# Patient Record
Sex: Female | Born: 1950
Health system: Southern US, Community
[De-identification: ages and names within clinical notes are randomized; demographics above are authoritative.]

## PROBLEM LIST (undated history)

## (undated) DIAGNOSIS — A86 Unspecified viral encephalitis: Secondary | ICD-10-CM

## (undated) DIAGNOSIS — R519 Headache, unspecified: Secondary | ICD-10-CM

## (undated) DIAGNOSIS — A879 Viral meningitis, unspecified: Secondary | ICD-10-CM

## (undated) DIAGNOSIS — R569 Unspecified convulsions: Secondary | ICD-10-CM

## (undated) DIAGNOSIS — I10 Essential (primary) hypertension: Secondary | ICD-10-CM

## (undated) DIAGNOSIS — R51 Headache: Secondary | ICD-10-CM

## (undated) HISTORY — PX: ABDOMINAL HYSTERECTOMY: SHX81

## (undated) HISTORY — DX: Viral meningitis, unspecified: A87.9

## (undated) HISTORY — DX: Unspecified viral encephalitis: A86

---

## 2013-01-23 ENCOUNTER — Encounter (HOSPITAL_BASED_OUTPATIENT_CLINIC_OR_DEPARTMENT_OTHER): Payer: Self-pay | Admitting: Emergency Medicine

## 2013-01-23 ENCOUNTER — Inpatient Hospital Stay (HOSPITAL_BASED_OUTPATIENT_CLINIC_OR_DEPARTMENT_OTHER)
Admission: EM | Admit: 2013-01-23 | Discharge: 2013-02-01 | DRG: 020 | Disposition: A | Discharge status: Home / Self Care | Payer: BC Managed Care – PPO | Attending: Internal Medicine | Admitting: Internal Medicine

## 2013-01-23 ENCOUNTER — Emergency Department (HOSPITAL_BASED_OUTPATIENT_CLINIC_OR_DEPARTMENT_OTHER): Payer: BC Managed Care – PPO

## 2013-01-23 DIAGNOSIS — R569 Unspecified convulsions: Secondary | ICD-10-CM | POA: Diagnosis present

## 2013-01-23 DIAGNOSIS — IMO0002 Reserved for concepts with insufficient information to code with codable children: Secondary | ICD-10-CM

## 2013-01-23 DIAGNOSIS — G030 Nonpyogenic meningitis: Secondary | ICD-10-CM

## 2013-01-23 DIAGNOSIS — K029 Dental caries, unspecified: Secondary | ICD-10-CM | POA: Diagnosis present

## 2013-01-23 DIAGNOSIS — G0490 Encephalitis and encephalomyelitis, unspecified: Principal | ICD-10-CM | POA: Diagnosis present

## 2013-01-23 DIAGNOSIS — A86 Unspecified viral encephalitis: Secondary | ICD-10-CM | POA: Diagnosis present

## 2013-01-23 DIAGNOSIS — E876 Hypokalemia: Secondary | ICD-10-CM | POA: Diagnosis present

## 2013-01-23 DIAGNOSIS — R509 Fever, unspecified: Secondary | ICD-10-CM | POA: Diagnosis present

## 2013-01-23 DIAGNOSIS — A879 Viral meningitis, unspecified: Secondary | ICD-10-CM

## 2013-01-23 DIAGNOSIS — R636 Underweight: Secondary | ICD-10-CM

## 2013-01-23 HISTORY — DX: Unspecified convulsions: R56.9

## 2013-01-23 HISTORY — DX: Headache: R51

## 2013-01-23 HISTORY — DX: Headache, unspecified: R51.9

## 2013-01-23 LAB — CBC WITH DIFFERENTIAL/PLATELET
Basophils Absolute: 0 10*3/uL (ref 0.0–0.1)
Eosinophils Absolute: 0 10*3/uL (ref 0.0–0.7)
Lymphs Abs: 1.5 10*3/uL (ref 0.7–4.0)
MCHC: 34.1 g/dL (ref 30.0–36.0)
MCV: 89.3 fL (ref 78.0–100.0)
Monocytes Relative: 10 % (ref 3–12)
Neutro Abs: 4.3 10*3/uL (ref 1.7–7.7)
Platelets: 288 10*3/uL (ref 150–400)
RDW: 13.5 % (ref 11.5–15.5)
WBC: 6.4 10*3/uL (ref 4.0–10.5)

## 2013-01-23 LAB — URINALYSIS, ROUTINE W REFLEX MICROSCOPIC
Bilirubin Urine: NEGATIVE
Specific Gravity, Urine: 1.043 — ABNORMAL HIGH (ref 1.005–1.030)
pH: 5.5 (ref 5.0–8.0)

## 2013-01-23 LAB — BASIC METABOLIC PANEL
BUN: 10 mg/dL (ref 6–23)
Calcium: 9.9 mg/dL (ref 8.4–10.5)
Creatinine, Ser: 0.5 mg/dL (ref 0.50–1.10)
GFR calc Af Amer: >90 mL/min (ref >90)
GFR calc non Af Amer: >90 mL/min (ref >90)
Glucose, Bld: 126 mg/dL — ABNORMAL HIGH (ref 70–99)
Potassium: 2.9 mEq/L — ABNORMAL LOW (ref 3.5–5.1)

## 2013-01-23 LAB — URINE MICROSCOPIC-ADD ON

## 2013-01-23 LAB — CG4 I-STAT (LACTIC ACID): Lactic Acid, Venous: 1.96 mmol/L (ref 0.5–2.2)

## 2013-01-23 MED ORDER — SODIUM CHLORIDE 0.9 % IV BOLUS (SEPSIS)
1000.0000 mL | Freq: Once | INTRAVENOUS | Status: AC
  Administered 2013-01-23: 1000 mL via INTRAVENOUS

## 2013-01-23 MED ORDER — LIDOCAINE-EPINEPHRINE 2 %-1:100000 IJ SOLN: 30.0000 mL | Freq: Once | INTRAMUSCULAR | Status: DC

## 2013-01-23 MED ORDER — ACETAMINOPHEN 160 MG/5ML PO SOLN
650.0000 mg | Freq: Once | ORAL | Status: AC
  Administered 2013-01-23: 650 mg via ORAL

## 2013-01-23 MED ORDER — ACETAMINOPHEN 325 MG PO TABS
650.0000 mg | ORAL_TABLET | Freq: Once | ORAL | Status: DC
  Filled 2013-01-23: qty 2

## 2013-01-23 MED ORDER — ACETAMINOPHEN 160 MG/5ML PO SOLN
ORAL | Status: AC
  Filled 2013-01-23: qty 20.3

## 2013-01-23 MED ORDER — DEXTROSE 5 % IV SOLN
2.0000 g | Freq: Once | INTRAVENOUS | Status: AC
  Administered 2013-01-23: 2 g via INTRAVENOUS
  Filled 2013-01-23 (×2): qty 2

## 2013-01-23 MED ORDER — LIDOCAINE-EPINEPHRINE 2 %-1:100000 IJ SOLN
INTRAMUSCULAR | Status: AC
  Filled 2013-01-23: qty 1

## 2013-01-23 NOTE — ED Provider Notes (Signed)
CSN: [id number]     Arrival date & time 01/23/13  2052 History  This chart was scribed for Hilario Quarry, MD by Valera Castle, ED scribe. This patient was seen in room MH08/MH08 and the patient's care was started at 10:15 PM.   Chief Complaint  Patient presents with  . Fever    Patient is a 62 y.o. female presenting with fever. The history is provided by the patient and a relative. No language interpreter was used.  Fever Max temp prior to arrival:  103 Onset quality:  Sudden Duration:  1 day Timing:  Constant Chronicity:  New Associated symptoms: diarrhea and nausea   Associated symptoms: no congestion, no cough, no ear pain, no sore throat and no vomiting    HPI Comments: Taylor Wood is a 62 y.o. female who presents to the Emergency Department complaining of sudden, moderate, constant fever, onset 1 day ago, with a max temperature of 103. She reports associated headache, nausea, and diarrhea. She has a h/o of headaches, but states that the pain has increased since the fever. States she has been eating and drinking. Her daughter reports that she had a new onset of  seizure last Friday, and was seen at [facility].  She was admitted to the hospital.  She did not have a fever at that time.   She states they performed a CT, MRI, and other neuro tests. She denies the pt having a catheter, or a lumbar puncture. She was started on keppra.  She denies being in contact with anyone who was sick. She denies cough, emesis, sinus congestion, sore throat, ear pain, and any other associated symptoms. Pt states she has not been having her periods. She denies diabetes, and any other medical history. She has no known allergies, and does not smoke, drink. She does not have a PCP.    History reviewed. No pertinent past medical history. History reviewed. No pertinent past surgical history. No family history on file. History  Substance Use Topics  . Smoking status: Never Smoker   . Smokeless tobacco: Not on file  .  Alcohol Use: No   OB History   Grav Para Term Preterm Abortions TAB SAB Ect Mult Living                 Review of Systems  Constitutional: Positive for fever.  HENT: Negative for ear pain, congestion and sore throat.   Respiratory: Negative for cough.   Gastrointestinal: Positive for nausea and diarrhea. Negative for vomiting.  All other systems reviewed and are negative.    Allergies  Review of patient's allergies indicates no known allergies.  Home Medications   Current Outpatient Rx  Name  Route  Sig  Dispense  Refill  . acetaminophen (TYLENOL) 325 MG tablet   Oral   Take 650 mg by mouth once.         . levETIRAcetam (KEPPRA) 750 MG tablet   Oral   Take 750 mg by mouth every 12 (twelve) hours.          Triage Vitals: BP 152/70  Pulse 103  Temp(Src) 103.1 F (39.5 C) (Oral)  Resp 18  Ht 5' (1.524 m)  Wt 99 lb 1.6 oz (44.951 kg)  BMI 19.35 kg/m2  SpO2 98%  Physical Exam  Nursing note and vitals reviewed. Constitutional: She is oriented to person, place, and time. She appears well-developed and well-nourished. No distress.  HENT:  Head: Normocephalic and atraumatic.  Right Ear: Tympanic membrane and external  ear normal.  Left Ear: Tympanic membrane and external ear normal.  Nose: Nose normal. Right sinus exhibits no maxillary sinus tenderness and no frontal sinus tenderness. Left sinus exhibits no maxillary sinus tenderness and no frontal sinus tenderness.  Eyes: Conjunctivae and EOM are normal. Pupils are equal, round, and reactive to light. Right eye exhibits no nystagmus. Left eye exhibits no nystagmus.  Neck: Normal range of motion. Neck supple. No tracheal deviation present. No thyromegaly present.  Cardiovascular: Normal heart sounds and intact distal pulses.  Tachycardia present.   Pulmonary/Chest: Effort normal and breath sounds normal. No respiratory distress. She exhibits no tenderness.  Abdominal: Soft. Bowel sounds are normal. She exhibits no  distension and no mass. There is no tenderness.  Musculoskeletal: Normal range of motion. She exhibits no edema and no tenderness.  Neurological: She is alert and oriented to person, place, and time. She has normal strength and normal reflexes. No sensory deficit. She displays a negative Romberg sign. GCS eye subscore is 4. GCS verbal subscore is 5. GCS motor subscore is 6.  Reflex Scores:      Tricep reflexes are 2+ on the right side and 2+ on the left side.      Bicep reflexes are 2+ on the right side and 2+ on the left side.      Brachioradialis reflexes are 2+ on the right side and 2+ on the left side.      Patellar reflexes are 2+ on the right side and 2+ on the left side.      Achilles reflexes are 2+ on the right side and 2+ on the left side. Patient with normal gait without ataxia, shuffling, spasm, or antalgia. Speech is normal without dysarthria, dysphasia, or aphasia. Muscle strength is 5/5 in bilateral shoulders, elbow flexor and extensors, wrist flexor and extensors, and intrinsic hand muscles. 5/5 bilateral lower extremity hip flexors, extensors, knee flexors and extensors, and ankle dorsi and plantar flexors.    Skin: Skin is warm and dry. No rash noted.  Psychiatric: She has a normal mood and affect. Her behavior is normal. Judgment and thought content normal.    ED Course  LUMBAR PUNCTURE Date/Time: 01/23/2013 11:44 PM Performed by: Hilario Quarry Authorized by: Hilario Quarry Consent: written consent obtained. Risks and benefits: risks, benefits and alternatives were discussed Consent given by: patient Patient understanding: patient states understanding of the procedure being performed Patient consent: the patient's understanding of the procedure matches consent given Patient identity confirmed: verbally with patient Time out: Immediately prior to procedure a "time out" was called to verify the correct patient, procedure, equipment, support staff and site/side marked as  required. Indications: evaluation for infection Anesthesia: local infiltration Local anesthetic: lidocaine 1% with epinephrine Patient sedated: no Preparation: Patient was prepped and draped in the usual sterile fashion. Lumbar space: L3-L4 interspace Patient's position: left lateral decubitus Needle gauge: 22 Needle type: spinal needle - Quincke tip Needle length: 3.5 in Number of attempts: 2 Fluid appearance: clear Tubes of fluid: 4 Total volume: 8 ml Post-procedure: site cleaned Patient tolerance: Patient tolerated the procedure well with no immediate complications.   (including critical care time)  DIAGNOSTIC STUDIES: Oxygen Saturation is 98% on room air, normal by my interpretation.    COORDINATION OF CARE: 10:25 PM-Discussed treatment plan which includes UA, and tylenol with pt at bedside and pt agreed to plan.      Labs Review Labs Reviewed  URINALYSIS, ROUTINE W REFLEX MICROSCOPIC - Abnormal; Notable for the following:  Specific Gravity, Urine 1.043 (*)    Hgb urine dipstick SMALL (*)    Protein, ur 30 (*)    Leukocytes, UA SMALL (*)    All other components within normal limits  URINE MICROSCOPIC-ADD ON   Imaging Review No results found.  MDM  No diagnosis found. Patient to have work up for fever including lp.  Patient treated with ns and rocephin 2 gram iv.  Patient with first lactic acid normal.  Discussed with Dr. Judd Lien and he will follow up labs and csf results.  Patient and family advised.     Hilario Quarry, MD 01/23/13 [phone]

## 2013-01-23 NOTE — ED Notes (Signed)
MD at bedside giving bedside report to oncoming MD.

## 2013-01-23 NOTE — ED Notes (Signed)
Family states pt with fever, nausea, and diarrhea today. Family states pt had a seizure last fri (NO hx of seizures) was seen at [facility] for that.

## 2013-01-24 ENCOUNTER — Encounter (HOSPITAL_COMMUNITY): Payer: Self-pay | Admitting: *Deleted

## 2013-01-24 DIAGNOSIS — A86 Unspecified viral encephalitis: Secondary | ICD-10-CM | POA: Diagnosis present

## 2013-01-24 DIAGNOSIS — E876 Hypokalemia: Secondary | ICD-10-CM | POA: Diagnosis present

## 2013-01-24 DIAGNOSIS — R569 Unspecified convulsions: Secondary | ICD-10-CM | POA: Diagnosis present

## 2013-01-24 DIAGNOSIS — A89 Unspecified viral infection of central nervous system: Secondary | ICD-10-CM

## 2013-01-24 LAB — CSF CELL COUNT WITH DIFFERENTIAL
Eosinophils, CSF: 0 % (ref 0–1)
Lymphs, CSF: 74 % (ref 40–80)
Lymphs, CSF: 78 % (ref 40–80)
Monocyte-Macrophage-Spinal Fluid: 20 % (ref 15–45)
RBC Count, CSF: 2 /mm3 — ABNORMAL HIGH
Segmented Neutrophils-CSF: 6 % (ref 0–6)
Tube #: 4
WBC, CSF: 163 /mm3 (ref 0–5)

## 2013-01-24 LAB — HERPES SIMPLEX VIRUS(HSV) DNA BY PCR
HSV 1 DNA: NOT DETECTED
HSV 2 DNA: NOT DETECTED

## 2013-01-24 LAB — PROTEIN AND GLUCOSE, CSF: Total  Protein, CSF: 78 mg/dL — ABNORMAL HIGH (ref 15–45)

## 2013-01-24 LAB — PATHOLOGIST SMEAR REVIEW

## 2013-01-24 MED ORDER — LEVETIRACETAM 100 MG/ML PO SOLN
750.0000 mg | Freq: Two times a day (BID) | ORAL | Status: DC
  Administered 2013-01-24 – 2013-02-01 (×17): 750 mg via ORAL
  Filled 2013-01-24 (×19): qty 7.5

## 2013-01-24 MED ORDER — LEVETIRACETAM 750 MG PO TABS
750.0000 mg | ORAL_TABLET | Freq: Two times a day (BID) | ORAL | Status: DC
  Filled 2013-01-24 (×3): qty 1

## 2013-01-24 MED ORDER — DEXTROSE 5 % IV SOLN
5.0000 mg/kg | Freq: Once | INTRAVENOUS | Status: AC
  Administered 2013-01-24: 225 mg via INTRAVENOUS
  Filled 2013-01-24: qty 10

## 2013-01-24 MED ORDER — SODIUM CHLORIDE 0.9 % IV SOLN: INTRAVENOUS | Status: AC

## 2013-01-24 MED ORDER — POTASSIUM CHLORIDE 20 MEQ/15ML (10%) PO LIQD
40.0000 meq | Freq: Once | ORAL | Status: AC
  Administered 2013-01-24: 16:00:00 40 meq via ORAL
  Filled 2013-01-24: qty 30

## 2013-01-24 MED ORDER — ACETAMINOPHEN 325 MG PO TABS
650.0000 mg | ORAL_TABLET | Freq: Four times a day (QID) | ORAL | Status: DC | PRN
  Administered 2013-01-24: 09:00:00 650 mg via ORAL
  Filled 2013-01-24: qty 2

## 2013-01-24 MED ORDER — IBUPROFEN 200 MG PO TABS
200.0000 mg | ORAL_TABLET | Freq: Four times a day (QID) | ORAL | Status: DC | PRN
  Administered 2013-01-24 – 2013-01-25 (×2): 200 mg via ORAL
  Filled 2013-01-24 (×4): qty 1

## 2013-01-24 MED ORDER — IBUPROFEN 100 MG/5ML PO SUSP
600.0000 mg | Freq: Once | ORAL | Status: AC
  Administered 2013-01-24: 600 mg via ORAL
  Filled 2013-01-24: qty 30

## 2013-01-24 MED ORDER — ACETAMINOPHEN 160 MG/5ML PO SOLN
650.0000 mg | Freq: Four times a day (QID) | ORAL | Status: DC | PRN
  Administered 2013-01-24 – 2013-02-01 (×16): 650 mg via ORAL
  Filled 2013-01-24 (×15): qty 20.3

## 2013-01-24 MED ORDER — POTASSIUM CHLORIDE 10 MEQ/100ML IV SOLN
10.0000 meq | Freq: Once | INTRAVENOUS | Status: DC
  Filled 2013-01-24: qty 100

## 2013-01-24 MED ORDER — POTASSIUM CHLORIDE CRYS ER 20 MEQ PO TBCR: 40.0000 meq | EXTENDED_RELEASE_TABLET | Freq: Once | ORAL | Status: DC

## 2013-01-24 MED ORDER — HEPARIN SODIUM (PORCINE) 5000 UNIT/ML IJ SOLN
5000.0000 [IU] | Freq: Three times a day (TID) | INTRAMUSCULAR | Status: DC
  Administered 2013-01-24 – 2013-02-01 (×24): 5000 [IU] via SUBCUTANEOUS
  Filled 2013-01-24 (×28): qty 1

## 2013-01-24 MED ORDER — DEXTROSE 5 % IV SOLN
10.0000 mg/kg | Freq: Three times a day (TID) | INTRAVENOUS | Status: DC
  Administered 2013-01-24 – 2013-01-25 (×4): 460 mg via INTRAVENOUS
  Filled 2013-01-24 (×5): qty 9.2

## 2013-01-24 NOTE — ED Provider Notes (Signed)
Care syndrome Dr. Rosalia Hammers at shift change. Patient initially presented here with headache and fever. Febrile workup was initiated including blood cultures, urine cultures, and chest x-ray. These tests thus far have been unremarkable. An LP was performed and the patient was signed out to me awaiting these results. Spinal fluid returned showing a white count of 163 suggestive of a Viral meningitis. There were no organisms on the Gram stain and differential did not suggest a bacterial etiology. I discussed the results of these tests with Dr. Amada Jupiter from neurology who is recommending admission for IV acyclovir and observation. I have discussed the case with Dr. Julian Reil from [facility] who agrees to admit. She will be transferred to come for inpatient care and further evaluation. I have updated the patient and family on the results of these tests in the disposition.  Geoffery Lyons, MD 01/24/13 [phone]

## 2013-01-24 NOTE — ED Notes (Signed)
MD at bedside. 

## 2013-01-24 NOTE — Progress Notes (Signed)
Patient seen earlier this am by my associate. Please refer to his HPI for details regarding Assessment and Plan.    Will reassess next am. Patient seen and had no new complaints.  Bernie Ransford, [employer]

## 2013-01-24 NOTE — ED Notes (Signed)
Dr. Judd Lien is aware of critical CSF results.

## 2013-01-24 NOTE — Consult Note (Signed)
NEURO HOSPITALIST CONSULT NOTE    Reason for Consult: Meningitis and eizure  HPI:                                                                                                                                          Taylor Wood is an 62 y.o. female who presented to [place] complaining of fever. The patient recently was admitted to [facility] after a new onset seizure last Friday, at that time she did have low grade fevers but no signs of meningitis or infection. She was started on keppra and reportedly had a negative EEG and  MRI. Patient was brought back to hospital after noting elevated temperature and HA. Temperature is reordered as high as 103.1. CSF findings in ED show glucose 68, protein 78, RBC 8,WBC (108,163), neutrophils (6,8), Lymphs (74,78), Monocytes (20,14), no eosinophils. Both CSF and blood cultures are pending. PAtient has been started on IV Acyclovir per pharmacy. She is is currently on Keppra 750 mg BID with no further seizures.   Patients daughter does have seizure disorder since 62 YO ( no history of trauma, normal vaginal birth and no febrile seizure). Patient herself has no family history of seizure, was normal vaginal birth and no history of febrile seizure.   Currently patient is Afebrile, awake, has no HA or meningismus.   Past Medical History  Diagnosis Date  . Seizure   . HA (headache)     History reviewed. No pertinent past surgical history.  Family History  Problem Relation Age of Onset  . Hypertension Mother   . Hypertension Father     Social History:  reports that she has never smoked. She does not have any smokeless tobacco history on file. She reports that she does not drink alcohol or use illicit drugs.  No Known Allergies  MEDICATIONS:                                                                                                                     Scheduled: . sodium chloride   Intravenous STAT  . acyclovir  10 mg/kg Intravenous  Q8H  . heparin  5,000 Units Subcutaneous Q8H  . levETIRAcetam  750 mg Oral BID  . lidocaine-EPINEPHrine  30 mL Other Once  . potassium chloride  10 mEq Intravenous Once     ROS:                                                                                                                                       History obtained from the patient  General ROS: negative for - chills, fatigue, fever, night sweats, weight gain or weight loss Psychological ROS: negative for - behavioral disorder, hallucinations, memory difficulties, mood swings or suicidal ideation Ophthalmic ROS: negative for - blurry vision, double vision, eye pain or loss of vision ENT ROS: negative for - epistaxis, nasal discharge, oral lesions, sore throat, tinnitus or vertigo Allergy and Immunology ROS: negative for - hives or itchy/watery eyes Hematological and Lymphatic ROS: negative for - bleeding problems, bruising or swollen lymph nodes Endocrine ROS: negative for - galactorrhea, hair pattern changes, polydipsia/polyuria or temperature intolerance Respiratory ROS: negative for - cough, hemoptysis, shortness of breath or wheezing Cardiovascular ROS: negative for - chest pain, dyspnea on exertion, edema or irregular heartbeat Gastrointestinal ROS: negative for - abdominal pain, diarrhea, hematemesis, nausea/vomiting or stool incontinence Genito-Urinary ROS: negative for - dysuria, hematuria, incontinence or urinary frequency/urgency Musculoskeletal ROS: negative for - joint swelling or muscular weakness Neurological ROS: as noted in HPI Dermatological ROS: negative for rash and skin lesion changes   Blood pressure 137/74, pulse 87, temperature 99.5 F (37.5 C), temperature source Oral, resp. rate 18, height 5' (1.524 m), weight 45.8 kg (100 lb 15.5 oz), SpO2 100.00%.   Neurologic Examination:                                                                                                      Mental Status: Alert,  oriented, thought content appropriate.  Speech fluent without evidence of aphasia.  Able to follow 3 step commands without difficulty. Cranial Nerves: II: Discs flat bilaterally; Visual fields grossly normal, pupils equal, round, reactive to light and accommodation III,IV, VI: ptosis not present, extra-ocular motions intact bilaterally V,VII: smile symmetric, facial light touch sensation normal bilaterally VIII: hearing normal bilaterally IX,X: gag reflex present XI: bilateral shoulder shrug XII: midline tongue extension Motor: Right : Upper extremity   5/5    Left:     Upper extremity   5/5  Lower extremity   5/5     Lower extremity   5/5 Tone and bulk:normal tone throughout; no atrophy noted Sensory: Pinprick and light touch intact throughout, bilaterally Deep Tendon Reflexes:  Right: Upper Extremity   Left: Upper extremity  biceps (C-5 to C-6) 2/4   biceps (C-5 to C-6) 2/4 tricep (C7) 2/4    triceps (C7) 2/4 Brachioradialis (C6) 2/4  Brachioradialis (C6) 2/4  Lower Extremity Lower Extremity  quadriceps (L-2 to L-4) 1/4   quadriceps (L-2 to L-4) 1/4 Achilles (S1) 1/4   Achilles (S1) 1/4  Plantars: Right: downgoing   Left: downgoing Cerebellar: normal finger-to-nose,  normal heel-to-shin test Gait: not assessed CV: pulses palpable throughout    No components found with this basename: cbc,  bmp,  coags,  chol,  tri,  ldl,  hga1c    Results for orders placed during the hospital encounter of 01/23/13 (from the past 48 hour(s))  URINALYSIS, ROUTINE W REFLEX MICROSCOPIC     Status: Abnormal   Collection Time    01/23/13  9:07 PM      Result Value Range   Color, Urine YELLOW  YELLOW   APPearance CLEAR  CLEAR   Specific Gravity, Urine 1.043 (*) 1.005 - 1.030   pH 5.5  5.0 - 8.0   Glucose, UA NEGATIVE  NEGATIVE mg/dL   Hgb urine dipstick SMALL (*) NEGATIVE   Bilirubin Urine NEGATIVE  NEGATIVE   Ketones, ur NEGATIVE  NEGATIVE mg/dL   Protein, ur 30 (*) NEGATIVE mg/dL    Urobilinogen, UA 0.2  0.0 - 1.0 mg/dL   Nitrite NEGATIVE  NEGATIVE   Leukocytes, UA SMALL (*) NEGATIVE  URINE MICROSCOPIC-ADD ON     Status: None   Collection Time    01/23/13  9:07 PM      Result Value Range   Squamous Epithelial / LPF RARE  RARE   WBC, UA 3-6  <3 WBC/hpf   RBC / HPF 3-6  <3 RBC/hpf   Bacteria, UA RARE  RARE   Urine-Other MUCOUS PRESENT    CBC WITH DIFFERENTIAL     Status: None   Collection Time    01/23/13 11:22 PM      Result Value Range   WBC 6.4  4.0 - 10.5 K/uL   Comment: WHITE COUNT CONFIRMED ON SMEAR   RBC 4.13  3.87 - 5.11 MIL/uL   Hemoglobin 12.6  12.0 - 15.0 g/dL   HCT 16.1  09.6 - 04.5 %   MCV 89.3  78.0 - 100.0 fL   MCH 30.5  26.0 - 34.0 pg   MCHC 34.1  30.0 - 36.0 g/dL   RDW 40.9  81.1 - 91.4 %   Platelets 288  150 - 400 K/uL   Comment: SPECIMEN CHECKED FOR CLOTS     PLATELET COUNT CONFIRMED BY SMEAR   Neutrophils Relative % 67  43 - 77 %   Lymphocytes Relative 23  12 - 46 %   Monocytes Relative 10  3 - 12 %   Eosinophils Relative 0  0 - 5 %   Basophils Relative 0  0 - 1 %   Neutro Abs 4.3  1.7 - 7.7 K/uL   Lymphs Abs 1.5  0.7 - 4.0 K/uL   Monocytes Absolute 0.6  0.1 - 1.0 K/uL   Eosinophils Absolute 0.0  0.0 - 0.7 K/uL   Basophils Absolute 0.0  0.0 - 0.1 K/uL   WBC Morphology WHITE COUNT CONFIRMED ON SMEAR     Smear Review LARGE PLATELETS PRESENT     Comment: PLATELET COUNT CONFIRMED BY SMEAR  BASIC METABOLIC PANEL     Status: Abnormal   Collection Time    01/23/13 11:22 PM      Result Value Range  Sodium 136  135 - 145 mEq/L   Potassium 2.9 (*) 3.5 - 5.1 mEq/L   Chloride 97  96 - 112 mEq/L   CO2 25  19 - 32 mEq/L   Glucose, Bld 126 (*) 70 - 99 mg/dL   BUN 10  6 - 23 mg/dL   Creatinine, Ser 1.61  0.50 - 1.10 mg/dL   Calcium 9.9  8.4 - 09.6 mg/dL   GFR calc non Af Amer >90  >90 mL/min   GFR calc Af Amer >90  >90 mL/min   Comment: (NOTE)     The eGFR has been calculated using the CKD EPI equation.     This calculation has not  been validated in all clinical situations.     eGFR's persistently <90 mL/min signify possible Chronic Kidney     Disease.  CSF CELL COUNT WITH DIFFERENTIAL     Status: Abnormal   Collection Time    01/23/13 11:30 PM      Result Value Range   Tube # 1     Color, CSF COLORLESS  COLORLESS   Appearance, CSF CLEAR  CLEAR   Supernatant NOT INDICATED     RBC Count, CSF 8 (*) 0 /cu mm   WBC, CSF 163 (*) 0 - 5 /cu mm   Comment: CRITICAL RESULT CALLED TO, READ BACK BY AND VERIFIED WITH:     Talmage Nap RN [phone] [address], S   Segmented Neutrophils-CSF 6  0 - 6 %   Lymphs, CSF 74  40 - 80 %   Monocyte-Macrophage-Spinal Fluid 20  15 - 45 %   Eosinophils, CSF 0  0 - 1 %   Comment: Performed at [facility]  CSF CULTURE     Status: None   Collection Time    01/23/13 11:30 PM      Result Value Range   Specimen Description CSF     Special Requests Normal     Gram Stain       Value: WBC PRESENT, PREDOMINANTLY MONONUCLEAR     NO ORGANISMS SEEN     CYTOSPIN     Performed at [facility]   Culture PENDING     Report Status PENDING    PROTEIN AND GLUCOSE, CSF     Status: Abnormal   Collection Time    01/23/13 11:30 PM      Result Value Range   Glucose, CSF 68  43 - 76 mg/dL   Total  Protein, CSF 78 (*) 15 - 45 mg/dL  CSF CELL COUNT WITH DIFFERENTIAL     Status: Abnormal   Collection Time    01/23/13 11:30 PM      Result Value Range   Tube # 4     Color, CSF COLORLESS  COLORLESS   Appearance, CSF CLEAR  CLEAR   Supernatant NOT INDICATED     RBC Count, CSF 2 (*) 0 /cu mm   WBC, CSF 108 (*) 0 - 5 /cu mm   Comment: CRITICAL RESULT CALLED TO, READ BACK BY AND VERIFIED WITH:     Talmage Nap RN [phone] [address], S   Segmented Neutrophils-CSF 8 (*) 0 - 6 %   Lymphs, CSF 78  40 - 80 %   Monocyte-Macrophage-Spinal Fluid 14 (*) 15 - 45 %   Eosinophils, CSF 0  0 - 1 %   Comment: Performed at [facility]  CG4 I-STAT (LACTIC ACID)     Status: None  Collection Time    01/23/13 11:35 PM      Result Value Range   Lactic Acid, Venous 1.96  0.5 - 2.2 mmol/L    Dg Chest 2 View  01/24/2013   CLINICAL DATA:  Headache.  EXAM: CHEST  2 VIEW  COMPARISON:  CHEST x-ray 01/19/2013.  FINDINGS: Lung volumes are normal. No consolidative airspace disease. No pleural effusions. No pneumothorax. No pulmonary nodule or mass noted. Pulmonary vasculature and the cardiomediastinal silhouette are within normal limits. Atherosclerosis in the thoracic aorta.  IMPRESSION: 1.  No radiographic evidence of acute cardiopulmonary disease. 2. Atherosclerosis.   Electronically Signed   By: Trudie Reed M.D.   On: 01/24/2013 01:03     Assessment/Plan: 62 YO female with single new onset seizure 6 days ago now presenting with likely viral meningitis by LP results.  Patient has had no further seizure while being on Keppra. She is currently  On Acyclovir IV per pharmacy and remains to have a LG fever. Exam is nonfocal and shows no signs of meningismus. Both blood and CSF cultures pending (preliminary shows no growth).   Recommend: 1) Continue Keppra 750 mg BID 2) Continue Acyclovir  Assessment and plan discussed with with attending physician and they are in agreement.    Felicie Morn PA-C [facility] [phone]  01/24/2013, 9:26 AM  I personally participate in this patient's evaluation and management including formulating the above clinical assessment and management recommendations.  Venetia Maxon M.D. [facility] [phone]

## 2013-01-24 NOTE — Plan of Care (Signed)
62 yo F with seizure on Friday, was at [place], work up including EEG and MRI was negative.  Spiked fever today, got LP in ED and appears to be a viral meningitis, Dr. Amada Jupiter recommended acyclovir and inpatient stay for a few days in case this was HSV.  Accepted to floor.

## 2013-01-24 NOTE — Progress Notes (Signed)
61yo female c/o HA and fever, LP performed, returned w/ WBC 163, concerning for viral meningitis, to begin IV acyclovir.  Rec'd acyclovir 5mg /kg in ED; will start acyclovir 10mg /kg Q8H for CrCl ~55 ml/min and monitor.  Vernard Gambles, PharmD, BCPS 01/24/2013 6:11 AM

## 2013-01-24 NOTE — H&P (Signed)
[facility] History and Physical  Taylor Wood JXB:[id number] DOB: 05/16/1950 DOA: 01/23/2013  Referring physician: ED PCP: No PCP Per Patient   Chief Complaint: Fever  HPI: Clovis Warwick is a 62 y.o. female who presents to the ED complaining of fever.  The patient was initially seen and admitted to [facility] after a seizure last Friday (no PMH of seizures before this), she did have low grade fevers during that hospital stay but nothing outstanding.  She was started on keppra during that hospital stay.  Work up during that hospital stay included a reportedly negative EEG and negative MRI.  Today she was noted to have a fever as high as 103.1.  There is associated headache, nausea, and some diarrhea.  Headache was worse with the fever (and resolved now that fever has resolved).  There is NO meningismus despite the CSF findings in the ED.  In the ED LP was c/w viral meningitis (despite no meningismus) as the likely source of her fever.  Patient was started on HSV for possible viral encephalitis and transferred to [facility].  Review of Systems: Denies tick bites, denies rash, 12 systems reviewed and otherwise negative.  History reviewed. No pertinent past medical history. History reviewed. No pertinent past surgical history. Social History:  reports that she has never smoked. She does not have any smokeless tobacco history on file. She reports that she does not drink alcohol or use illicit drugs.  No Known Allergies  History reviewed. No pertinent family history.  Prior to Admission medications   Medication Sig Start Date End Date Taking? Authorizing Provider  acetaminophen (TYLENOL) 325 MG tablet Take 650 mg by mouth once.   Yes Historical Provider, MD  levETIRAcetam (KEPPRA) 750 MG tablet Take 750 mg by mouth every 12 (twelve) hours.   Yes Historical Provider, MD   Physical Exam: Filed Vitals:   01/24/13 0540  BP: 137/74  Pulse: 87  Temp: 99.5 F (37.5 C)  Resp: 18    General:  NAD, resting  comfortably in bed Eyes: PEERLA EOMI ENT: mucous membranes moist Neck: supple w/o JVD, no meningismus Cardiovascular: RRR w/o MRG Respiratory: CTA B Abdomen: soft, nt, nd, bs+ Skin: no rash nor lesion Musculoskeletal: MAE, full ROM all 4 extremities Psychiatric: normal tone and affect Neurologic: AAOx3, grossly non-focal  Labs on Admission:  Basic Metabolic Panel:  Recent Labs Lab 01/23/13 2322  NA 136  K 2.9*  CL 97  CO2 25  GLUCOSE 126*  BUN 10  CREATININE 0.50  CALCIUM 9.9   Liver Function Tests: No results found for this basename: AST, ALT, ALKPHOS, BILITOT, PROT, ALBUMIN,  in the last 168 hours No results found for this basename: LIPASE, AMYLASE,  in the last 168 hours No results found for this basename: AMMONIA,  in the last 168 hours CBC:  Recent Labs Lab 01/23/13 2322  WBC 6.4  NEUTROABS 4.3  HGB 12.6  HCT 36.9  MCV 89.3  PLT 288   Cardiac Enzymes: No results found for this basename: CKTOTAL, CKMB, CKMBINDEX, TROPONINI,  in the last 168 hours  BNP (last 3 results) No results found for this basename: PROBNP,  in the last 8760 hours CBG: No results found for this basename: GLUCAP,  in the last 168 hours  Radiological Exams on Admission: Dg Chest 2 View  01/24/2013   CLINICAL DATA:  Headache.  EXAM: CHEST  2 VIEW  COMPARISON:  CHEST x-ray 01/19/2013.  FINDINGS: Lung volumes are normal. No consolidative airspace disease. No pleural effusions.  No pneumothorax. No pulmonary nodule or mass noted. Pulmonary vasculature and the cardiomediastinal silhouette are within normal limits. Atherosclerosis in the thoracic aorta.  IMPRESSION: 1.  No radiographic evidence of acute cardiopulmonary disease. 2. Atherosclerosis.   Electronically Signed   By: Trudie Reed M.D.   On: 01/24/2013 01:03    EKG: Independently reviewed.  Assessment/Plan Principal Problem:   Viral encephalitis Active Problems:   Seizures   1. Viral encephalitis - patient actually denies  headache and denies meningismus at this time, LP was most c/w viral pathology with only mildly elevated protein, and mildly elevated WBC with a clear lymphocyte predominance (only 8% segs).  Will continue Acyclovir pharm to dose in case this is HSV, have no evidence or suspicion at this point of bacterial meningitis (patient has no meningismus, there is a 6 day course of the illness and patient appears non-toxic at this time, the LP does not really support typical bacterial infectious pathology).  Could also be any number of other viruses other than HSV that is causing this (ie enterovirus, WNV or other arbovirus although NOT EEE as she is no where near sick enough).  Will continue to provide supportive care in addition to acyclovir and monitor patient. 2. Seizures - leaving patient on keppra during acute illness, unlikely to require this permanently as her MRI, EEG were both negative and only 1 seizure episode.    Code Status: 12 systems reviewed and otherwise negative (must indicate code status--if unknown or must be presumed, indicate so) Family Communication: Spoke with family at bedside (indicate person spoken with, if applicable, with phone number if by telephone) Disposition Plan: Admit to inpatient (indicate anticipated LOS)  Time spent: 70 min  GARDNER, JARED M. [facility] Pager [phone]  If 7PM-7AM, please contact night-coverage [email or web address] Password [facility] 01/24/2013, 6:43 AM

## 2013-01-24 NOTE — ED Notes (Addendum)
Pt lost consciousness while in radiology having cxr done.  Was caught by staff so she did not fall.  When pt placed back on the stretcher she woke up and at this time is back in the room and states she feels fine.  MD aware of LOC episode.

## 2013-01-25 DIAGNOSIS — E876 Hypokalemia: Secondary | ICD-10-CM

## 2013-01-25 DIAGNOSIS — A879 Viral meningitis, unspecified: Secondary | ICD-10-CM

## 2013-01-25 LAB — BASIC METABOLIC PANEL
BUN: 6 mg/dL (ref 6–23)
Chloride: 100 mEq/L (ref 96–112)
GFR calc Af Amer: >90 mL/min (ref >90)
Glucose, Bld: 113 mg/dL — ABNORMAL HIGH (ref 70–99)
Potassium: 3.2 mEq/L — ABNORMAL LOW (ref 3.5–5.1)

## 2013-01-25 LAB — CBC
HCT: 32 % — ABNORMAL LOW (ref 36.0–46.0)
Hemoglobin: 10.9 g/dL — ABNORMAL LOW (ref 12.0–15.0)
WBC: 4.8 10*3/uL (ref 4.0–10.5)

## 2013-01-25 LAB — MAGNESIUM: Magnesium: 2.1 mg/dL (ref 1.5–2.5)

## 2013-01-25 MED ORDER — IBUPROFEN 100 MG/5ML PO SUSP
200.0000 mg | Freq: Four times a day (QID) | ORAL | Status: DC | PRN
  Administered 2013-01-25 – 2013-01-26 (×4): 200 mg via ORAL
  Filled 2013-01-25 (×4): qty 10

## 2013-01-25 MED ORDER — PNEUMOCOCCAL VAC POLYVALENT 25 MCG/0.5ML IJ INJ: 0.5000 mL | INJECTION | INTRAMUSCULAR | Status: DC

## 2013-01-25 MED ORDER — PNEUMOCOCCAL VAC POLYVALENT 25 MCG/0.5ML IJ INJ
0.5000 mL | INJECTION | INTRAMUSCULAR | Status: DC
  Filled 2013-01-25: qty 0.5

## 2013-01-25 MED ORDER — POTASSIUM CHLORIDE CRYS ER 20 MEQ PO TBCR
40.0000 meq | EXTENDED_RELEASE_TABLET | Freq: Once | ORAL | Status: AC
  Administered 2013-01-25: 40 meq via ORAL
  Filled 2013-01-25: qty 2

## 2013-01-25 MED ORDER — INFLUENZA VAC SPLIT QUAD 0.5 ML IM SUSP: 0.5000 mL | INTRAMUSCULAR | Status: DC

## 2013-01-25 MED ORDER — INFLUENZA VAC SPLIT QUAD 0.5 ML IM SUSP
0.5000 mL | Freq: Once | INTRAMUSCULAR | Status: DC
  Filled 2013-01-25: qty 0.5

## 2013-01-25 NOTE — Progress Notes (Addendum)
NEURO HOSPITALIST PROGRESS NOTE   SUBJECTIVE:                                                                                                                        Patient has slight HA and is associated with elevated Temperature.  Otherwise no further issues and no further seizures.   OBJECTIVE:                                                                                                                           Vital signs in last 24 hours: Temp:  [99.5 F (37.5 C)-103.1 F (39.5 C)] 101.1 F (38.4 C) (09/19 0512) Pulse Rate:  [86-99] 86 (09/19 0512) Resp:  [16-18] 16 (09/19 0512) BP: (102-126)/(38-77) 116/42 mmHg (09/19 0512) SpO2:  [98 %-99 %] 99 % (09/19 0512) Weight:  [46.8 kg (103 lb 2.8 oz)] 46.8 kg (103 lb 2.8 oz) (09/18 2035)  Intake/Output from previous day: 09/18 0701 - 09/19 0700 In: 940 [P.O.:940] Out: -  Intake/Output this shift:   Nutritional status: General  Past Medical History  Diagnosis Date  . Seizure   . HA (headache)      Neurologic Exam:  Mental Status: Alert, oriented, thought content appropriate.  Speech fluent without evidence of aphasia.  Able to follow 3 step commands without difficulty. Cranial Nerves: II: Visual fields grossly normal, pupils equal, round, reactive to light and accommodation III,IV, VI: ptosis not present, extra-ocular motions intact bilaterally V,VII: smile symmetric, facial light touch sensation normal bilaterally VIII: hearing normal bilaterally IX,X: gag reflex present XI: bilateral shoulder shrug XII: midline tongue extension Motor: Right : Upper extremity   5/5    Left:     Upper extremity   5/5  Lower extremity   5/5     Lower extremity   5/5 Tone and bulk:normal tone throughout; no atrophy noted Sensory: Pinprick and light touch intact throughout, bilaterally Deep Tendon Reflexes:  Right: Upper Extremity   Left: Upper extremity   biceps (C-5 to C-6) 2/4   biceps (C-5  to C-6) 2/4 tricep (C7) 2/4    triceps (C7) 2/4 Brachioradialis (C6) 2/4  Brachioradialis (C6) 2/4  Lower Extremity Lower Extremity  quadriceps (L-2 to L-4) 2/4   quadriceps (  L-2 to L-4) 2/4 Achilles (S1) 2/4   Achilles (S1) 2/4  Plantars: Right: downgoing   Left: downgoing    Lab Results: No results found for this basename: cbc, bmp, coags, chol, tri, ldl, hga1c   Lipid Panel No results found for this basename: CHOL, TRIG, HDL, CHOLHDL, VLDL, LDLCALC,  in the last 72 hours  Studies/Results: Dg Chest 2 View  01/24/2013   CLINICAL DATA:  Headache.  EXAM: CHEST  2 VIEW  COMPARISON:  CHEST x-ray 01/19/2013.  FINDINGS: Lung volumes are normal. No consolidative airspace disease. No pleural effusions. No pneumothorax. No pulmonary nodule or mass noted. Pulmonary vasculature and the cardiomediastinal silhouette are within normal limits. Atherosclerosis in the thoracic aorta.  IMPRESSION: 1.  No radiographic evidence of acute cardiopulmonary disease. 2. Atherosclerosis.   Electronically Signed   By: Trudie Reed M.D.   On: 01/24/2013 01:03    MEDICATIONS                                                                                                                        Scheduled: . acyclovir  10 mg/kg Intravenous Q8H  . heparin  5,000 Units Subcutaneous Q8H  . levETIRAcetam  750 mg Oral BID  . lidocaine-EPINEPHrine  30 mL Other Once  . potassium chloride  10 mEq Intravenous Once    ASSESSMENT/PLAN:                                                                                                            62 YO female with single new onset seizure 6 days ago now presenting with likely viral meningitis by LP results.  Exam remains nonfocal and shows no signs of meningismus. Recent HSV PCR negative.  At this time:  Recommend:  1) Contue Keppra 750 mg BID while in hospital and at time of discharge 2) HSV PCR (-) and may D/C acyclovir, continue to treat HA and fever  symptomatically 3) Patient will need to follow up with out patient neurologist 4)No driving, operating heavy machinery, perform activities at heights, swimming or participation in water activities until release by outpatient physician.  This has been discussed with patient.    Assessment and plan discussed with with attending physician and they are in agreement.    Felicie Morn PA-C [facility] [phone]  01/25/2013, 9:24 AM

## 2013-01-25 NOTE — Progress Notes (Signed)
[facility] PROGRESS NOTE  Anamae Rochelle WJX:[id number] DOB: 12-03-1950 DOA: 01/23/2013 PCP: No PCP Per Patient  Assessment/Plan: 1. Viral encephalitis - HSV PCR negative as such acyclovir d/c'd - Plans are to treat symptomatically - Still spiking fevers of 103 today as such agree with monitoring until fevers subside or ameliorate.   2. Seizure - Per neurology: continuing kepra 750 mg po bid while in hospital  3. Hypokalemia - will check magnesium level - replaced yesterday  Code Status: full Family Communication: Discussed with patient and family member at bedside Disposition Plan: With cessation or amelioration of fevers   Consultants:  Neurology  Procedures:  LP on admission  Antibiotics:  none  HPI/Subjective: No new complaints, no acute issues reported overnight.   Objective: Filed Vitals:   01/25/13 1226  BP:   Pulse:   Temp: 101.9 F (38.8 C)  Resp:     Intake/Output Summary (Last 24 hours) at 01/25/13 1324 Last data filed at 01/25/13 0900  Gross per 24 hour  Intake    580 ml  Output      0 ml  Net    580 ml   Filed Weights   01/23/13 2057 01/24/13 0540 01/24/13 2035  Weight: 44.951 kg (99 lb 1.6 oz) 45.8 kg (100 lb 15.5 oz) 46.8 kg (103 lb 2.8 oz)    Exam:   General:  Pt in NAD, Alert and awake  Cardiovascular: RRR, no MRG  Respiratory: CTA BL, no wheezes  Abdomen: soft, NT, ND  Musculoskeletal: no cyanosis or clubbing  Data Reviewed: Basic Metabolic Panel:  Recent Labs Lab 01/23/13 2322 01/25/13 0624  NA 136 135  K 2.9* 3.2*  CL 97 100  CO2 25 24  GLUCOSE 126* 113*  BUN 10 6  CREATININE 0.50 0.55  CALCIUM 9.9 9.0   Liver Function Tests: No results found for this basename: AST, ALT, ALKPHOS, BILITOT, PROT, ALBUMIN,  in the last 168 hours No results found for this basename: LIPASE, AMYLASE,  in the last 168 hours No results found for this basename: AMMONIA,  in the last 168 hours CBC:  Recent Labs Lab  01/23/13 2322 01/25/13 0624  WBC 6.4 4.8  NEUTROABS 4.3  --   HGB 12.6 10.9*  HCT 36.9 32.0*  MCV 89.3 89.9  PLT 288 259   Cardiac Enzymes: No results found for this basename: CKTOTAL, CKMB, CKMBINDEX, TROPONINI,  in the last 168 hours BNP (last 3 results) No results found for this basename: PROBNP,  in the last 8760 hours CBG: No results found for this basename: GLUCAP,  in the last 168 hours  Recent Results (from the past 240 hour(s))  CULTURE, BLOOD (ROUTINE X 2)     Status: None   Collection Time    01/23/13 11:15 PM      Result Value Range Status   Specimen Description BLOOD LEFT ANTECUBITAL   Final   Special Requests     Final   Value: BOTTLES DRAWN AEROBIC AND ANAEROBIC AER 2.5cc ANA 2.5cc   Culture  Setup Time     Final   Value: 01/24/2013 02:12     Performed at [facility]   Culture     Final   Value:        BLOOD CULTURE RECEIVED NO GROWTH TO DATE CULTURE WILL BE HELD FOR 5 DAYS BEFORE ISSUING A FINAL NEGATIVE REPORT     Performed at [facility]   Report Status PENDING   Incomplete  CULTURE, BLOOD (  ROUTINE X 2)     Status: None   Collection Time    01/23/13 11:25 PM      Result Value Range Status   Specimen Description BLOOD LEFT WRIST   Final   Special Requests BOTTLES DRAWN AEROBIC ONLY AER 4cc   Final   Culture  Setup Time     Final   Value: 01/24/2013 02:11     Performed at [facility]   Culture     Final   Value:        BLOOD CULTURE RECEIVED NO GROWTH TO DATE CULTURE WILL BE HELD FOR 5 DAYS BEFORE ISSUING A FINAL NEGATIVE REPORT     Performed at [facility]   Report Status PENDING   Incomplete  CSF CULTURE     Status: None   Collection Time    01/23/13 11:30 PM      Result Value Range Status   Specimen Description CSF   Final   Special Requests Normal   Final   Gram Stain     Final   Value: WBC PRESENT, PREDOMINANTLY MONONUCLEAR     NO ORGANISMS SEEN     CYTOSPIN     Performed at [facility]   Culture      Final   Value: NO GROWTH     Performed at [facility]   Report Status PENDING   Incomplete     Studies: Dg Chest 2 View  01/24/2013   CLINICAL DATA:  Headache.  EXAM: CHEST  2 VIEW  COMPARISON:  CHEST x-ray 01/19/2013.  FINDINGS: Lung volumes are normal. No consolidative airspace disease. No pleural effusions. No pneumothorax. No pulmonary nodule or mass noted. Pulmonary vasculature and the cardiomediastinal silhouette are within normal limits. Atherosclerosis in the thoracic aorta.  IMPRESSION: 1.  No radiographic evidence of acute cardiopulmonary disease. 2. Atherosclerosis.   Electronically Signed   By: Trudie Reed M.D.   On: 01/24/2013 01:03    Scheduled Meds: . heparin  5,000 Units Subcutaneous Q8H  . influenza vac split quadrivalent PF  0.5 mL Intramuscular Once  . levETIRAcetam  750 mg Oral BID  . lidocaine-EPINEPHrine  30 mL Other Once  . pneumococcal 23 valent vaccine  0.5 mL Intramuscular Tomorrow-1000  . potassium chloride  10 mEq Intravenous Once   Continuous Infusions:   Principal Problem:   Viral encephalitis Active Problems:   Seizures   Hypokalemia    Time spent: > 35 minutes    [name]  [facility] Pager [phone]. If 7PM-7AM, please contact night-coverage at [email or web address], password [facility] 01/25/2013, 1:24 PM  LOS: 2 days

## 2013-01-26 LAB — BASIC METABOLIC PANEL
GFR calc non Af Amer: >90 mL/min (ref >90)
Glucose, Bld: 110 mg/dL — ABNORMAL HIGH (ref 70–99)
Potassium: 3.8 mEq/L (ref 3.5–5.1)
Sodium: 139 mEq/L (ref 135–145)

## 2013-01-26 NOTE — Progress Notes (Signed)
[facility] PROGRESS NOTE  Taylor Wood VHQ:[id number] DOB: 09/01/1950 DOA: 01/23/2013 PCP: No PCP Per Patient  Assessment/Plan: 1. Viral encephalitis - HSV PCR negative as such acyclovir d/c'd - Plans are to treat symptomatically - Still spiking high fevers today as such agree with monitoring until fevers subside or ameliorate.   2. Seizure - Per neurology: continuing kepra 750 mg po bid while in hospital  3. Hypokalemia - will check magnesium level - replaced yesterday  Code Status: full Family Communication: Discussed with patient and family member at bedside Disposition Plan: With cessation or amelioration of fevers   Consultants:  Neurology  Procedures:  LP on admission  Antibiotics:  none  HPI/Subjective: No new complaints, no acute issues reported overnight.   Objective: Filed Vitals:   01/26/13 1428  BP: 127/46  Pulse: 83  Temp: 102.1 F (38.9 C)  Resp: 20    Intake/Output Summary (Last 24 hours) at 01/26/13 1630 Last data filed at 01/26/13 1300  Gross per 24 hour  Intake    480 ml  Output      0 ml  Net    480 ml   Filed Weights   01/24/13 0540 01/24/13 2035 01/25/13 2023  Weight: 45.8 kg (100 lb 15.5 oz) 46.8 kg (103 lb 2.8 oz) 46.6 kg (102 lb 11.8 oz)    Exam:   General:  Pt in NAD, Alert and awake  Cardiovascular: RRR, no MRG  Respiratory: CTA BL, no wheezes  Abdomen: soft, NT, ND  Musculoskeletal: no cyanosis or clubbing  Data Reviewed: Basic Metabolic Panel:  Recent Labs Lab 01/23/13 2322 01/25/13 0624 01/25/13 1449 01/26/13 0400  NA 136 135  --  139  K 2.9* 3.2*  --  3.8  CL 97 100  --  104  CO2 25 24  --  27  GLUCOSE 126* 113*  --  110*  BUN 10 6  --  4*  CREATININE 0.50 0.55  --  0.46*  CALCIUM 9.9 9.0  --  8.7  MG  --   --  2.1  --    Liver Function Tests: No results found for this basename: AST, ALT, ALKPHOS, BILITOT, PROT, ALBUMIN,  in the last 168 hours No results found for this basename: LIPASE,  AMYLASE,  in the last 168 hours No results found for this basename: AMMONIA,  in the last 168 hours CBC:  Recent Labs Lab 01/23/13 2322 01/25/13 0624  WBC 6.4 4.8  NEUTROABS 4.3  --   HGB 12.6 10.9*  HCT 36.9 32.0*  MCV 89.3 89.9  PLT 288 259   Cardiac Enzymes: No results found for this basename: CKTOTAL, CKMB, CKMBINDEX, TROPONINI,  in the last 168 hours BNP (last 3 results) No results found for this basename: PROBNP,  in the last 8760 hours CBG: No results found for this basename: GLUCAP,  in the last 168 hours  Recent Results (from the past 240 hour(s))  CULTURE, BLOOD (ROUTINE X 2)     Status: None   Collection Time    01/23/13 11:15 PM      Result Value Range Status   Specimen Description BLOOD LEFT ANTECUBITAL   Final   Special Requests     Final   Value: BOTTLES DRAWN AEROBIC AND ANAEROBIC AER 2.5cc ANA 2.5cc   Culture  Setup Time     Final   Value: 01/24/2013 02:12     Performed at [facility]   Culture     Final   Value:  BLOOD CULTURE RECEIVED NO GROWTH TO DATE CULTURE WILL BE HELD FOR 5 DAYS BEFORE ISSUING A FINAL NEGATIVE REPORT     Performed at [facility]   Report Status PENDING   Incomplete  CULTURE, BLOOD (ROUTINE X 2)     Status: None   Collection Time    01/23/13 11:25 PM      Result Value Range Status   Specimen Description BLOOD LEFT WRIST   Final   Special Requests BOTTLES DRAWN AEROBIC ONLY AER 4cc   Final   Culture  Setup Time     Final   Value: 01/24/2013 02:11     Performed at [facility]   Culture     Final   Value:        BLOOD CULTURE RECEIVED NO GROWTH TO DATE CULTURE WILL BE HELD FOR 5 DAYS BEFORE ISSUING A FINAL NEGATIVE REPORT     Performed at [facility]   Report Status PENDING   Incomplete  CSF CULTURE     Status: None   Collection Time    01/23/13 11:30 PM      Result Value Range Status   Specimen Description CSF   Final   Special Requests Normal   Final   Gram Stain     Final    Value: WBC PRESENT, PREDOMINANTLY MONONUCLEAR     NO ORGANISMS SEEN     CYTOSPIN     Performed at [facility]   Culture     Final   Value: NO GROWTH 2 DAYS     Performed at [facility]   Report Status PENDING   Incomplete     Studies: No results found.  Scheduled Meds: . heparin  5,000 Units Subcutaneous Q8H  . levETIRAcetam  750 mg Oral BID  . lidocaine-EPINEPHrine  30 mL Other Once   Continuous Infusions:   Principal Problem:   Viral encephalitis Active Problems:   Seizures   Hypokalemia    Time spent: > 35 minutes    Taylor Wood  [facility] Pager [phone]. If 7PM-7AM, please contact night-coverage at [email or web address], password [facility] 01/26/2013, 4:30 PM  LOS: 3 days

## 2013-01-26 NOTE — Progress Notes (Signed)
Subjective: Patient continues to have intermittent bouts of increased temperature. Etiology is unclear. She has not had a recurrence of seizure activity. She describes the headache is very mild. She's having no discomfort in her neck with full range of motion.  Objective: Current vital signs: BP 128/47  Pulse 81  Temp(Src) 102.9 F (39.4 C) (Oral)  Resp 18  Ht 5' (1.524 m)  Wt 46.6 kg (102 lb 11.8 oz)  BMI 20.06 kg/m2  SpO2 100%  Neurologic Exam: Alert and in no acute distress. Mental status was normal. Speech was normal. No facial weakness noted. Range of motion of the neck was full including neck flexion, with no discomfort.  Medications: I have reviewed the patient's current medications.  Assessment/Plan: Aseptic meningoencephalitis, clinically resolving. Etiology for continued bouts of fever remains unclear. Patient has had no recurrence of seizure activity. At this point she has no clinical signs of active meningitis.  No further neurodiagnostic studies are indicated at this point. Recommend considering ID consult.  I will plan to see her in followup on a when prn basis, for now.  C.R. Roseanne Reno, MD [facility] [phone]  01/26/2013  10:12 AM

## 2013-01-27 DIAGNOSIS — R509 Fever, unspecified: Secondary | ICD-10-CM | POA: Diagnosis present

## 2013-01-27 LAB — CRYPTOCOCCAL ANTIGEN

## 2013-01-27 LAB — CSF CULTURE W GRAM STAIN: Special Requests: NORMAL

## 2013-01-27 LAB — HIV ANTIBODY (ROUTINE TESTING W REFLEX): HIV: NONREACTIVE

## 2013-01-27 LAB — RPR: RPR Ser Ql: NONREACTIVE

## 2013-01-27 NOTE — Consult Note (Signed)
[facility]    Date of Admission:  01/23/2013  Date of Consult:  01/27/2013  Reason for Consult: Aseptic meningitis Referring Physician: Dr. Cena Benton   HPI: Taylor Wood is an 62 y.o. female with PMH significant for chronic headaches that she treats with "Goodies".  The patient has had several family members who have been ill recently and she began to have "flu like sx" and was seeking out OTC meds. She apparently had a seizure at home and was brought to [facility] where she had a workup that reportedly included an MRI of the brain and EEG. She herself does not remember much of those events presumably due to post ictal state at that time.  She was dc  On keppra. She came to [facility] with fever to 103 and HA, Nausea and vomiting. She underwent LP that showed: 163 WBC, 74% Lymphs, glucose 68, protein 78  She has been rx with Acyclovir but HSV 1, 2 PCR negative.   WE were consulted for workup of her "aseptic meningitis"  Patients HIV status is NOT known. She is currently still febrile with Temp above 101. She denies fevers, chills, night sweats or weight loss though she is already underweight. She has no known mosquito bites, tick exposures. 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 She denies ANY recreational drug use and has never travelled outside of [place].      Past Medical History  Diagnosis Date  . Seizure   . HA (headache)     History reviewed. No pertinent past surgical history. NO SURGERIES  No Known Allergies   Medications: I have reviewed patients current medications as documented in Epic Anti-infectives   Start     Dose/Rate Route Frequency Ordered Stop   01/24/13 0800  acyclovir (ZOVIRAX) 460 mg in dextrose 5 % 100 mL IVPB  Status:  Discontinued     10 mg/kg  45.8 kg 109.2 mL/hr over 60 Minutes Intravenous Every 8 hours 01/24/13 0608 01/25/13 1018   01/24/13 0330  acyclovir (ZOVIRAX) 225 mg in dextrose 5 % 100 mL IVPB     5 mg/kg   45 kg 104.5 mL/hr over 60 Minutes Intravenous  Once 01/24/13 0327 01/24/13 0452   01/23/13 2315  cefTRIAXone (ROCEPHIN) 2 g in dextrose 5 % 50 mL IVPB     2 g 100 mL/hr over 30 Minutes Intravenous  Once 01/23/13 2303 01/24/13 0016      Social History:  reports that she has never smoked. She does not have any smokeless tobacco history on file. She reports that she does not drink alcohol or use illicit drugs.  Family History  Problem Relation Age of Onset  . Hypertension Mother   . Hypertension Father    Daughter with seizure d/o  As in HPI and primary teams notes otherwise 12 point review of systems is negative  Blood pressure 115/40, pulse 80, temperature 101.6 F (38.7 C), temperature source Oral, resp. rate 20, height 5' (1.524 m), weight 102 lb 11.8 oz (46.601 kg), SpO2 100.00%. General: Alert and awake, oriented x3, not in any acute distress.underweight HEENT: anicteric sclera, pupils reactive to light and accommodation, EOMI, oropharynx clear and without exudate CVS regular rate, normal r,  no murmur rubs or gallops Chest: clear to auscultation bilaterally, no wheezing, rales or rhonchi Abdomen: soft nontender, nondistended, normal bowel sounds, Extremities: no  clubbing or edema noted bilaterally Skin: no rashes Neuro: nonfocal, strength and sensation intact  Results for orders placed during the hospital encounter of 01/23/13 (from the past 48 hour(s))  BASIC METABOLIC PANEL     Status: Abnormal   Collection Time    01/26/13  4:00 AM      Result Value Range   Sodium 139  135 - 145 mEq/L   Potassium 3.8  3.5 - 5.1 mEq/L   Chloride 104  96 - 112 mEq/L   CO2 27  19 - 32 mEq/L   Glucose, Bld 110 (*) 70 - 99 mg/dL   BUN 4 (*) 6 - 23 mg/dL   Creatinine, Ser 1.61 (*) 0.50 - 1.10 mg/dL   Calcium 8.7  8.4 - 09.6 mg/dL   GFR calc non Af Amer >90  >90 mL/min   GFR calc Af Amer >90  >90 mL/min   Comment: (NOTE)     The eGFR has been calculated using the CKD EPI equation.      This calculation has not been validated in all clinical situations.     eGFR's persistently <90 mL/min signify possible Chronic Kidney     Disease.      Component Value Date/Time   SDES CSF 01/23/2013 2330   SPECREQUEST Normal 01/23/2013 2330   CULT  Value: NO GROWTH 3 DAYS Performed at [facility] 01/23/2013 2330   REPTSTATUS 01/27/2013 FINAL 01/23/2013 2330   No results found.   Recent Results (from the past 720 hour(s))  CULTURE, BLOOD (ROUTINE X 2)     Status: None   Collection Time    01/23/13 11:15 PM      Result Value Range Status   Specimen Description BLOOD LEFT ANTECUBITAL   Final   Special Requests     Final   Value: BOTTLES DRAWN AEROBIC AND ANAEROBIC AER 2.5cc ANA 2.5cc   Culture  Setup Time     Final   Value: 01/24/2013 02:12     Performed at [facility]   Culture     Final   Value:        BLOOD CULTURE RECEIVED NO GROWTH TO DATE CULTURE WILL BE HELD FOR 5 DAYS BEFORE ISSUING A FINAL NEGATIVE REPORT     Performed at [facility]   Report Status PENDING   Incomplete  CULTURE, BLOOD (ROUTINE X 2)     Status: None   Collection Time    01/23/13 11:25 PM      Result Value Range Status   Specimen Description BLOOD LEFT WRIST   Final   Special Requests BOTTLES DRAWN AEROBIC ONLY AER 4cc   Final   Culture  Setup Time     Final   Value: 01/24/2013 02:11     Performed at [facility]   Culture     Final   Value:        BLOOD CULTURE RECEIVED NO GROWTH TO DATE CULTURE WILL BE HELD FOR 5 DAYS BEFORE ISSUING A FINAL NEGATIVE REPORT     Performed at [facility]   Report Status PENDING   Incomplete  CSF CULTURE     Status: None   Collection Time    01/23/13 11:30 PM      Result Value Range Status   Specimen Description CSF   Final   Special Requests Normal   Final   Gram Stain     Final   Value: WBC PRESENT, PREDOMINANTLY MONONUCLEAR     NO ORGANISMS SEEN     CYTOSPIN     Performed at  [facility]     Final     Value: NO GROWTH 3 DAYS     Performed at [facility]   Report Status 01/27/2013 FINAL   Final     Impression/Recommendation  62 year old AA lady with new onset seizure, now fevers and aseptic meningitis  #1 Aseptic meningitis: We clearly need to know her HIV status as this would dramatically change the differential  --I have asked to add on a cryptococcal ag to CSF --HIV antibody is ordered as well as RPR and I have ordered HIV RNA --If she is HIV negative, RPR negative, Crypto ag one could consider Enterovirus PCR on CSF, WNV and Arbovirus panel --BUT these latter entities are NOT treatable with anti-virals   Dr. Ninetta Lights is back tomorrow.     Thank you so much for this interesting consult  [facility] [phone] (pager) [phone] (office) 01/27/2013, 7:50 PM  Taylor Wood 01/27/2013, 7:50 PM

## 2013-01-27 NOTE — Progress Notes (Signed)
[facility] PROGRESS NOTE  Taylor Wood WJX:[id number] DOB: [birth year] DOA: 01/23/2013 PCP: No PCP Per Patient  Assessment/Plan: 1. Viral encephalitis - HSV PCR negative as such acyclovir d/c'd - Patient continues to spike high fevers. She is asymptomatic otherwise and non toxic appearing. Agree with ID consult given persistent fevers - ID to evaluate later today.  Placed order for RPR, HIV ab, and called [facility] to add on a cryptococcal Ag to CSF obtained on the 01/23/13  2. Seizure - Per neurology: continuing kepra 750 mg po bid while in hospital - Neurology on board. No breakthrough seizures reported.  3. Hypokalemia - magnesium level WNL - resolved after replacement.  Code Status: full Family Communication: Discussed with patient and family member at bedside Disposition Plan: With cessation or amelioration of fevers   Consultants:  Neurology  Procedures:  LP on admission  Antibiotics:  none  HPI/Subjective: No new complaints, no acute issues reported overnight.   Objective: Filed Vitals:   01/27/13 0507  BP: 127/55  Pulse: 77  Temp: 100.2 F (37.9 C)  Resp: 20    Intake/Output Summary (Last 24 hours) at 01/27/13 1059 Last data filed at 01/27/13 0600  Gross per 24 hour  Intake    720 ml  Output      0 ml  Net    720 ml   Filed Weights   01/24/13 2035 01/25/13 2023 01/26/13 2029  Weight: 46.8 kg (103 lb 2.8 oz) 46.6 kg (102 lb 11.8 oz) 46.601 kg (102 lb 11.8 oz)    Exam:   General:  Pt in NAD, Alert and awake  Cardiovascular: RRR, no MRG  Respiratory: CTA BL, no wheezes  Abdomen: soft, NT, ND  Musculoskeletal: no cyanosis or clubbing  Data Reviewed: Basic Metabolic Panel:  Recent Labs Lab 01/23/13 2322 01/25/13 0624 01/25/13 1449 01/26/13 0400  NA 136 135  --  139  K 2.9* 3.2*  --  3.8  CL 97 100  --  104  CO2 25 24  --  27  GLUCOSE 126* 113*  --  110*  BUN 10 6  --  4*  CREATININE 0.50 0.55  --  0.46*  CALCIUM  9.9 9.0  --  8.7  MG  --   --  2.1  --    Liver Function Tests: No results found for this basename: AST, ALT, ALKPHOS, BILITOT, PROT, ALBUMIN,  in the last 168 hours No results found for this basename: LIPASE, AMYLASE,  in the last 168 hours No results found for this basename: AMMONIA,  in the last 168 hours CBC:  Recent Labs Lab 01/23/13 2322 01/25/13 0624  WBC 6.4 4.8  NEUTROABS 4.3  --   HGB 12.6 10.9*  HCT 36.9 32.0*  MCV 89.3 89.9  PLT 288 259   Cardiac Enzymes: No results found for this basename: CKTOTAL, CKMB, CKMBINDEX, TROPONINI,  in the last 168 hours BNP (last 3 results) No results found for this basename: PROBNP,  in the last 8760 hours CBG: No results found for this basename: GLUCAP,  in the last 168 hours  Recent Results (from the past 240 hour(s))  CULTURE, BLOOD (ROUTINE X 2)     Status: None   Collection Time    01/23/13 11:15 PM      Result Value Range Status   Specimen Description BLOOD LEFT ANTECUBITAL   Final   Special Requests     Final   Value: BOTTLES DRAWN AEROBIC AND ANAEROBIC AER 2.5cc  ANA 2.5cc   Culture  Setup Time     Final   Value: 01/24/2013 02:12     Performed at [facility]   Culture     Final   Value:        BLOOD CULTURE RECEIVED NO GROWTH TO DATE CULTURE WILL BE HELD FOR 5 DAYS BEFORE ISSUING A FINAL NEGATIVE REPORT     Performed at [facility]   Report Status PENDING   Incomplete  CULTURE, BLOOD (ROUTINE X 2)     Status: None   Collection Time    01/23/13 11:25 PM      Result Value Range Status   Specimen Description BLOOD LEFT WRIST   Final   Special Requests BOTTLES DRAWN AEROBIC ONLY AER 4cc   Final   Culture  Setup Time     Final   Value: 01/24/2013 02:11     Performed at [facility]   Culture     Final   Value:        BLOOD CULTURE RECEIVED NO GROWTH TO DATE CULTURE WILL BE HELD FOR 5 DAYS BEFORE ISSUING A FINAL NEGATIVE REPORT     Performed at [facility]   Report Status PENDING    Incomplete  CSF CULTURE     Status: None   Collection Time    01/23/13 11:30 PM      Result Value Range Status   Specimen Description CSF   Final   Special Requests Normal   Final   Gram Stain     Final   Value: WBC PRESENT, PREDOMINANTLY MONONUCLEAR     NO ORGANISMS SEEN     CYTOSPIN     Performed at [facility]   Culture     Final   Value: NO GROWTH 3 DAYS     Performed at [facility]   Report Status 01/27/2013 FINAL   Final     Studies: No results found.  Scheduled Meds: . heparin  5,000 Units Subcutaneous Q8H  . levETIRAcetam  750 mg Oral BID  . lidocaine-EPINEPHrine  30 mL Other Once   Continuous Infusions:   Principal Problem:   Viral encephalitis Active Problems:   Seizures   Hypokalemia    Time spent: > 35 minutes    [name]  [facility] Pager [phone]. If 7PM-7AM, please contact night-coverage at [email or web address], password [facility] 01/27/2013, 10:59 AM  LOS: 4 days

## 2013-01-28 LAB — HIV-1 RNA QUANT-NO REFLEX-BLD: HIV 1 RNA Quant: <20 copies/mL (ref <20)

## 2013-01-28 NOTE — Progress Notes (Signed)
[facility] PROGRESS NOTE  Taylor Wood ZOX:[id number] DOB: Mar 05, 1951 DOA: 01/23/2013 PCP: No PCP Per Patient  Assessment/Plan: 1. Viral encephalitis - HSV PCR negative as such acyclovir d/c'd - Patient continues to spike high fevers. She is asymptomatic otherwise and non toxic appearing. - ID has recommended the following: Send state arbovirus panel.  Send CSF for enterovirus PCR  2. Seizure - Per neurology: continuing kepra 750 mg po bid while in hospital - Neurology on board. No breakthrough seizures reported.  3. Hypokalemia - magnesium level WNL - resolved after replacement.  Code Status: full Family Communication: Discussed with patient and family member at bedside Disposition Plan: With cessation or amelioration of fevers   Consultants:  Neurology  ID  Procedures:  LP on admission  Antibiotics:  none  HPI/Subjective: No new complaints, no acute issues reported overnight.    Objective: Filed Vitals:   01/28/13 1746  BP:   Pulse:   Temp: 99.2 F (37.3 C)  Resp:     Intake/Output Summary (Last 24 hours) at 01/28/13 1749 Last data filed at 01/28/13 1600  Gross per 24 hour  Intake   1680 ml  Output      0 ml  Net   1680 ml   Filed Weights   01/24/13 2035 01/25/13 2023 01/26/13 2029  Weight: 46.8 kg (103 lb 2.8 oz) 46.6 kg (102 lb 11.8 oz) 46.601 kg (102 lb 11.8 oz)    Exam:   General:  Pt in NAD, Alert and awake  Cardiovascular: RRR, no MRG  Respiratory: CTA BL, no wheezes  Abdomen: soft, NT, ND  Musculoskeletal: no cyanosis or clubbing  Data Reviewed: Basic Metabolic Panel:  Recent Labs Lab 01/23/13 2322 01/25/13 0624 01/25/13 1449 01/26/13 0400  NA 136 135  --  139  K 2.9* 3.2*  --  3.8  CL 97 100  --  104  CO2 25 24  --  27  GLUCOSE 126* 113*  --  110*  BUN 10 6  --  4*  CREATININE 0.50 0.55  --  0.46*  CALCIUM 9.9 9.0  --  8.7  MG  --   --  2.1  --    Liver Function Tests: No results found for this basename:  AST, ALT, ALKPHOS, BILITOT, PROT, ALBUMIN,  in the last 168 hours No results found for this basename: LIPASE, AMYLASE,  in the last 168 hours No results found for this basename: AMMONIA,  in the last 168 hours CBC:  Recent Labs Lab 01/23/13 2322 01/25/13 0624  WBC 6.4 4.8  NEUTROABS 4.3  --   HGB 12.6 10.9*  HCT 36.9 32.0*  MCV 89.3 89.9  PLT 288 259   Cardiac Enzymes: No results found for this basename: CKTOTAL, CKMB, CKMBINDEX, TROPONINI,  in the last 168 hours BNP (last 3 results) No results found for this basename: PROBNP,  in the last 8760 hours CBG: No results found for this basename: GLUCAP,  in the last 168 hours  Recent Results (from the past 240 hour(s))  CULTURE, BLOOD (ROUTINE X 2)     Status: None   Collection Time    01/23/13 11:15 PM      Result Value Range Status   Specimen Description BLOOD LEFT ANTECUBITAL   Final   Special Requests     Final   Value: BOTTLES DRAWN AEROBIC AND ANAEROBIC AER 2.5cc ANA 2.5cc   Culture  Setup Time     Final   Value: 01/24/2013 02:12  Performed at [facility]     Final   Value:        BLOOD CULTURE RECEIVED NO GROWTH TO DATE CULTURE WILL BE HELD FOR 5 DAYS BEFORE ISSUING A FINAL NEGATIVE REPORT     Performed at [facility]   Report Status PENDING   Incomplete  CULTURE, BLOOD (ROUTINE X 2)     Status: None   Collection Time    01/23/13 11:25 PM      Result Value Range Status   Specimen Description BLOOD LEFT WRIST   Final   Special Requests BOTTLES DRAWN AEROBIC ONLY AER 4cc   Final   Culture  Setup Time     Final   Value: 01/24/2013 02:11     Performed at [facility]   Culture     Final   Value:        BLOOD CULTURE RECEIVED NO GROWTH TO DATE CULTURE WILL BE HELD FOR 5 DAYS BEFORE ISSUING A FINAL NEGATIVE REPORT     Performed at [facility]   Report Status PENDING   Incomplete  CSF CULTURE     Status: None   Collection Time    01/23/13 11:30 PM      Result Value Range  Status   Specimen Description CSF   Final   Special Requests Normal   Final   Gram Stain     Final   Value: WBC PRESENT, PREDOMINANTLY MONONUCLEAR     NO ORGANISMS SEEN     CYTOSPIN     Performed at [facility]   Culture     Final   Value: NO GROWTH 3 DAYS     Performed at [facility]   Report Status 01/27/2013 FINAL   Final     Studies: No results found.  Scheduled Meds: . heparin  5,000 Units Subcutaneous Q8H  . levETIRAcetam  750 mg Oral BID  . lidocaine-EPINEPHrine  30 mL Other Once   Continuous Infusions:   Principal Problem:   Viral encephalitis Active Problems:   Seizures   Hypokalemia   Fever, unspecified    Time spent: > 35 minutes    Taylor Wood  [facility] Pager [phone]. If 7PM-7AM, please contact night-coverage at [email or web address], password [facility] 01/28/2013, 5:49 PM  LOS: 5 days

## 2013-01-28 NOTE — Progress Notes (Signed)
INFECTIOUS DISEASE PROGRESS NOTE  ID: Taylor Wood is a 62 y.o. female with  Principal Problem:   Viral encephalitis Active Problems:   Seizures   Hypokalemia   Fever, unspecified  Subjective: Without complaints  Abtx:  Anti-infectives   Start     Dose/Rate Route Frequency Ordered Stop   01/24/13 0800  acyclovir (ZOVIRAX) 460 mg in dextrose 5 % 100 mL IVPB  Status:  Discontinued     10 mg/kg  45.8 kg 109.2 mL/hr over 60 Minutes Intravenous Every 8 hours 01/24/13 0608 01/25/13 1018   01/24/13 0330  acyclovir (ZOVIRAX) 225 mg in dextrose 5 % 100 mL IVPB     5 mg/kg  45 kg 104.5 mL/hr over 60 Minutes Intravenous  Once 01/24/13 0327 01/24/13 0452   01/23/13 2315  cefTRIAXone (ROCEPHIN) 2 g in dextrose 5 % 50 mL IVPB     2 g 100 mL/hr over 30 Minutes Intravenous  Once 01/23/13 2303 01/24/13 0016      Medications:  Scheduled: . heparin  5,000 Units Subcutaneous Q8H  . levETIRAcetam  750 mg Oral BID  . lidocaine-EPINEPHrine  30 mL Other Once    Objective: Vital signs in last 24 hours: Temp:  [98.7 F (37.1 C)-102.3 F (39.1 C)] 101.1 F (38.4 C) (09/22 1611) Pulse Rate:  [73-93] 93 (09/22 1611) Resp:  [16-20] 16 (09/22 1611) BP: (106-124)/(43-62) 124/57 mmHg (09/22 1611) SpO2:  [97 %-98 %] 98 % (09/22 1611)   General appearance: alert, cooperative and no distress Neck: FROM Resp: clear to auscultation bilaterally Cardio: regular rate and rhythm GI: normal findings: bowel sounds normal and soft, non-tender  Lab Results  Recent Labs  01/26/13 0400  NA 139  K 3.8  CL 104  CO2 27  BUN 4*  CREATININE 0.46*   Liver Panel No results found for this basename: PROT, ALBUMIN, AST, ALT, ALKPHOS, BILITOT, BILIDIR, IBILI,  in the last 72 hours Sedimentation Rate No results found for this basename: ESRSEDRATE,  in the last 72 hours C-Reactive Protein No results found for this basename: CRP,  in the last 72 hours  Microbiology: Recent Results (from the past 240  hour(s))  CULTURE, BLOOD (ROUTINE X 2)     Status: None   Collection Time    01/23/13 11:15 PM      Result Value Range Status   Specimen Description BLOOD LEFT ANTECUBITAL   Final   Special Requests     Final   Value: BOTTLES DRAWN AEROBIC AND ANAEROBIC AER 2.5cc ANA 2.5cc   Culture  Setup Time     Final   Value: 01/24/2013 02:12     Performed at [facility]   Culture     Final   Value:        BLOOD CULTURE RECEIVED NO GROWTH TO DATE CULTURE WILL BE HELD FOR 5 DAYS BEFORE ISSUING A FINAL NEGATIVE REPORT     Performed at [facility]   Report Status PENDING   Incomplete  CULTURE, BLOOD (ROUTINE X 2)     Status: None   Collection Time    01/23/13 11:25 PM      Result Value Range Status   Specimen Description BLOOD LEFT WRIST   Final   Special Requests BOTTLES DRAWN AEROBIC ONLY AER 4cc   Final   Culture  Setup Time     Final   Value: 01/24/2013 02:11     Performed at [facility]  Final   Value:        BLOOD CULTURE RECEIVED NO GROWTH TO DATE CULTURE WILL BE HELD FOR 5 DAYS BEFORE ISSUING A FINAL NEGATIVE REPORT     Performed at [facility]   Report Status PENDING   Incomplete  CSF CULTURE     Status: None   Collection Time    01/23/13 11:30 PM      Result Value Range Status   Specimen Description CSF   Final   Special Requests Normal   Final   Gram Stain     Final   Value: WBC PRESENT, PREDOMINANTLY MONONUCLEAR     NO ORGANISMS SEEN     CYTOSPIN     Performed at [facility]   Culture     Final   Value: NO GROWTH 3 DAYS     Performed at [facility]   Report Status 01/27/2013 FINAL   Final    Studies/Results: No results found.   Assessment/Plan: Aseptic Meningitis Seizure  CSF Crypto Ag (-), CSF HSV PCR (-),  HIV RNA (-)  Total days of antibiotics: off  Would: Send state arbovirus panel.  Send CSF for enterovirus PCR Watch her temp curve. She states she feels back to baseline and is ready to go  home. Unfortunately she has temp 101.1         Johny Sax [facility] (pager) [phone] [email or web address] 01/28/2013, 4:48 PM  LOS: 5 days

## 2013-01-29 DIAGNOSIS — R509 Fever, unspecified: Secondary | ICD-10-CM

## 2013-01-29 DIAGNOSIS — R569 Unspecified convulsions: Secondary | ICD-10-CM

## 2013-01-29 DIAGNOSIS — E876 Hypokalemia: Secondary | ICD-10-CM

## 2013-01-29 DIAGNOSIS — A89 Unspecified viral infection of central nervous system: Secondary | ICD-10-CM

## 2013-01-29 LAB — RHEUMATOID FACTOR: Rhuematoid fact SerPl-aCnc: <10 IU/mL (ref <=14)

## 2013-01-29 LAB — CRYPTOCOCCAL ANTIGEN, CSF: Crypto Ag: NEGATIVE

## 2013-01-29 LAB — TSH: TSH: 0.177 u[IU]/mL — ABNORMAL LOW (ref 0.350–4.500)

## 2013-01-29 NOTE — Progress Notes (Signed)
[facility] PROGRESS NOTE  Taylor Wood EAV:[id number] DOB: [birth year] DOA: 01/23/2013 PCP: No PCP Per Patient  Brief Narrative - Patient is a 62 y/o who presented with Viral encephalitis has had persistent   Assessment/Plan: 1. Viral encephalitis - HSV PCR negative as such acyclovir d/c'd - Patient continues to spike high fevers. She is asymptomatic otherwise and non toxic appearing. - ID on board and currently awaiting further recommendations.  - Will await ANA, TSH, and RF  2. Seizure - Per neurology: continuing kepra 750 mg po bid while in hospital - Neurology on board. No breakthrough seizures reported.  3. Hypokalemia - magnesium level WNL - resolved after replacement.  Code Status: full Family Communication: Discussed with patient and family member at bedside Disposition Plan: With cessation or amelioration of fevers   Consultants:  Neurology  ID  Procedures:  LP on admission  Antibiotics:  none  HPI/Subjective: No new complaints, no acute issues reported overnight.  Still spiking fevers.  Objective: Filed Vitals:   01/29/13 1848  BP:   Pulse:   Temp: 101.2 F (38.4 C)  Resp:    No intake or output data in the 24 hours ending 01/29/13 1850 Filed Weights   01/24/13 2035 01/25/13 2023 01/26/13 2029  Weight: 46.8 kg (103 lb 2.8 oz) 46.6 kg (102 lb 11.8 oz) 46.601 kg (102 lb 11.8 oz)    Exam:   General:  Pt in NAD, Alert and awake  Cardiovascular: RRR, no MRG  Respiratory: CTA BL, no wheezes  Abdomen: soft, NT, ND  Musculoskeletal: no cyanosis or clubbing  Data Reviewed: Basic Metabolic Panel:  Recent Labs Lab 01/23/13 2322 01/25/13 0624 01/25/13 1449 01/26/13 0400  NA 136 135  --  139  K 2.9* 3.2*  --  3.8  CL 97 100  --  104  CO2 25 24  --  27  GLUCOSE 126* 113*  --  110*  BUN 10 6  --  4*  CREATININE 0.50 0.55  --  0.46*  CALCIUM 9.9 9.0  --  8.7  MG  --   --  2.1  --    Liver Function Tests: No results found for  this basename: AST, ALT, ALKPHOS, BILITOT, PROT, ALBUMIN,  in the last 168 hours No results found for this basename: LIPASE, AMYLASE,  in the last 168 hours No results found for this basename: AMMONIA,  in the last 168 hours CBC:  Recent Labs Lab 01/23/13 2322 01/25/13 0624  WBC 6.4 4.8  NEUTROABS 4.3  --   HGB 12.6 10.9*  HCT 36.9 32.0*  MCV 89.3 89.9  PLT 288 259   Cardiac Enzymes: No results found for this basename: CKTOTAL, CKMB, CKMBINDEX, TROPONINI,  in the last 168 hours BNP (last 3 results) No results found for this basename: PROBNP,  in the last 8760 hours CBG: No results found for this basename: GLUCAP,  in the last 168 hours  Recent Results (from the past 240 hour(s))  CULTURE, BLOOD (ROUTINE X 2)     Status: None   Collection Time    01/23/13 11:15 PM      Result Value Range Status   Specimen Description BLOOD LEFT ANTECUBITAL   Final   Special Requests     Final   Value: BOTTLES DRAWN AEROBIC AND ANAEROBIC AER 2.5cc ANA 2.5cc   Culture  Setup Time     Final   Value: 01/24/2013 02:12     Performed at [facility]  Final   Value:        BLOOD CULTURE RECEIVED NO GROWTH TO DATE CULTURE WILL BE HELD FOR 5 DAYS BEFORE ISSUING A FINAL NEGATIVE REPORT     Performed at [facility]   Report Status PENDING   Incomplete  CULTURE, BLOOD (ROUTINE X 2)     Status: None   Collection Time    01/23/13 11:25 PM      Result Value Range Status   Specimen Description BLOOD LEFT WRIST   Final   Special Requests BOTTLES DRAWN AEROBIC ONLY AER 4cc   Final   Culture  Setup Time     Final   Value: 01/24/2013 02:11     Performed at [facility]   Culture     Final   Value:        BLOOD CULTURE RECEIVED NO GROWTH TO DATE CULTURE WILL BE HELD FOR 5 DAYS BEFORE ISSUING A FINAL NEGATIVE REPORT     Performed at [facility]   Report Status PENDING   Incomplete  CSF CULTURE     Status: None   Collection Time    01/23/13 11:30 PM       Result Value Range Status   Specimen Description CSF   Final   Special Requests Normal   Final   Gram Stain     Final   Value: WBC PRESENT, PREDOMINANTLY MONONUCLEAR     NO ORGANISMS SEEN     CYTOSPIN     Performed at [facility]   Culture     Final   Value: NO GROWTH 3 DAYS     Performed at [facility]   Report Status 01/27/2013 FINAL   Final     Studies: No results found.  Scheduled Meds: . heparin  5,000 Units Subcutaneous Q8H  . levETIRAcetam  750 mg Oral BID  . lidocaine-EPINEPHrine  30 mL Other Once   Continuous Infusions:   Principal Problem:   Viral encephalitis Active Problems:   Seizures   Hypokalemia   Fever, unspecified    Time spent: > 35 minutes    Taylor Wood  [facility] Pager [phone]. If 7PM-7AM, please contact night-coverage at [email or web address], password [email or web address] 01/29/2013, 6:50 PM  LOS: 6 days

## 2013-01-29 NOTE — Progress Notes (Signed)
INFECTIOUS DISEASE PROGRESS NOTE  ID: Taylor Wood is a 62 y.o. female with  Principal Problem:   Viral encephalitis Active Problems:   Seizures   Hypokalemia   Fever, unspecified  Subjective: Without complaints, no headaches. her wt has been steady.  Abtx:  Anti-infectives   Start     Dose/Rate Route Frequency Ordered Stop   01/24/13 0800  acyclovir (ZOVIRAX) 460 mg in dextrose 5 % 100 mL IVPB  Status:  Discontinued     10 mg/kg  45.8 kg 109.2 mL/hr over 60 Minutes Intravenous Every 8 hours 01/24/13 0608 01/25/13 1018   01/24/13 0330  acyclovir (ZOVIRAX) 225 mg in dextrose 5 % 100 mL IVPB     5 mg/kg  45 kg 104.5 mL/hr over 60 Minutes Intravenous  Once 01/24/13 0327 01/24/13 0452   01/23/13 2315  cefTRIAXone (ROCEPHIN) 2 g in dextrose 5 % 50 mL IVPB     2 g 100 mL/hr over 30 Minutes Intravenous  Once 01/23/13 2303 01/24/13 0016      Medications:  Scheduled: . heparin  5,000 Units Subcutaneous Q8H  . levETIRAcetam  750 mg Oral BID  . lidocaine-EPINEPHrine  30 mL Other Once    Objective: Vital signs in last 24 hours: Temp:  [99.2 F (37.3 C)-102.5 F (39.2 C)] 100.5 F (38.1 C) (09/23 1309) Pulse Rate:  [77-93] 77 (09/23 1309) Resp:  [16] 16 (09/23 1309) BP: (92-124)/(39-57) 107/41 mmHg (09/23 1309) SpO2:  [98 %-99 %] 98 % (09/23 1309)   General appearance: alert, cooperative and no distress Resp: clear to auscultation bilaterally Cardio: regular rate and rhythm GI: normal findings: bowel sounds normal and soft, non-tender  Lab Results No results found for this basename: WBC, HGB, HCT, PLATELETS, NA, K, CL, CO2, BUN, CREATININE, GLU,  in the last 72 hours Liver Panel No results found for this basename: PROT, ALBUMIN, AST, ALT, ALKPHOS, BILITOT, BILIDIR, IBILI,  in the last 72 hours Sedimentation Rate No results found for this basename: ESRSEDRATE,  in the last 72 hours C-Reactive Protein No results found for this basename: CRP,  in the last 72  hours  Microbiology: Recent Results (from the past 240 hour(s))  CULTURE, BLOOD (ROUTINE X 2)     Status: None   Collection Time    01/23/13 11:15 PM      Result Value Range Status   Specimen Description BLOOD LEFT ANTECUBITAL   Final   Special Requests     Final   Value: BOTTLES DRAWN AEROBIC AND ANAEROBIC AER 2.5cc ANA 2.5cc   Culture  Setup Time     Final   Value: 01/24/2013 02:12     Performed at [facility]   Culture     Final   Value:        BLOOD CULTURE RECEIVED NO GROWTH TO DATE CULTURE WILL BE HELD FOR 5 DAYS BEFORE ISSUING A FINAL NEGATIVE REPORT     Performed at [facility]   Report Status PENDING   Incomplete  CULTURE, BLOOD (ROUTINE X 2)     Status: None   Collection Time    01/23/13 11:25 PM      Result Value Range Status   Specimen Description BLOOD LEFT WRIST   Final   Special Requests BOTTLES DRAWN AEROBIC ONLY AER 4cc   Final   Culture  Setup Time     Final   Value: 01/24/2013 02:11     Performed at [facility]  Final   Value:        BLOOD CULTURE RECEIVED NO GROWTH TO DATE CULTURE WILL BE HELD FOR 5 DAYS BEFORE ISSUING A FINAL NEGATIVE REPORT     Performed at [facility]   Report Status PENDING   Incomplete  CSF CULTURE     Status: None   Collection Time    01/23/13 11:30 PM      Result Value Range Status   Specimen Description CSF   Final   Special Requests Normal   Final   Gram Stain     Final   Value: WBC PRESENT, PREDOMINANTLY MONONUCLEAR     NO ORGANISMS SEEN     CYTOSPIN     Performed at [facility]   Culture     Final   Value: NO GROWTH 3 DAYS     Performed at [facility]   Report Status 01/27/2013 FINAL   Final    Studies/Results: No results found.   Assessment/Plan: Aseptic Meningitis Fever  Previous (-) MRI of head at [facility] (by report) Would check ANA, TSH, RF.  Would consider CT chest/abd/pelvis as part of her fever w/u.   Total days of antibiotics:  0         Johny Sax Infectious Diseases (pager) [phone] [email or web address] 01/29/2013, 1:36 PM  LOS: 6 days

## 2013-01-29 NOTE — Care Management Note (Signed)
   CARE MANAGEMENT NOTE 01/29/2013  Patient:  Taylor Wood,Taylor Wood   Account Number:  [id number]  Date Initiated:  01/29/2013  Documentation initiated by:  Elizabth Palka  Subjective/Objective Assessment:   Following for progression and d/c needs.     Action/Plan:   Anticipated DC Date:  02/02/2013   Anticipated DC Plan:  HOME W HOME HEALTH SERVICES         Choice offered to / List presented to:             Status of service:  In process, will continue to follow Medicare Important Message given?   (If response is "NO", the following Medicare IM given date fields will be blank) Date Medicare IM given:   Date Additional Medicare IM given:    Discharge Disposition:    Per UR Regulation:    If discussed at Long Length of Stay Meetings, dates discussed:    Comments:

## 2013-01-30 ENCOUNTER — Encounter (HOSPITAL_COMMUNITY): Payer: Self-pay | Admitting: Radiology

## 2013-01-30 ENCOUNTER — Inpatient Hospital Stay (HOSPITAL_COMMUNITY): Payer: BC Managed Care – PPO

## 2013-01-30 LAB — ANTI-NUCLEAR AB-TITER (ANA TITER): ANA Titer 1: 1:80 {titer} — ABNORMAL HIGH

## 2013-01-30 LAB — CULTURE, BLOOD (ROUTINE X 2): Culture: NO GROWTH

## 2013-01-30 LAB — T3, FREE: T3, Free: 3.4 pg/mL (ref 2.3–4.2)

## 2013-01-30 LAB — ANA: Anti Nuclear Antibody(ANA): POSITIVE — AB

## 2013-01-30 MED ORDER — IOHEXOL 300 MG/ML  SOLN
80.0000 mL | Freq: Once | INTRAMUSCULAR | Status: AC | PRN
  Administered 2013-01-30: 80 mL via INTRAVENOUS

## 2013-01-30 MED ORDER — IOHEXOL 300 MG/ML  SOLN
25.0000 mL | INTRAMUSCULAR | Status: AC
  Administered 2013-01-30 (×2): 25 mL via ORAL

## 2013-01-30 NOTE — Progress Notes (Signed)
INFECTIOUS DISEASE PROGRESS NOTE  ID: Taylor Wood is a 62 y.o. female with  Principal Problem:   Viral encephalitis Active Problems:   Seizures   Hypokalemia   Fever, unspecified  Subjective: without complaints, no pain. continued fever. No joint aches.   Abtx:  Anti-infectives   Start     Dose/Rate Route Frequency Ordered Stop   01/24/13 0800  acyclovir (ZOVIRAX) 460 mg in dextrose 5 % 100 mL IVPB  Status:  Discontinued     10 mg/kg  45.8 kg 109.2 mL/hr over 60 Minutes Intravenous Every 8 hours 01/24/13 0608 01/25/13 1018   01/24/13 0330  acyclovir (ZOVIRAX) 225 mg in dextrose 5 % 100 mL IVPB     5 mg/kg  45 kg 104.5 mL/hr over 60 Minutes Intravenous  Once 01/24/13 0327 01/24/13 0452   01/23/13 2315  cefTRIAXone (ROCEPHIN) 2 g in dextrose 5 % 50 mL IVPB     2 g 100 mL/hr over 30 Minutes Intravenous  Once 01/23/13 2303 01/24/13 0016      Medications:  Scheduled: . heparin  5,000 Units Subcutaneous Q8H  . levETIRAcetam  750 mg Oral BID  . lidocaine-EPINEPHrine  30 mL Other Once    Objective: Vital signs in last 24 hours: Temp:  [99.3 F (37.4 C)-102.2 F (39 C)] 99.3 F (37.4 C) (09/24 1429) Pulse Rate:  [79-87] 87 (09/24 1429) Resp:  [16-20] 20 (09/24 1429) BP: (87-112)/(29-41) 99/29 mmHg (09/24 1429) SpO2:  [99 %-100 %] 99 % (09/24 1429)   General appearance: alert, cooperative and no distress Throat: abnormal findings: dentition: mandible, maxilla non-tender to palplation.  Resp: clear to auscultation bilaterally Cardio: regular rate and rhythm GI: normal findings: bowel sounds normal and soft, non-tender  Lab Results No results found for this basename: WBC, HGB, HCT, PLATELETS, NA, K, CL, CO2, BUN, CREATININE, GLU,  in the last 72 hours Liver Panel No results found for this basename: PROT, ALBUMIN, AST, ALT, ALKPHOS, BILITOT, BILIDIR, IBILI,  in the last 72 hours Sedimentation Rate No results found for this basename: ESRSEDRATE,  in the last 72  hours C-Reactive Protein No results found for this basename: CRP,  in the last 72 hours  Microbiology: Recent Results (from the past 240 hour(s))  CULTURE, BLOOD (ROUTINE X 2)     Status: None   Collection Time    01/23/13 11:15 PM      Result Value Range Status   Specimen Description BLOOD LEFT ANTECUBITAL   Final   Special Requests     Final   Value: BOTTLES DRAWN AEROBIC AND ANAEROBIC AER 2.5cc ANA 2.5cc   Culture  Setup Time     Final   Value: 01/24/2013 02:12     Performed at [facility]   Culture     Final   Value: NO GROWTH 5 DAYS     Performed at [facility]   Report Status 01/30/2013 FINAL   Final  CULTURE, BLOOD (ROUTINE X 2)     Status: None   Collection Time    01/23/13 11:25 PM      Result Value Range Status   Specimen Description BLOOD LEFT WRIST   Final   Special Requests BOTTLES DRAWN AEROBIC ONLY AER 4cc   Final   Culture  Setup Time     Final   Value: 01/24/2013 02:11     Performed at [facility]   Culture     Final   Value: NO GROWTH 5 DAYS  Performed at [facility]   Report Status 01/30/2013 FINAL   Final  CSF CULTURE     Status: None   Collection Time    01/23/13 11:30 PM      Result Value Range Status   Specimen Description CSF   Final   Special Requests Normal   Final   Gram Stain     Final   Value: WBC PRESENT, PREDOMINANTLY MONONUCLEAR     NO ORGANISMS SEEN     CYTOSPIN     Performed at [facility]   Culture     Final   Value: NO GROWTH 3 DAYS     Performed at [facility]   Report Status 01/27/2013 FINAL   Final    Studies/Results: No results found.   Assessment/Plan: Aseptic Meningitis Fever ANA 1:80  Will check CT chest/abd/pelvis Will check [facility] Could consider rheum eval. With her ANA+   Total days of antibiotics: 0         Johny Sax Infectious Diseases (pager) [phone] [email or web address] 01/30/2013, 4:30 PM  LOS: 7 days

## 2013-01-30 NOTE — Progress Notes (Addendum)
[facility] PROGRESS NOTE  Taylor Wood UJW:[id number] DOB: 1950/09/19 DOA: 01/23/2013 PCP: No PCP Per Patient  Brief Narrative - Patient is a 62 y/o who presented with Viral encephalitis has had persistent fever   Assessment/Plan:  1. Viral encephalitis - HSV PCR negative as such acyclovir d/c'd - Patient continues to spike high fevers 9/24. She is asymptomatic otherwise and non toxic appearing. Blood c/s neg;  - ID on board and currently awaiting further recommendations.  - + ANA, low TSH ? contributing for fever; will obtain autoimmune panel test, and free T4 ;  2. Seizure - Per neurology: continuing kepra 750 mg po bid while in hospital - Neurology on board. No breakthrough seizures reported.  3. Hypokalemia - magnesium level and WNL - resolved after replacement.   No driving, operating heavy machinery, perform activities at heights, swimming or participation in water activities until release by outpatient physician. This has been discussed with patient.  Code Status: full Family Communication: Discussed with patient and family member at bedside Disposition Plan: With cessation or amelioration of fevers   Consultants:  Neurology  ID  Procedures:  LP on admission  Antibiotics:  none  HPI/Subjective: No new complaints, no acute issues reported overnight.  Still spiking fevers.  Objective: Filed Vitals:   01/30/13 1136  BP:   Pulse:   Temp: 99.4 F (37.4 C)  Resp:    No intake or output data in the 24 hours ending 01/30/13 1342 Filed Weights   01/24/13 2035 01/25/13 2023 01/26/13 2029  Weight: 46.8 kg (103 lb 2.8 oz) 46.6 kg (102 lb 11.8 oz) 46.601 kg (102 lb 11.8 oz)    Exam:   General:  Pt in NAD, Alert and awake  Cardiovascular: RRR, no MRG  Respiratory: CTA BL, no wheezes  Abdomen: soft, NT, ND  Musculoskeletal: no cyanosis or clubbing  Data Reviewed: Basic Metabolic Panel:  Recent Labs Lab 01/23/13 2322 01/25/13 0624  01/25/13 1449 01/26/13 0400  NA 136 135  --  139  K 2.9* 3.2*  --  3.8  CL 97 100  --  104  CO2 25 24  --  27  GLUCOSE 126* 113*  --  110*  BUN 10 6  --  4*  CREATININE 0.50 0.55  --  0.46*  CALCIUM 9.9 9.0  --  8.7  MG  --   --  2.1  --    Liver Function Tests: No results found for this basename: AST, ALT, ALKPHOS, BILITOT, PROT, ALBUMIN,  in the last 168 hours No results found for this basename: LIPASE, AMYLASE,  in the last 168 hours No results found for this basename: AMMONIA,  in the last 168 hours CBC:  Recent Labs Lab 01/23/13 2322 01/25/13 0624  WBC 6.4 4.8  NEUTROABS 4.3  --   HGB 12.6 10.9*  HCT 36.9 32.0*  MCV 89.3 89.9  PLT 288 259   Cardiac Enzymes: No results found for this basename: CKTOTAL, CKMB, CKMBINDEX, TROPONINI,  in the last 168 hours BNP (last 3 results) No results found for this basename: PROBNP,  in the last 8760 hours CBG: No results found for this basename: GLUCAP,  in the last 168 hours  Recent Results (from the past 240 hour(s))  CULTURE, BLOOD (ROUTINE X 2)     Status: None   Collection Time    01/23/13 11:15 PM      Result Value Range Status   Specimen Description BLOOD LEFT ANTECUBITAL   Final   Special  Requests     Final   Value: BOTTLES DRAWN AEROBIC AND ANAEROBIC AER 2.5cc ANA 2.5cc   Culture  Setup Time     Final   Value: 01/24/2013 02:12     Performed at [facility]   Culture     Final   Value: NO GROWTH 5 DAYS     Performed at [facility]   Report Status 01/30/2013 FINAL   Final  CULTURE, BLOOD (ROUTINE X 2)     Status: None   Collection Time    01/23/13 11:25 PM      Result Value Range Status   Specimen Description BLOOD LEFT WRIST   Final   Special Requests BOTTLES DRAWN AEROBIC ONLY AER 4cc   Final   Culture  Setup Time     Final   Value: 01/24/2013 02:11     Performed at [facility]   Culture     Final   Value: NO GROWTH 5 DAYS     Performed at [facility]   Report Status  01/30/2013 FINAL   Final  CSF CULTURE     Status: None   Collection Time    01/23/13 11:30 PM      Result Value Range Status   Specimen Description CSF   Final   Special Requests Normal   Final   Gram Stain     Final   Value: WBC PRESENT, PREDOMINANTLY MONONUCLEAR     NO ORGANISMS SEEN     CYTOSPIN     Performed at [facility]   Culture     Final   Value: NO GROWTH 3 DAYS     Performed at [facility]   Report Status 01/27/2013 FINAL   Final     Studies: No results found.  Scheduled Meds: . heparin  5,000 Units Subcutaneous Q8H  . levETIRAcetam  750 mg Oral BID  . lidocaine-EPINEPHrine  30 mL Other Once   Continuous Infusions:   Principal Problem:   Viral encephalitis Active Problems:   Seizures   Hypokalemia   Fever, unspecified    Time spent: > 35 minutes    Taylor Wood  [facility] Pager [phone] If 7PM-7AM, please contact night-coverage at [email or web address], password [facility] 01/30/2013, 1:42 PM  LOS: 7 days

## 2013-01-31 MED ORDER — AMOXICILLIN-POT CLAVULANATE 875-125 MG PO TABS
1.0000 | ORAL_TABLET | Freq: Two times a day (BID) | ORAL | Status: DC
  Administered 2013-01-31 – 2013-02-01 (×3): 1 via ORAL
  Filled 2013-01-31 (×4): qty 1

## 2013-01-31 NOTE — Progress Notes (Signed)
NEURO HOSPITALIST PROGRESS NOTE   SUBJECTIVE:                                                                                                                        Patient has no complaints, no further seizure activity noted.  She denies HA, neck stiffness or nuchal discomfort.   OBJECTIVE:                                                                                                                           Vital signs in last 24 hours: Temp:  [98.8 F (37.1 C)-100.8 F (38.2 C)] 98.8 F (37.1 C) (09/25 1003) Pulse Rate:  [85-100] 85 (09/25 1003) Resp:  [20] 20 (09/25 1003) BP: (87-112)/(29-72) 102/50 mmHg (09/25 1003) SpO2:  [98 %-100 %] 98 % (09/25 1003)  Intake/Output from previous day: 09/24 0701 - 09/25 0700 In: 240 [P.O.:240] Out: -  Intake/Output this shift: Total I/O In: 240 [P.O.:240] Out: -  Nutritional status: General  Past Medical History  Diagnosis Date  . Seizure   . HA (headache)      Neurologic Exam:  Mental Status: Alert, oriented, thought content appropriate.  Speech fluent without evidence of aphasia.  Able to follow 3 step commands without difficulty. Cranial Nerves: II: Discs flat bilaterally; Visual fields grossly normal, pupils equal, round, reactive to light and accommodation III,IV, VI: ptosis not present, extra-ocular motions intact bilaterally V,VII: smile symmetric, facial light touch sensation normal bilaterally VIII: hearing normal bilaterally IX,X: gag reflex present XI: bilateral shoulder shrug XII: midline tongue extension Motor: Right : Upper extremity   5/5    Left:     Upper extremity   5/5  Lower extremity   5/5     Lower extremity   5/5 Tone and bulk:normal tone throughout; no atrophy noted Sensory: Pinprick and light touch intact throughout, bilaterally Deep Tendon Reflexes:  Right: Upper Extremity   Left: Upper extremity   biceps (C-5 to C-6) 2/4   biceps (C-5 to C-6) 2/4 tricep  (C7) 2/4    triceps (C7) 2/4 Brachioradialis (C6) 2/4  Brachioradialis (C6) 2/4  Lower Extremity Lower Extremity  quadriceps (L-2 to L-4) 2/4   quadriceps (L-2 to L-4) 2/4 Achilles (S1) 2/4  Achilles (S1) 2/4  Plantars: Right: downgoing   Left: downgoing Cerebellar: normal finger-to-nose,  normal heel-to-shin test CV: pulses palpable throughout    Lab Results: No results found for this basename: cbc, bmp, coags, chol, tri, ldl, hga1c   Lipid Panel No results found for this basename: CHOL, TRIG, HDL, CHOLHDL, VLDL, LDLCALC,  in the last 72 hours  Studies/Results: Dg Orthopantogram  01/30/2013   CLINICAL DATA:  Fever.  EXAM: ORTHOPANTOGRAM/PANORAMIC  COMPARISON:  None.  FINDINGS: There is poor dentition with multiple dental caries. Several teeth are fragmented. There is periodontal lucency, especially along the roots of the central mandibular incisors. The left mandibular wisdom tooth is impacted. There is no evidence of acute fracture or bone destruction.  IMPRESSION: Dental caries with tooth fragmentation and periodontal disease as described.   Electronically Signed   By: Roxy Horseman   On: 01/30/2013 23:58   Ct Chest W Contrast  01/31/2013   CLINICAL DATA:  Fever. Viral encephalitis.  EXAM: CT CHEST, ABDOMEN, AND PELVIS WITH CONTRAST  TECHNIQUE: Multidetector CT imaging of the chest, abdomen and pelvis was performed following the standard protocol during bolus administration of intravenous contrast.  CONTRAST:  80mL OMNIPAQUE IOHEXOL 300 MG/ML  SOLN  COMPARISON:  None.  FINDINGS: CT CHEST FINDINGS  There is no axillary, hilar or mediastinal lymphadenopathy. Heart size is normal. No pleural or pericardial effusion. The lungs are clear. No bony abnormality is identified.  CT ABDOMEN AND PELVIS FINDINGS  The liver, gallbladder, adrenal glands, spleen, pancreas and kidneys all appear normal. The patient is status post hysterectomy. The stomach, small and large bowel and appendix appear normal.  Small left pelvic sidewall node measures 0.8 cm short axis dimension. No pathologic lymphadenopathy by CT size criteria is identified. No focal bony abnormality is seen.  IMPRESSION: CT CHEST IMPRESSION  Negative chest CT.  CT ABDOMEN AND PELVIS IMPRESSION  No acute finding in the abdomen or pelvis. No source for fever is identified.  Status post hysterectomy.   Electronically Signed   By: Drusilla Kanner M.D.   On: 01/31/2013 00:03   Ct Abdomen Pelvis W Contrast  01/31/2013   CLINICAL DATA:  Fever. Viral encephalitis.  EXAM: CT CHEST, ABDOMEN, AND PELVIS WITH CONTRAST  TECHNIQUE: Multidetector CT imaging of the chest, abdomen and pelvis was performed following the standard protocol during bolus administration of intravenous contrast.  CONTRAST:  80mL OMNIPAQUE IOHEXOL 300 MG/ML  SOLN  COMPARISON:  None.  FINDINGS: CT CHEST FINDINGS  There is no axillary, hilar or mediastinal lymphadenopathy. Heart size is normal. No pleural or pericardial effusion. The lungs are clear. No bony abnormality is identified.  CT ABDOMEN AND PELVIS FINDINGS  The liver, gallbladder, adrenal glands, spleen, pancreas and kidneys all appear normal. The patient is status post hysterectomy. The stomach, small and large bowel and appendix appear normal. Small left pelvic sidewall node measures 0.8 cm short axis dimension. No pathologic lymphadenopathy by CT size criteria is identified. No focal bony abnormality is seen.  IMPRESSION: CT CHEST IMPRESSION  Negative chest CT.  CT ABDOMEN AND PELVIS IMPRESSION  No acute finding in the abdomen or pelvis. No source for fever is identified.  Status post hysterectomy.   Electronically Signed   By: Drusilla Kanner M.D.   On: 01/31/2013 00:03    MEDICATIONS  Scheduled: . heparin  5,000 Units Subcutaneous Q8H  . levETIRAcetam  750 mg Oral BID  . lidocaine-EPINEPHrine  30 mL  Other Once    ASSESSMENT/PLAN:                                                                                                            Aseptic meningitis, clinically resolving, however continues to have fever remains unclear. Patient has had no recurrence of seizure activity and continues to have no clinical signs of active meningitis. ID following looking for possible source.  No further neurodiagnostic studies are indicated at this point. I will plan to see her in followup on a when prn basis, for now.  Assessment and plan discussed with with attending physician and they are in agreement.    Felicie Morn PA-C [facility] [phone]  01/31/2013, 12:38 PM

## 2013-01-31 NOTE — Progress Notes (Signed)
INFECTIOUS DISEASE PROGRESS NOTE  ID: Taylor Wood is a 62 y.o. female with  Principal Problem:   Viral encephalitis Active Problems:   Seizures   Hypokalemia   Fever, unspecified  Subjective: Without complaints  Abtx:  Anti-infectives   Start     Dose/Rate Route Frequency Ordered Stop   01/24/13 0800  acyclovir (ZOVIRAX) 460 mg in dextrose 5 % 100 mL IVPB  Status:  Discontinued     10 mg/kg  45.8 kg 109.2 mL/hr over 60 Minutes Intravenous Every 8 hours 01/24/13 0608 01/25/13 1018   01/24/13 0330  acyclovir (ZOVIRAX) 225 mg in dextrose 5 % 100 mL IVPB     5 mg/kg  45 kg 104.5 mL/hr over 60 Minutes Intravenous  Once 01/24/13 0327 01/24/13 0452   01/23/13 2315  cefTRIAXone (ROCEPHIN) 2 g in dextrose 5 % 50 mL IVPB     2 g 100 mL/hr over 30 Minutes Intravenous  Once 01/23/13 2303 01/24/13 0016      Medications:  Scheduled: . heparin  5,000 Units Subcutaneous Q8H  . levETIRAcetam  750 mg Oral BID  . lidocaine-EPINEPHrine  30 mL Other Once    Objective: Vital signs in last 24 hours: Temp:  [98.8 F (37.1 C)-100.8 F (38.2 C)] 100.8 F (38.2 C) (09/25 1348) Pulse Rate:  [80-100] 80 (09/25 1348) Resp:  [18-20] 18 (09/25 1348) BP: (87-112)/(47-72) 98/58 mmHg (09/25 1348) SpO2:  [96 %-100 %] 96 % (09/25 1348)   General appearance: alert, cooperative and no distress Resp: clear to auscultation bilaterally Cardio: regular rate and rhythm GI: normal findings: bowel sounds normal and soft, non-tender  Lab Results No results found for this basename: WBC, HGB, HCT, PLATELETS, NA, K, CL, CO2, BUN, CREATININE, GLU,  in the last 72 hours Liver Panel No results found for this basename: PROT, ALBUMIN, AST, ALT, ALKPHOS, BILITOT, BILIDIR, IBILI,  in the last 72 hours Sedimentation Rate  Recent Labs  01/30/13 1506  ESRSEDRATE 47*   C-Reactive Protein  Recent Labs  01/30/13 1506  CRP <0.5*    Microbiology: Recent Results (from the past 240 hour(s))  CULTURE, BLOOD  (ROUTINE X 2)     Status: None   Collection Time    01/23/13 11:15 PM      Result Value Range Status   Specimen Description BLOOD LEFT ANTECUBITAL   Final   Special Requests     Final   Value: BOTTLES DRAWN AEROBIC AND ANAEROBIC AER 2.5cc ANA 2.5cc   Culture  Setup Time     Final   Value: 01/24/2013 02:12     Performed at [facility]   Culture     Final   Value: NO GROWTH 5 DAYS     Performed at [facility]   Report Status 01/30/2013 FINAL   Final  CULTURE, BLOOD (ROUTINE X 2)     Status: None   Collection Time    01/23/13 11:25 PM      Result Value Range Status   Specimen Description BLOOD LEFT WRIST   Final   Special Requests BOTTLES DRAWN AEROBIC ONLY AER 4cc   Final   Culture  Setup Time     Final   Value: 01/24/2013 02:11     Performed at [facility]   Culture     Final   Value: NO GROWTH 5 DAYS     Performed at [facility]   Report Status 01/30/2013 FINAL   Final  CSF CULTURE  Status: None   Collection Time    01/23/13 11:30 PM      Result Value Range Status   Specimen Description CSF   Final   Special Requests Normal   Final   Gram Stain     Final   Value: WBC PRESENT, PREDOMINANTLY MONONUCLEAR     NO ORGANISMS SEEN     CYTOSPIN     Performed at [facility]   Culture     Final   Value: NO GROWTH 3 DAYS     Performed at [facility]   Report Status 01/27/2013 FINAL   Final    Studies/Results: Dg Orthopantogram  01/30/2013   CLINICAL DATA:  Fever.  EXAM: ORTHOPANTOGRAM/PANORAMIC  COMPARISON:  None.  FINDINGS: There is poor dentition with multiple dental caries. Several teeth are fragmented. There is periodontal lucency, especially along the roots of the central mandibular incisors. The left mandibular wisdom tooth is impacted. There is no evidence of acute fracture or bone destruction.  IMPRESSION: Dental caries with tooth fragmentation and periodontal disease as described.   Electronically Signed   By: Roxy Horseman   On: 01/30/2013 23:58   Ct Chest W Contrast  01/31/2013   CLINICAL DATA:  Fever. Viral encephalitis.  EXAM: CT CHEST, ABDOMEN, AND PELVIS WITH CONTRAST  TECHNIQUE: Multidetector CT imaging of the chest, abdomen and pelvis was performed following the standard protocol during bolus administration of intravenous contrast.  CONTRAST:  80mL OMNIPAQUE IOHEXOL 300 MG/ML  SOLN  COMPARISON:  None.  FINDINGS: CT CHEST FINDINGS  There is no axillary, hilar or mediastinal lymphadenopathy. Heart size is normal. No pleural or pericardial effusion. The lungs are clear. No bony abnormality is identified.  CT ABDOMEN AND PELVIS FINDINGS  The liver, gallbladder, adrenal glands, spleen, pancreas and kidneys all appear normal. The patient is status post hysterectomy. The stomach, small and large bowel and appendix appear normal. Small left pelvic sidewall node measures 0.8 cm short axis dimension. No pathologic lymphadenopathy by CT size criteria is identified. No focal bony abnormality is seen.  IMPRESSION: CT CHEST IMPRESSION  Negative chest CT.  CT ABDOMEN AND PELVIS IMPRESSION  No acute finding in the abdomen or pelvis. No source for fever is identified.  Status post hysterectomy.   Electronically Signed   By: Drusilla Kanner M.D.   On: 01/31/2013 00:03   Ct Abdomen Pelvis W Contrast  01/31/2013   CLINICAL DATA:  Fever. Viral encephalitis.  EXAM: CT CHEST, ABDOMEN, AND PELVIS WITH CONTRAST  TECHNIQUE: Multidetector CT imaging of the chest, abdomen and pelvis was performed following the standard protocol during bolus administration of intravenous contrast.  CONTRAST:  80mL OMNIPAQUE IOHEXOL 300 MG/ML  SOLN  COMPARISON:  None.  FINDINGS: CT CHEST FINDINGS  There is no axillary, hilar or mediastinal lymphadenopathy. Heart size is normal. No pleural or pericardial effusion. The lungs are clear. No bony abnormality is identified.  CT ABDOMEN AND PELVIS FINDINGS  The liver, gallbladder, adrenal glands, spleen, pancreas and  kidneys all appear normal. The patient is status post hysterectomy. The stomach, small and large bowel and appendix appear normal. Small left pelvic sidewall node measures 0.8 cm short axis dimension. No pathologic lymphadenopathy by CT size criteria is identified. No focal bony abnormality is seen.  IMPRESSION: CT CHEST IMPRESSION  Negative chest CT.  CT ABDOMEN AND PELVIS IMPRESSION  No acute finding in the abdomen or pelvis. No source for fever is identified.  Status post hysterectomy.   Electronically Signed  By: Drusilla Kanner M.D.   On: 01/31/2013 00:03     Assessment/Plan: Aseptic Meningitis Fever ANA 1:80 (DS DNA -)  Total days of antibiotics: 0  CT chest/abd/pelvis (-) panorex does not describe abscess  Will give her augmentin for her dental disease would consider d/c home if she continues to look well clinically and she can f/u in ID clinic in 2 weeks.          Johny Sax Infectious Diseases (pager) [phone] [email or web address] 01/31/2013, 2:52 PM  LOS: 8 days

## 2013-01-31 NOTE — Progress Notes (Signed)
[facility] PROGRESS NOTE  Koriana Stepien ZOX:[id number] DOB: 04-08-1951 DOA: 01/23/2013 PCP: No PCP Per Patient  Brief Narrative - Patient is a 62 y/o who presented with Viral encephalitis has had persistent fever   Assessment/Plan:  1. Viral encephalitis; fever  - HSV PCR negative as such acyclovir d/c'd - Patient continues to spike high fevers 9/24-9/25. She is asymptomatic otherwise and non toxic appearing. Blood c/s neg;  - ID on board and currently awaiting further recommendations.  -CT abd/pelvis/cehst neg -? Dental source +dental caries on CT; ? atx try course of Augmentin  - + ANA, low TSH , but normal free T4, T3; pend ds dna   2. Seizure - Per neurology: continuing kepra 750 mg po bid while in hospital - Neurology on board. No breakthrough seizures reported.  3. Hypokalemia - magnesium level and WNL - resolved after replacement.   No driving, operating heavy machinery, perform activities at heights, swimming or participation in water activities until release by outpatient physician. This has been discussed with patient.  Code Status: full Family Communication: Discussed with patient and family member at bedside Disposition Plan: With cessation or amelioration of fevers   Consultants:  Neurology  ID  Procedures:  LP on admission  Antibiotics:  none  HPI/Subjective: No new complaints, no acute issues reported overnight.  Still spiking fevers.  Objective: Filed Vitals:   01/31/13 0602  BP: 87/72  Pulse: 98  Temp: 100.8 F (38.2 C)  Resp: 20    Intake/Output Summary (Last 24 hours) at 01/31/13 0836 Last data filed at 01/31/13 0603  Gross per 24 hour  Intake    240 ml  Output      0 ml  Net    240 ml   Filed Weights   01/24/13 2035 01/25/13 2023 01/26/13 2029  Weight: 46.8 kg (103 lb 2.8 oz) 46.6 kg (102 lb 11.8 oz) 46.601 kg (102 lb 11.8 oz)    Exam:   General:  Pt in NAD, Alert and awake  Cardiovascular: RRR, no MRG  Respiratory:  CTA BL, no wheezes  Abdomen: soft, NT, ND  Musculoskeletal: no cyanosis or clubbing  Data Reviewed: Basic Metabolic Panel:  Recent Labs Lab 01/25/13 0624 01/25/13 1449 01/26/13 0400  NA 135  --  139  K 3.2*  --  3.8  CL 100  --  104  CO2 24  --  27  GLUCOSE 113*  --  110*  BUN 6  --  4*  CREATININE 0.55  --  0.46*  CALCIUM 9.0  --  8.7  MG  --  2.1  --    Liver Function Tests: No results found for this basename: AST, ALT, ALKPHOS, BILITOT, PROT, ALBUMIN,  in the last 168 hours No results found for this basename: LIPASE, AMYLASE,  in the last 168 hours No results found for this basename: AMMONIA,  in the last 168 hours CBC:  Recent Labs Lab 01/25/13 0624  WBC 4.8  HGB 10.9*  HCT 32.0*  MCV 89.9  PLT 259   Cardiac Enzymes: No results found for this basename: CKTOTAL, CKMB, CKMBINDEX, TROPONINI,  in the last 168 hours BNP (last 3 results) No results found for this basename: PROBNP,  in the last 8760 hours CBG: No results found for this basename: GLUCAP,  in the last 168 hours  Recent Results (from the past 240 hour(s))  CULTURE, BLOOD (ROUTINE X 2)     Status: None   Collection Time    01/23/13 11:15  PM      Result Value Range Status   Specimen Description BLOOD LEFT ANTECUBITAL   Final   Special Requests     Final   Value: BOTTLES DRAWN AEROBIC AND ANAEROBIC AER 2.5cc ANA 2.5cc   Culture  Setup Time     Final   Value: 01/24/2013 02:12     Performed at [facility]   Culture     Final   Value: NO GROWTH 5 DAYS     Performed at [facility]   Report Status 01/30/2013 FINAL   Final  CULTURE, BLOOD (ROUTINE X 2)     Status: None   Collection Time    01/23/13 11:25 PM      Result Value Range Status   Specimen Description BLOOD LEFT WRIST   Final   Special Requests BOTTLES DRAWN AEROBIC ONLY AER 4cc   Final   Culture  Setup Time     Final   Value: 01/24/2013 02:11     Performed at [facility]   Culture     Final   Value: NO  GROWTH 5 DAYS     Performed at [facility]   Report Status 01/30/2013 FINAL   Final  CSF CULTURE     Status: None   Collection Time    01/23/13 11:30 PM      Result Value Range Status   Specimen Description CSF   Final   Special Requests Normal   Final   Gram Stain     Final   Value: WBC PRESENT, PREDOMINANTLY MONONUCLEAR     NO ORGANISMS SEEN     CYTOSPIN     Performed at [facility]   Culture     Final   Value: NO GROWTH 3 DAYS     Performed at [facility]   Report Status 01/27/2013 FINAL   Final     Studies: Dg Orthopantogram  01/30/2013   CLINICAL DATA:  Fever.  EXAM: ORTHOPANTOGRAM/PANORAMIC  COMPARISON:  None.  FINDINGS: There is poor dentition with multiple dental caries. Several teeth are fragmented. There is periodontal lucency, especially along the roots of the central mandibular incisors. The left mandibular wisdom tooth is impacted. There is no evidence of acute fracture or bone destruction.  IMPRESSION: Dental caries with tooth fragmentation and periodontal disease as described.   Electronically Signed   By: Roxy Horseman   On: 01/30/2013 23:58   Ct Chest W Contrast  01/31/2013   CLINICAL DATA:  Fever. Viral encephalitis.  EXAM: CT CHEST, ABDOMEN, AND PELVIS WITH CONTRAST  TECHNIQUE: Multidetector CT imaging of the chest, abdomen and pelvis was performed following the standard protocol during bolus administration of intravenous contrast.  CONTRAST:  80mL OMNIPAQUE IOHEXOL 300 MG/ML  SOLN  COMPARISON:  None.  FINDINGS: CT CHEST FINDINGS  There is no axillary, hilar or mediastinal lymphadenopathy. Heart size is normal. No pleural or pericardial effusion. The lungs are clear. No bony abnormality is identified.  CT ABDOMEN AND PELVIS FINDINGS  The liver, gallbladder, adrenal glands, spleen, pancreas and kidneys all appear normal. The patient is status post hysterectomy. The stomach, small and large bowel and appendix appear normal. Small left pelvic  sidewall node measures 0.8 cm short axis dimension. No pathologic lymphadenopathy by CT size criteria is identified. No focal bony abnormality is seen.  IMPRESSION: CT CHEST IMPRESSION  Negative chest CT.  CT ABDOMEN AND PELVIS IMPRESSION  No acute finding in the abdomen or pelvis. No  source for fever is identified.  Status post hysterectomy.   Electronically Signed   By: Drusilla Kanner M.D.   On: 01/31/2013 00:03   Ct Abdomen Pelvis W Contrast  01/31/2013   CLINICAL DATA:  Fever. Viral encephalitis.  EXAM: CT CHEST, ABDOMEN, AND PELVIS WITH CONTRAST  TECHNIQUE: Multidetector CT imaging of the chest, abdomen and pelvis was performed following the standard protocol during bolus administration of intravenous contrast.  CONTRAST:  80mL OMNIPAQUE IOHEXOL 300 MG/ML  SOLN  COMPARISON:  None.  FINDINGS: CT CHEST FINDINGS  There is no axillary, hilar or mediastinal lymphadenopathy. Heart size is normal. No pleural or pericardial effusion. The lungs are clear. No bony abnormality is identified.  CT ABDOMEN AND PELVIS FINDINGS  The liver, gallbladder, adrenal glands, spleen, pancreas and kidneys all appear normal. The patient is status post hysterectomy. The stomach, small and large bowel and appendix appear normal. Small left pelvic sidewall node measures 0.8 cm short axis dimension. No pathologic lymphadenopathy by CT size criteria is identified. No focal bony abnormality is seen.  IMPRESSION: CT CHEST IMPRESSION  Negative chest CT.  CT ABDOMEN AND PELVIS IMPRESSION  No acute finding in the abdomen or pelvis. No source for fever is identified.  Status post hysterectomy.   Electronically Signed   By: Drusilla Kanner M.D.   On: 01/31/2013 00:03    Scheduled Meds: . heparin  5,000 Units Subcutaneous Q8H  . levETIRAcetam  750 mg Oral BID  . lidocaine-EPINEPHrine  30 mL Other Once   Continuous Infusions:   Principal Problem:   Viral encephalitis Active Problems:   Seizures   Hypokalemia   Fever,  unspecified    Time spent: > 35 minutes    [name]  [facility] Pager [phone] If 7PM-7AM, please contact night-coverage at [email or web address], password [facility] 01/31/2013, 8:36 AM  LOS: 8 days

## 2013-02-01 MED ORDER — ACETAMINOPHEN 325 MG PO TABS: 650.0000 mg | ORAL_TABLET | Freq: Four times a day (QID) | ORAL | Status: DC | PRN

## 2013-02-01 MED ORDER — AMOXICILLIN-POT CLAVULANATE 875-125 MG PO TABS: 1.0000 | ORAL_TABLET | Freq: Two times a day (BID) | ORAL | Status: DC

## 2013-02-01 NOTE — Discharge Summary (Signed)
Physician Discharge Summary  Taylor Wood ZOX:[id number] DOB: [birth year] DOA: 01/23/2013  PCP: No PCP Per Patient  Admit date: 01/23/2013 Discharge date: 02/01/2013  Time spent: >35 minutes  minutes  Recommendations for Outpatient Follow-up:   F/u with Dr. Ninetta Lights in 1-2 weeks F/u with PCP in 1 week to schedule appointment with rheumatologist   Discharge Diagnoses:  Principal Problem:   Viral encephalitis Active Problems:   Seizures   Hypokalemia   Fever, unspecified   Discharge Condition: stable   Diet recommendation: regular   Filed Weights   01/24/13 2035 01/25/13 2023 01/26/13 2029  Weight: 46.8 kg (103 lb 2.8 oz) 46.6 kg (102 lb 11.8 oz) 46.601 kg (102 lb 11.8 oz)    History of present illness:  Patient is a 62 y/o female who presented with Viral encephalitis has had persistent fever    Hospital Course:   1. Viral aseptic meningitis  -HSV PCR negative as such acyclovir d/c'd  - Patient continues to spike high fevers 9/24-9/25. She is asymptomatic otherwise and non toxic appearing. Blood c/s neg;  -CT abd/pelvis/chest neg  -? Dental source +dental caries on CT; started on course of Augmentin  - + ANA, low TSH , but normal free T4, T3; neg ds dna; need outpatient rheumatology follow up  -cleared for d/c by ID, recommended to f/u in two weeks   2. Seizure  - Per neurology: continuing kepra 750 mg po bid while in hospital  - Neurology on board. No breakthrough seizures reported.   3. Hypokalemia  - magnesium level and WNL  - resolved after replacement.   No driving, operating heavy machinery, perform activities at heights, swimming or participation in water activities until release by outpatient physician. This has been discussed with patient.    Procedures:  CT abd/pel as above  (i.e. Studies not automatically included, echos, thoracentesis, etc; not x-rays)  Consultations:  Neurology, infectious disease   Discharge Exam: Filed Vitals:   02/01/13 0759   BP:   Pulse:   Temp: 99.2 F (37.3 C)  Resp:     General: alert Cardiovascular: s1,s2 rrr Respiratory: cta BL   Discharge Instructions  Discharge Orders   Future Orders Complete By Expires   Diet - low sodium heart healthy  As directed    Discharge instructions  As directed    Comments:     Follow up with infectious disease specialist in 1-2 weeks   Increase activity slowly  As directed        Medication List         acetaminophen 325 MG tablet  Commonly known as:  TYLENOL  Take 2 tablets (650 mg total) by mouth every 6 (six) hours as needed for pain or fever.     amoxicillin-clavulanate 875-125 MG per tablet  Commonly known as:  AUGMENTIN  Take 1 tablet by mouth every 12 (twelve) hours.     levETIRAcetam 750 MG tablet  Commonly known as:  KEPPRA  Take 750 mg by mouth every 12 (twelve) hours.       No Known Allergies     Follow-up Information   Follow up with Johny Sax, MD In 1 week.   Specialty:  Infectious Diseases   Contact information:   [address]  [address] [place] [address] [phone]       Follow up with No PCP Per Patient In 1 week.   Specialty:  General Practice   Contact information:   [address]  [address] [place] [address] [phone]        The results of significant diagnostics from this hospitalization (including imaging, microbiology, ancillary and laboratory) are listed below for reference.    Significant Diagnostic Studies: Dg Orthopantogram  01/30/2013   CLINICAL DATA:  Fever.  EXAM: ORTHOPANTOGRAM/PANORAMIC  COMPARISON:  None.  FINDINGS: There is poor dentition with multiple dental caries. Several teeth are fragmented. There is periodontal lucency, especially along the roots of the central mandibular incisors. The left mandibular wisdom tooth is impacted. There is no evidence of acute fracture or bone destruction.  IMPRESSION: Dental caries with tooth fragmentation and periodontal  disease as described.   Electronically Signed   By: Roxy Horseman   On: 01/30/2013 23:58   Dg Chest 2 View  01/24/2013   CLINICAL DATA:  Headache.  EXAM: CHEST  2 VIEW  COMPARISON:  CHEST x-ray 01/19/2013.  FINDINGS: Lung volumes are normal. No consolidative airspace disease. No pleural effusions. No pneumothorax. No pulmonary nodule or mass noted. Pulmonary vasculature and the cardiomediastinal silhouette are within normal limits. Atherosclerosis in the thoracic aorta.  IMPRESSION: 1.  No radiographic evidence of acute cardiopulmonary disease. 2. Atherosclerosis.   Electronically Signed   By: Trudie Reed M.D.   On: 01/24/2013 01:03   Ct Chest W Contrast  01/31/2013   CLINICAL DATA:  Fever. Viral encephalitis.  EXAM: CT CHEST, ABDOMEN, AND PELVIS WITH CONTRAST  TECHNIQUE: Multidetector CT imaging of the chest, abdomen and pelvis was performed following the standard protocol during bolus administration of intravenous contrast.  CONTRAST:  80mL OMNIPAQUE IOHEXOL 300 MG/ML  SOLN  COMPARISON:  None.  FINDINGS: CT CHEST FINDINGS  There is no axillary, hilar or mediastinal lymphadenopathy. Heart size is normal. No pleural or pericardial effusion. The lungs are clear. No bony abnormality is identified.  CT ABDOMEN AND PELVIS FINDINGS  The liver, gallbladder, adrenal glands, spleen, pancreas and kidneys all appear normal. The patient is status post hysterectomy. The stomach, small and large bowel and appendix appear normal. Small left pelvic sidewall node measures 0.8 cm short axis dimension. No pathologic lymphadenopathy by CT size criteria is identified. No focal bony abnormality is seen.  IMPRESSION: CT CHEST IMPRESSION  Negative chest CT.  CT ABDOMEN AND PELVIS IMPRESSION  No acute finding in the abdomen or pelvis. No source for fever is identified.  Status post hysterectomy.   Electronically Signed   By: Drusilla Kanner M.D.   On: 01/31/2013 00:03   Ct Abdomen Pelvis W Contrast  01/31/2013   CLINICAL  DATA:  Fever. Viral encephalitis.  EXAM: CT CHEST, ABDOMEN, AND PELVIS WITH CONTRAST  TECHNIQUE: Multidetector CT imaging of the chest, abdomen and pelvis was performed following the standard protocol during bolus administration of intravenous contrast.  CONTRAST:  80mL OMNIPAQUE IOHEXOL 300 MG/ML  SOLN  COMPARISON:  None.  FINDINGS: CT CHEST FINDINGS  There is no axillary, hilar or mediastinal lymphadenopathy. Heart size is normal. No pleural or pericardial effusion. The lungs are clear. No bony abnormality is identified.  CT ABDOMEN AND PELVIS FINDINGS  The liver, gallbladder, adrenal glands, spleen, pancreas and kidneys all appear normal. The patient is status post hysterectomy. The stomach, small and large bowel and appendix appear normal. Small left pelvic sidewall node measures 0.8 cm short axis dimension. No pathologic lymphadenopathy by CT size criteria is identified. No focal bony abnormality is seen.  IMPRESSION: CT CHEST IMPRESSION  Negative chest CT.  CT ABDOMEN AND PELVIS IMPRESSION  No acute finding in  the abdomen or pelvis. No source for fever is identified.  Status post hysterectomy.   Electronically Signed   By: Drusilla Kanner M.D.   On: 01/31/2013 00:03    Microbiology: Recent Results (from the past 240 hour(s))  CULTURE, BLOOD (ROUTINE X 2)     Status: None   Collection Time    01/23/13 11:15 PM      Result Value Range Status   Specimen Description BLOOD LEFT ANTECUBITAL   Final   Special Requests     Final   Value: BOTTLES DRAWN AEROBIC AND ANAEROBIC AER 2.5cc ANA 2.5cc   Culture  Setup Time     Final   Value: 01/24/2013 02:12     Performed at [facility]   Culture     Final   Value: NO GROWTH 5 DAYS     Performed at [facility]   Report Status 01/30/2013 FINAL   Final  CULTURE, BLOOD (ROUTINE X 2)     Status: None   Collection Time    01/23/13 11:25 PM      Result Value Range Status   Specimen Description BLOOD LEFT WRIST   Final   Special Requests  BOTTLES DRAWN AEROBIC ONLY AER 4cc   Final   Culture  Setup Time     Final   Value: 01/24/2013 02:11     Performed at [facility]   Culture     Final   Value: NO GROWTH 5 DAYS     Performed at [facility]   Report Status 01/30/2013 FINAL   Final  CSF CULTURE     Status: None   Collection Time    01/23/13 11:30 PM      Result Value Range Status   Specimen Description CSF   Final   Special Requests Normal   Final   Gram Stain     Final   Value: WBC PRESENT, PREDOMINANTLY MONONUCLEAR     NO ORGANISMS SEEN     CYTOSPIN     Performed at [facility]   Culture     Final   Value: NO GROWTH 3 DAYS     Performed at [facility]   Report Status 01/27/2013 FINAL   Final     Labs: Basic Metabolic Panel:  Recent Labs Lab 01/25/13 1449 01/26/13 0400  NA  --  139  K  --  3.8  CL  --  104  CO2  --  27  GLUCOSE  --  110*  BUN  --  4*  CREATININE  --  0.46*  CALCIUM  --  8.7  MG 2.1  --    Liver Function Tests: No results found for this basename: AST, ALT, ALKPHOS, BILITOT, PROT, ALBUMIN,  in the last 168 hours No results found for this basename: LIPASE, AMYLASE,  in the last 168 hours No results found for this basename: AMMONIA,  in the last 168 hours CBC: No results found for this basename: WBC, NEUTROABS, HGB, HCT, MCV, PLT,  in the last 168 hours Cardiac Enzymes: No results found for this basename: CKTOTAL, CKMB, CKMBINDEX, TROPONINI,  in the last 168 hours BNP: BNP (last 3 results) No results found for this basename: PROBNP,  in the last 8760 hours CBG: No results found for this basename: GLUCAP,  in the last 168 hours     Signed:  Jonette Mate N  [facility] 02/01/2013, 9:20 AM

## 2013-02-15 ENCOUNTER — Telehealth: Payer: Self-pay

## 2013-02-15 NOTE — Telephone Encounter (Signed)
Patient's daughter walked into the office on 02-13-13 with a FMLA form to be completed. She was told by a staff member she could bring the form to our office.  Her Mother has been out of work since hospital discharge due to continued symptoms and will need this for employment.   She has not yet been seen in the office.  Dr Ninetta Lights is the provider who saw her while in-patient and he is not here today.  I asked Daughter to call me with the employer information so that I could call to make sure it is ok for Dr Ninetta Lights to complete the form upon his return on 02-18-13. She left a voice mail for me on 02-14-13. I have called patient's employer and spoke with Lupita Leash in HR. She stated return of the form on the 13 th would be okay but it must be sent on this day.  Fax form to [phone] Attention: Lupita Leash  Phone: [phone]  The patient has been informed of the above.   Laurell Josephs, RN

## 2013-02-21 ENCOUNTER — Ambulatory Visit (INDEPENDENT_AMBULATORY_CARE_PROVIDER_SITE_OTHER): Payer: BC Managed Care – PPO | Admitting: Infectious Diseases

## 2013-02-21 ENCOUNTER — Encounter: Payer: Self-pay | Admitting: Infectious Diseases

## 2013-02-21 VITALS — BP 170/83 | HR 91 | Temp 98.1°F | Ht 60.0 in | Wt 103.0 lb

## 2013-02-21 DIAGNOSIS — K029 Dental caries, unspecified: Secondary | ICD-10-CM | POA: Insufficient documentation

## 2013-02-21 DIAGNOSIS — A89 Unspecified viral infection of central nervous system: Secondary | ICD-10-CM

## 2013-02-21 DIAGNOSIS — A86 Unspecified viral encephalitis: Secondary | ICD-10-CM

## 2013-02-21 DIAGNOSIS — R569 Unspecified convulsions: Secondary | ICD-10-CM

## 2013-02-21 MED ORDER — LEVETIRACETAM 750 MG PO TABS: 750.0000 mg | ORAL_TABLET | Freq: Two times a day (BID) | ORAL | Status: DC

## 2013-02-21 NOTE — Assessment & Plan Note (Signed)
Will try to get her into dental, PCP

## 2013-02-21 NOTE — Progress Notes (Signed)
  Subjective:    Patient ID: Taylor Wood, female    DOB: 1950/11/03, 62 y.o.   MRN: [id number]  HPI 62 y.o. F with PMHx of chronic headaches that she treats with "Goodies". The patient has had several family members who have been ill recently and she began to have "flu like sx" and was seeking out OTC meds. She apparently had a seizure at home and was brought to [facility] where she had a workup that reportedly included an MRI of the brain and EEG. She herself does not remember much of those events presumably due to post ictal state at that time. She was dc On keppra. She came to [facility] with fever to 103 and HA, Nausea and vomiting. She underwent LP that showed: 163 WBC, 74% Lymphs, glucose 68, protein 78  HSV 1, 2 PCR; state arbovirus panel, negative on CSF. At [facility] she improved. She was noted to have poor dentition, was tx with augmentin. She was d/c home 9-27.  Her course was also notable for ANA 1:80.  Has been feeling well except for occasionally nervous. Still occasionally has headaches. No fever, no change in vision. Teeth unchanged.   No seizures since her hospital admission.    Review of Systems  Constitutional: Negative for fever and chills.  Gastrointestinal: Negative for diarrhea and constipation.  Genitourinary: Negative for difficulty urinating.  Musculoskeletal: Negative for arthralgias and myalgias.       Objective:   Physical Exam  Constitutional: She appears well-developed and well-nourished.  HENT:  Mouth/Throat: No oropharyngeal exudate.  Eyes: EOM are normal. Pupils are equal, round, and reactive to light.  Neck: Neck supple.  Cardiovascular: Normal rate, regular rhythm and normal heart sounds.   Pulmonary/Chest: Effort normal and breath sounds normal.  Abdominal: Soft. Bowel sounds are normal. She exhibits no distension. There is no tenderness. There is no rebound.  Lymphadenopathy:    She has no cervical adenopathy.          Assessment & Plan:

## 2013-02-21 NOTE — Assessment & Plan Note (Signed)
Will refill her keppra and get her into PCP.

## 2013-02-21 NOTE — Assessment & Plan Note (Signed)
Her studies were negative except for ANA+ 1:80 (an unusual titer).  Will get her in with PCP.

## 2013-02-26 NOTE — Telephone Encounter (Signed)
Form filled out by Dr. Ninetta Lights, faxed to Lupita Leash at [phone] on 10/17 after office visit.  Patient notified. Original placed in box to be scanned into patient's chart. Andree Coss, RN

## 2013-02-27 ENCOUNTER — Encounter: Payer: Self-pay | Admitting: Physician Assistant

## 2013-02-27 ENCOUNTER — Ambulatory Visit (INDEPENDENT_AMBULATORY_CARE_PROVIDER_SITE_OTHER): Payer: BC Managed Care – PPO | Admitting: Physician Assistant

## 2013-02-27 VITALS — BP 140/86 | HR 90 | Temp 98.7°F | Resp 14 | Ht 60.0 in | Wt 102.5 lb

## 2013-02-27 DIAGNOSIS — R6889 Other general symptoms and signs: Secondary | ICD-10-CM

## 2013-02-27 DIAGNOSIS — A89 Unspecified viral infection of central nervous system: Secondary | ICD-10-CM

## 2013-02-27 DIAGNOSIS — Z0000 Encounter for general adult medical examination without abnormal findings: Secondary | ICD-10-CM

## 2013-02-27 DIAGNOSIS — A86 Unspecified viral encephalitis: Secondary | ICD-10-CM

## 2013-02-27 DIAGNOSIS — R569 Unspecified convulsions: Secondary | ICD-10-CM

## 2013-02-27 DIAGNOSIS — R899 Unspecified abnormal finding in specimens from other organs, systems and tissues: Secondary | ICD-10-CM

## 2013-02-27 NOTE — Assessment & Plan Note (Signed)
Will repeat ANA with reflex titer.  Other hospital workup for Autoimmune/Rheumatological dz negative.  If positive, will need referral to Rheumatology.

## 2013-02-27 NOTE — Assessment & Plan Note (Signed)
Resolved

## 2013-02-27 NOTE — Patient Instructions (Signed)
Please continue medications as prescribed.  Please return for labs within 1 week.  I will call you with your results.  You will be contacted by Neurologist office, [facility] (Mammogram) and GI (colonoscopy).  It is important that you see these providers.  We will schedule follow-up based on your lab results.  Please schedule appointment for PAP smear and pelvic exam.  DASH Diet The DASH diet stands for "Dietary Approaches to Stop Hypertension." It is a healthy eating plan that has been shown to reduce high blood pressure (hypertension) in as little as 14 days, while also possibly providing other significant health benefits. These other health benefits include reducing the risk of breast cancer after menopause and reducing the risk of type 2 diabetes, heart disease, colon cancer, and stroke. Health benefits also include weight loss and slowing kidney failure in patients with chronic kidney disease.  DIET GUIDELINES  Limit salt (sodium). Your diet should contain less than 1500 mg of sodium daily.  Limit refined or processed carbohydrates. Your diet should include mostly whole grains. Desserts and added sugars should be used sparingly.  Include small amounts of heart-healthy fats. These types of fats include nuts, oils, and tub margarine. Limit saturated and trans fats. These fats have been shown to be harmful in the body. CHOOSING FOODS  The following food groups are based on a 2000 calorie diet. See your Registered Dietitian for individual calorie needs. Grains and Grain Products (6 to 8 servings daily)  Eat More Often: Whole-wheat bread, brown rice, whole-grain or wheat pasta, quinoa, popcorn without added fat or salt (air popped).  Eat Less Often: White bread, white pasta, white rice, cornbread. Vegetables (4 to 5 servings daily)  Eat More Often: Fresh, frozen, and canned vegetables. Vegetables may be raw, steamed, roasted, or grilled with a minimal amount of fat.  Eat Less  Often/Avoid: Creamed or fried vegetables. Vegetables in a cheese sauce. Fruit (4 to 5 servings daily)  Eat More Often: All fresh, canned (in natural juice), or frozen fruits. Dried fruits without added sugar. One hundred percent fruit juice ( cup [237 mL] daily).  Eat Less Often: Dried fruits with added sugar. Canned fruit in light or heavy syrup. [employer], Fish, and Poultry (2 servings or less daily. One serving is 3 to 4 oz [85-114 g]).  Eat More Often: Ninety percent or leaner ground beef, tenderloin, sirloin. Round cuts of beef, chicken breast, [place] breast. All fish. Grill, bake, or broil your meat. Nothing should be fried.  Eat Less Often/Avoid: Fatty cuts of meat, [place], or chicken leg, thigh, or wing. Fried cuts of meat or fish. Dairy (2 to 3 servings)  Eat More Often: Low-fat or fat-free milk, low-fat plain or light yogurt, reduced-fat or part-skim cheese.  Eat Less Often/Avoid: Milk (whole, 2%).Whole milk yogurt. Full-fat cheeses. Nuts, Seeds, and Legumes (4 to 5 servings per week)  Eat More Often: All without added salt.  Eat Less Often/Avoid: Salted nuts and seeds, canned beans with added salt. Fats and Sweets (limited)  Eat More Often: Vegetable oils, tub margarines without trans fats, sugar-free gelatin. Mayonnaise and salad dressings.  Eat Less Often/Avoid: Coconut oils, palm oils, butter, stick margarine, cream, half and half, cookies, candy, pie. FOR MORE INFORMATION The Dash Diet Eating Plan: www.dashdiet.org Document Released: 04/14/2011 Document Revised: 07/18/2011 Document Reviewed: 04/14/2011 [facility] Patient Information 2014 [place], [place].

## 2013-02-27 NOTE — Assessment & Plan Note (Signed)
Will obtain fasting labs.  Patient declines flu and pneumonia vaccination.  Order placed for screening mammogram.  Referral to GI for colonoscopy placed.  Will obtain medical records from [facility].

## 2013-02-27 NOTE — Assessment & Plan Note (Signed)
Continue Keppra.  Referral to Neurology for assessment and possible discontinuation of medication.

## 2013-02-27 NOTE — Progress Notes (Signed)
Patient ID: Taylor Wood, female   DOB: Apr 15, 1951, 62 y.o.   MRN: [id number]  Patient presents to clinic today to establish care.  Acute Concerns: No current concerns.    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  Patietn currently on Keppra therapy.  Patient denies history of seizures prior to infection.  Workup in hospital within normal limits.  Patient is not followed by Neurology at the moment.  I have no current access to her hospital records.  Will obtain them.  Health Maintenance: Dental -- overdue.  Has appointment scheduled Vision -- overdue Mammogram -- never had mammogram. Denies family history of cancer.  Will need scheduled PAP -- overdue. Colonoscopy -- never had colonoscopy.  Denies family hx of cancer. Will need scheduled Immunizations -- up-to-date. Declines flu shot.  Declines pneumonia shot.  Past Medical History  Diagnosis Date  . Seizure   . HA (headache)   . Viral encephalitis   . Viral meningitis     Current Outpatient Prescriptions on File Prior to Visit  Medication Sig Dispense Refill  . acetaminophen (TYLENOL) 325 MG tablet Take 2 tablets (650 mg total) by mouth every 6 (six) hours as needed for pain or fever.      . levETIRAcetam (KEPPRA) 750 MG tablet Take 1 tablet (750 mg total) by mouth every 12 (twelve) hours.  120 tablet  3   No current facility-administered medications on file prior to visit.    No Known Allergies  Family History  Problem Relation Age of Onset  . Hypertension Mother   . Hypertension Father   . Heart disease Mother   . Diabetes Mother   . Diabetes Maternal Grandmother   . Alzheimer's disease Father   . Hypertension Maternal Aunt   . Diabetes Maternal Aunt   . Hypertension Sister   . Healthy Brother     x3  . Healthy Daughter     x4  . Healthy Son     x1    History   Social History  . Marital Status: Single    Spouse Name: N/A    Number of Children: N/A  . Years of Education: N/A   Social History Main Topics  . Smoking status: Never Smoker   . Smokeless tobacco: Never Used  . Alcohol Use: No  . Drug Use: No  . Sexual Activity: None   Other Topics Concern  . None   Social History Narrative  . None   Review of Systems  Constitutional: Positive for malaise/fatigue. Negative for fever, chills and weight loss.  HENT: Negative for ear discharge, ear pain, hearing loss and tinnitus.   Eyes: Negative for blurred vision, double vision, photophobia and pain.  Respiratory: Negative for cough, shortness of breath and wheezing.   Cardiovascular: Negative for chest pain and palpitations.  Gastrointestinal: Negative for heartburn, nausea, vomiting, abdominal pain, diarrhea, constipation, blood in stool and melena.  Genitourinary: Negative for dysuria, urgency, frequency, hematuria and flank pain.  Musculoskeletal: Negative for myalgias.  Neurological: Positive for seizures and headaches. Negative for dizziness, tingling and loss of consciousness.  Endo/Heme/Allergies: Negative for environmental allergies.  Psychiatric/Behavioral: Negative for depression, suicidal ideas, hallucinations and substance abuse. The patient is not nervous/anxious.    Filed Vitals:   02/27/13 1432  BP: 140/86  Pulse:   Temp:   Resp:     Physical Exam  Vitals reviewed. Constitutional: She is oriented to person, place, and time  and well-developed, well-nourished, and in no distress.  HENT:  Head: Normocephalic and atraumatic.  Right Ear: External ear normal.  Left Ear: External ear normal.  Nose: Nose normal.  Mouth/Throat: Oropharynx is clear and moist. No oropharyngeal exudate.  Tm WNL bilaterally.  Extremely poor dentition with evidence of multiple dental caries.  No sign of oral infection.  Eyes: Conjunctivae and EOM are normal. Pupils are equal, round, and reactive to light.  Neck: Normal range of motion. Neck supple. No  thyromegaly present.  Cardiovascular: Normal rate, regular rhythm, normal heart sounds and intact distal pulses.   Pulmonary/Chest: Effort normal and breath sounds normal. No respiratory distress. She has no wheezes. She has no rales. She exhibits no tenderness.  Abdominal: Soft. Bowel sounds are normal. She exhibits no distension and no mass. There is no tenderness. There is no rebound and no guarding.  Lymphadenopathy:    She has no cervical adenopathy.  Neurological: She is alert and oriented to person, place, and time. She has normal reflexes. No cranial nerve deficit. Coordination normal. GCS score is 15.  Skin: Skin is warm and dry. No rash noted.  Psychiatric: Affect normal.     Recent Results (from the past 2160 hour(s))  URINALYSIS, ROUTINE W REFLEX MICROSCOPIC     Status: Abnormal   Collection Time    01/23/13  9:07 PM      Result Value Range   Color, Urine YELLOW  YELLOW   APPearance CLEAR  CLEAR   Specific Gravity, Urine 1.043 (*) 1.005 - 1.030   pH 5.5  5.0 - 8.0   Glucose, UA NEGATIVE  NEGATIVE mg/dL   Hgb urine dipstick SMALL (*) NEGATIVE   Bilirubin Urine NEGATIVE  NEGATIVE   Ketones, ur NEGATIVE  NEGATIVE mg/dL   Protein, ur 30 (*) NEGATIVE mg/dL   Urobilinogen, UA 0.2  0.0 - 1.0 mg/dL   Nitrite NEGATIVE  NEGATIVE   Leukocytes, UA SMALL (*) NEGATIVE  URINE MICROSCOPIC-ADD ON     Status: None   Collection Time    01/23/13  9:07 PM      Result Value Range   Squamous Epithelial / LPF RARE  RARE   WBC, UA 3-6  <3 WBC/hpf   RBC / HPF 3-6  <3 RBC/hpf   Bacteria, UA RARE  RARE   Urine-Other MUCOUS PRESENT    CULTURE, BLOOD (ROUTINE X 2)     Status: None   Collection Time    01/23/13 11:15 PM      Result Value Range   Specimen Description BLOOD LEFT ANTECUBITAL     Special Requests       Value: BOTTLES DRAWN AEROBIC AND ANAEROBIC AER 2.5cc ANA 2.5cc   Culture  Setup Time       Value: 01/24/2013 02:12     Performed at [facility]   Culture        Value: NO GROWTH 5 DAYS     Performed at [facility]   Report Status 01/30/2013 FINAL    CBC WITH DIFFERENTIAL     Status: None   Collection Time    01/23/13 11:22 PM      Result Value Range   WBC 6.4  4.0 - 10.5 K/uL   Comment: WHITE COUNT CONFIRMED ON SMEAR   RBC 4.13  3.87 - 5.11 MIL/uL   Hemoglobin 12.6  12.0 - 15.0 g/dL   HCT 16.1  09.6 - 04.5 %   MCV 89.3  78.0 - 100.0 fL  MCH 30.5  26.0 - 34.0 pg   MCHC 34.1  30.0 - 36.0 g/dL   RDW 96.0  45.4 - 09.8 %   Platelets 288  150 - 400 K/uL   Comment: SPECIMEN CHECKED FOR CLOTS     PLATELET COUNT CONFIRMED BY SMEAR   Neutrophils Relative % 67  43 - 77 %   Lymphocytes Relative 23  12 - 46 %   Monocytes Relative 10  3 - 12 %   Eosinophils Relative 0  0 - 5 %   Basophils Relative 0  0 - 1 %   Neutro Abs 4.3  1.7 - 7.7 K/uL   Lymphs Abs 1.5  0.7 - 4.0 K/uL   Monocytes Absolute 0.6  0.1 - 1.0 K/uL   Eosinophils Absolute 0.0  0.0 - 0.7 K/uL   Basophils Absolute 0.0  0.0 - 0.1 K/uL   WBC Morphology WHITE COUNT CONFIRMED ON SMEAR     Smear Review LARGE PLATELETS PRESENT     Comment: PLATELET COUNT CONFIRMED BY SMEAR  BASIC METABOLIC PANEL     Status: Abnormal   Collection Time    01/23/13 11:22 PM      Result Value Range   Sodium 136  135 - 145 mEq/L   Potassium 2.9 (*) 3.5 - 5.1 mEq/L   Chloride 97  96 - 112 mEq/L   CO2 25  19 - 32 mEq/L   Glucose, Bld 126 (*) 70 - 99 mg/dL   BUN 10  6 - 23 mg/dL   Creatinine, Ser 1.19  0.50 - 1.10 mg/dL   Calcium 9.9  8.4 - 14.7 mg/dL   GFR calc non Af Amer >90  >90 mL/min   GFR calc Af Amer >90  >90 mL/min   Comment: (NOTE)     The eGFR has been calculated using the CKD EPI equation.     This calculation has not been validated in all clinical situations.     eGFR's persistently <90 mL/min signify possible Chronic Kidney     Disease.  CULTURE, BLOOD (ROUTINE X 2)     Status: None   Collection Time    01/23/13 11:25 PM      Result Value Range   Specimen Description BLOOD LEFT  WRIST     Special Requests BOTTLES DRAWN AEROBIC ONLY AER 4cc     Culture  Setup Time       Value: 01/24/2013 02:11     Performed at [facility]   Culture       Value: NO GROWTH 5 DAYS     Performed at [facility]   Report Status 01/30/2013 FINAL    CSF CELL COUNT WITH DIFFERENTIAL     Status: Abnormal   Collection Time    01/23/13 11:30 PM      Result Value Range   Tube # 1     Color, CSF COLORLESS  COLORLESS   Appearance, CSF CLEAR  CLEAR   Supernatant NOT INDICATED     RBC Count, CSF 8 (*) 0 /cu mm   WBC, CSF 163 (*) 0 - 5 /cu mm   Comment: CRITICAL RESULT CALLED TO, READ BACK BY AND VERIFIED WITH:     Talmage Nap RN [phone] 0303 EBANKS COLCLOUGH, S   Segmented Neutrophils-CSF 6  0 - 6 %   Lymphs, CSF 74  40 - 80 %   Monocyte-Macrophage-Spinal Fluid 20  15 - 45 %   Eosinophils, CSF 0  0 -  1 %   Comment: Performed at [facility]  CSF CULTURE     Status: None   Collection Time    01/23/13 11:30 PM      Result Value Range   Specimen Description CSF     Special Requests Normal     Gram Stain       Value: WBC PRESENT, PREDOMINANTLY MONONUCLEAR     NO ORGANISMS SEEN     CYTOSPIN     Performed at [facility]   Culture       Value: NO GROWTH 3 DAYS     Performed at [facility]   Report Status 01/27/2013 FINAL    PROTEIN AND GLUCOSE, CSF     Status: Abnormal   Collection Time    01/23/13 11:30 PM      Result Value Range   Glucose, CSF 68  43 - 76 mg/dL   Total  Protein, CSF 78 (*) 15 - 45 mg/dL  CSF CELL COUNT WITH DIFFERENTIAL     Status: Abnormal   Collection Time    01/23/13 11:30 PM      Result Value Range   Tube # 4     Color, CSF COLORLESS  COLORLESS   Appearance, CSF CLEAR  CLEAR   Supernatant NOT INDICATED     RBC Count, CSF 2 (*) 0 /cu mm   WBC, CSF 108 (*) 0 - 5 /cu mm   Comment: CRITICAL RESULT CALLED TO, READ BACK BY AND VERIFIED WITH:     Talmage Nap RN [phone] 0303 EBANKS COLCLOUGH, S   Segmented  Neutrophils-CSF 8 (*) 0 - 6 %   Lymphs, CSF 78  40 - 80 %   Monocyte-Macrophage-Spinal Fluid 14 (*) 15 - 45 %   Eosinophils, CSF 0  0 - 1 %   Comment: Performed at [facility]  HERPES SIMPLEX VIRUS(HSV) DNA BY PCR     Status: None   Collection Time    01/23/13 11:30 PM      Result Value Range   Specimen source hsv CSF     HSV 1 DNA Not Detected  Not Detected   HSV 2 DNA Not Detected  Not Detected   Comment: (NOTE)     Note:     This assay detects the presence of herpes simplex virus (HSV) DNA by     real-time polymerase chain reaction (PCR) amplification of the virus     polymerase gene.  A result "DETECTED" is reported if a fluorescent     signal for HSV DNA is detected. If HSV DNA is present, the virus is     further characterized into subtype 1 or subtype 2 according     type-specific differences in melting temperature. The assay is     performed in the presence of an internal PCR control to ensure     efficient sample extraction and the absence of PCR inhibitors in the     sample.     This test was developed and its performance characteristics have been     determined by [facility]. Performance characteristics refer     to the analytical performance of the test. This test has not been     cleared or approved by the [place] Food and Drug Administration. The FDA     has determined that such clearance or approval is not necessary. This     laboratory is certified under the Clinical Laboratory Improvement  Amendments of 1988 as qualified to perform high complexity clinical     laboratory testing.     Performed at [facility]  PATHOLOGIST SMEAR REVIEW     Status: None   Collection Time    01/23/13 11:30 PM      Result Value Range   Path Review No malignant cells identified.     Comment: Chronic and acute inflammatory     cells present.      Please correlate with clinical     impression and CSF cell count.     Reviewed by Elana Alm Hillard, MD      01/24/13     Performed at [facility]  ENTEROVIRUS PCR     Status: None   Collection Time    01/23/13 11:30 PM      Result Value Range   Enterovirus PCR Not Detected  Not Detected   Comment: (NOTE)     This assay is designed to detect multiple strains     of Enterovirus, including Enterovirus D68.     This test was developed and its performance     characteristics have been determined by [employer], [place], [place].     Performance characteristics refer to the     analytical performance of the test.     This test is performed pursuant to a license     agreement with [employer]   Source CSF     Comment: Performed at [facility]  CRYPTOCOCCAL ANTIGEN, CSF     Status: None   Collection Time    01/23/13 11:30 PM      Result Value Range   Crypto Ag NEGATIVE  NEGATIVE   Comment: CSF COLLECTED AT [facility]   Cryptococcal Ag Titer NOT INDICATED  NOT INDICATED  CG4 I-STAT (LACTIC ACID)     Status: None   Collection Time    01/23/13 11:35 PM      Result Value Range   Lactic Acid, Venous 1.96  0.5 - 2.2 mmol/L  CBC     Status: Abnormal   Collection Time    01/25/13  6:24 AM      Result Value Range   WBC 4.8  4.0 - 10.5 K/uL   RBC 3.56 (*) 3.87 - 5.11 MIL/uL   Hemoglobin 10.9 (*) 12.0 - 15.0 g/dL   HCT 47.8 (*) 29.5 - 62.1 %   MCV 89.9  78.0 - 100.0 fL   MCH 30.6  26.0 - 34.0 pg   MCHC 34.1  30.0 - 36.0 g/dL   RDW 30.8  65.7 - 84.6 %   Platelets 259  150 - 400 K/uL  BASIC METABOLIC PANEL     Status: Abnormal   Collection Time    01/25/13  6:24 AM      Result Value Range   Sodium 135  135 - 145 mEq/L   Potassium 3.2 (*) 3.5 - 5.1 mEq/L   Chloride 100  96 - 112 mEq/L   CO2 24  19 - 32 mEq/L   Glucose, Bld 113 (*) 70 - 99 mg/dL   BUN 6  6 - 23 mg/dL   Creatinine, Ser 9.62  0.50 - 1.10 mg/dL   Calcium 9.0  8.4 - 95.2 mg/dL   GFR calc non Af Amer >90  >90 mL/min   GFR calc Af Amer >90  >90 mL/min   Comment: (NOTE)      The eGFR has  been calculated using the CKD EPI equation.     This calculation has not been validated in all clinical situations.     eGFR's persistently <90 mL/min signify possible Chronic Kidney     Disease.  MAGNESIUM     Status: None   Collection Time    01/25/13  2:49 PM      Result Value Range   Magnesium 2.1  1.5 - 2.5 mg/dL  BASIC METABOLIC PANEL     Status: Abnormal   Collection Time    01/26/13  4:00 AM      Result Value Range   Sodium 139  135 - 145 mEq/L   Potassium 3.8  3.5 - 5.1 mEq/L   Chloride 104  96 - 112 mEq/L   CO2 27  19 - 32 mEq/L   Glucose, Bld 110 (*) 70 - 99 mg/dL   BUN 4 (*) 6 - 23 mg/dL   Creatinine, Ser 4.78 (*) 0.50 - 1.10 mg/dL   Calcium 8.7  8.4 - 29.5 mg/dL   GFR calc non Af Amer >90  >90 mL/min   GFR calc Af Amer >90  >90 mL/min   Comment: (NOTE)     The eGFR has been calculated using the CKD EPI equation.     This calculation has not been validated in all clinical situations.     eGFR's persistently <90 mL/min signify possible Chronic Kidney     Disease.  RPR     Status: None   Collection Time    01/27/13 12:35 PM      Result Value Range   RPR NON REACTIVE  NON REACTIVE   Comment: Performed at [facility]  HIV ANTIBODY (ROUTINE TESTING)     Status: None   Collection Time    01/27/13 12:35 PM      Result Value Range   HIV NON REACTIVE  NON REACTIVE   Comment: Performed at [facility]  HIV 1 RNA QUANT-NO REFLEX-BLD     Status: None   Collection Time    01/27/13  5:05 PM      Result Value Range   HIV 1 RNA Quant <20  <20 copies/mL   HIV1 RNA Quant, Log <1.30  <1.30 log 10   Comment: (NOTE)     This test utilizes the [place] FDA approved Roche HIV-1 Test Kit by RT-PCR.     Performed at [facility]  CRYPTOCOCCAL ANTIGEN     Status: None   Collection Time    01/27/13  5:05 PM      Result Value Range   Crypto Ag NEGATIVE  NEGATIVE   Cryptococcal Ag Titer NOT INDICATED  NOT INDICATED  WEST NILE VIRUS AB, IGG AND IGM      Status: None   Collection Time    01/28/13  7:03 PM      Result Value Range   West Nile Virus [facility] 9.25.14     Comment: SEE SEPARATE REPORT     Performed by [employer] [facility]  ANA     Status: Abnormal   Collection Time    01/29/13  2:43 PM      Result Value Range   ANA POSITIVE (*) NEGATIVE   Comment: Performed at [facility]  TSH     Status: Abnormal   Collection Time    01/29/13  2:43 PM      Result Value Range   TSH 0.177 (*) 0.350 - 4.500 uIU/mL  Comment: Performed at [facility]  RHEUMATOID FACTOR     Status: None   Collection Time    01/29/13  2:43 PM      Result Value Range   Rheumatoid Factor <10  <=14 IU/mL   Comment: (NOTE)                             Interpretive Table                        Low Positive: 15 - 41 IU/mL                        High Positive:  >= 42 IU/mL     In addition to the RF result, and clinical symptoms including joint     involvement, the 2010 ACR Classification Criteria for     scoring/diagnosing Rheumatoid Arthritis include the results of the     following tests:  CRP (40981), ESR (15010), and CCP (APCA) (19147).     www.rheumatology.org/practice/clinical/classification/ra/ra_2010.asp     Performed at [facility]  ANTI-NUCLEAR AB-TITER (ANA TITER)     Status: Abnormal   Collection Time    01/29/13  2:43 PM      Result Value Range   ANA Titer 1 1:80 (*) <1:40   Comment: (NOTE)     Reference Ranges:     1:40 - 1:80 Weakly positive, usually not clinically significant.     > or = to 1:160 Result may be clinically significant.                                                                             ANA Pattern 1 HOMOGENOUS     Comment: Performed at [facility]  T4, FREE     Status: None   Collection Time    01/30/13  3:06 PM      Result Value Range   Free T4 1.21  0.80 - 1.80 ng/dL   Comment: Performed at [facility]  ANTI-DNA ANTIBODY, DOUBLE-STRANDED     Status:  None   Collection Time    01/30/13  3:06 PM      Result Value Range   ds DNA Ab 3  <30 IU/mL   Comment: (NOTE)                 <  30 IU/mL     Negative                 30-40 IU/mL     Equivocal                 >  40 IU/mL     Positive     Performed at [facility]  T3, FREE     Status: None   Collection Time    01/30/13  3:06 PM      Result Value Range   T3, Free 3.4  2.3 - 4.2 pg/mL   Comment: Performed at [facility]  SEDIMENTATION RATE     Status: Abnormal   Collection Time    01/30/13  3:06 PM      Result Value Range   Sed Rate 47 (*) 0 - 22 mm/hr  C-REACTIVE PROTEIN     Status: Abnormal   Collection Time    01/30/13  3:06 PM      Result Value Range   CRP <0.5 (*) <0.60 mg/dL   Comment: Performed at [facility]    Assessment/Plan: Encounter for preventive health examination Will obtain fasting labs.  Patient declines flu and pneumonia vaccination.  Order placed for screening mammogram.  Referral to GI for colonoscopy placed.  Will obtain medical records from [facility].  Seizures Continue Keppra.  Referral to Neurology for assessment and possible discontinuation of medication.  Viral encephalitis Resolved.    Abnormal laboratory test result Will repeat ANA with reflex titer.  Other hospital workup for Autoimmune/Rheumatological dz negative.  If positive, will need referral to Rheumatology.

## 2013-03-07 ENCOUNTER — Telehealth: Payer: Self-pay | Admitting: *Deleted

## 2013-03-07 NOTE — Telephone Encounter (Signed)
Patient called requesting a note from Dr. Ninetta Lights to be out of work for a longer period. Patient does not have a follow up at this clinic and per last office note it states patient is feeling well. Advised her to be seen at [facility] as this is who she is using as her PCP. Patient agreed to this. Wendall Mola

## 2013-03-07 NOTE — Telephone Encounter (Signed)
Patient called inquiring as to "could she stay on her medical leave?"; after reviewing patient's chart, she had also called Infectious Disease Clinic this morning with the same request:  Call Documentation    Wendall Mola, CMA at 03/07/2013 9:47 AM    Status: Signed             Patient called requesting a note from Dr. Ninetta Lights to be out of work for a longer period. Patient does not have a follow up at this clinic and per last office note it states patient is feeling well. Advised her to be seen at [facility] as this is who she is using as her PCP. Patient agreed to this.  Wendall Mola   Upon calling patient back, I was informed that she has made an upcoming appt w/Neurology for November 2014 [seizures] and that she believed that Selena Batten was going to take over mgt of issues currently managed by Infectious Disease Clinic: Call Documentation    Andree Coss, RN at 02/26/2013 11:01 AM    Status: Signed             Form filled out by Dr. Ninetta Lights, faxed to Lupita Leash at [phone] on 10/17 after office visit. Patient notified. Original placed in box to be scanned into patient's chart.  Andree Coss, RN         Tomasita Morrow, RN at 02/15/2013 8:29 AM    Status: Signed             Patient's daughter walked into the office on 02-13-13 with a FMLA form to be completed. She was told by a staff member she could bring the form to our office.  Her Mother has been out of work since hospital discharge due to continued symptoms and will need this for employment.  She has not yet been seen in the office. Dr Ninetta Lights is the provider who saw her while in-patient and he is not here today.  I asked Daughter to call me with the employer information so that I could call to make sure it is ok for Dr Ninetta Lights to complete the form upon his return on 02-18-13.  She left a voice mail for me on 02-14-13.  I have called patient's employer and spoke with Lupita Leash in HR. She stated return of the form  on the 13 th would be okay but it must be sent on this day.  Fax form to [phone] Attention: Lupita Leash  Phone: [phone]  The patient has been informed of the above.  Laurell Josephs, RN    Patient has not been to lab for orders placed by provider & was informed that provider agreed to take over mgt if requested to by provider at [facility], this has not been requested and last note states that she was doing well from [facility]; but if she wants PCP to help with Medical Leave from work, then an appt w/labs for A&E will be necessary prior. Pt understood & agreed and scheduled appt for Fri, 10.31.14 at 8:15a/SLS

## 2013-03-08 ENCOUNTER — Ambulatory Visit: Payer: BC Managed Care – PPO | Admitting: Physician Assistant

## 2013-03-08 DIAGNOSIS — Z0289 Encounter for other administrative examinations: Secondary | ICD-10-CM

## 2013-03-15 ENCOUNTER — Encounter: Payer: Self-pay | Admitting: Infectious Diseases

## 2013-03-20 ENCOUNTER — Ambulatory Visit: Payer: BC Managed Care – PPO | Admitting: Neurology

## 2013-04-01 ENCOUNTER — Encounter: Payer: Self-pay | Admitting: Physician Assistant

## 2013-04-17 ENCOUNTER — Ambulatory Visit (INDEPENDENT_AMBULATORY_CARE_PROVIDER_SITE_OTHER): Payer: BC Managed Care – PPO | Admitting: Neurology

## 2013-04-17 ENCOUNTER — Encounter: Payer: Self-pay | Admitting: Neurology

## 2013-04-17 VITALS — BP 140/70 | HR 78 | Ht 60.0 in | Wt 104.0 lb

## 2013-04-17 DIAGNOSIS — A879 Viral meningitis, unspecified: Secondary | ICD-10-CM

## 2013-04-17 DIAGNOSIS — R569 Unspecified convulsions: Secondary | ICD-10-CM

## 2013-04-17 DIAGNOSIS — G030 Nonpyogenic meningitis: Secondary | ICD-10-CM

## 2013-04-17 MED ORDER — LEVETIRACETAM 500 MG PO TABS: ORAL_TABLET | ORAL | Status: DC

## 2013-04-17 NOTE — Progress Notes (Signed)
NEUROLOGY CONSULTATION NOTE  Taylor Wood MRN: [id number] DOB: [birth year]  Referring provider: Piedad Climes, PA-C Primary care provider: Piedad Climes, PA-C  Reason for consult:  Seizure.  HISTORY OF PRESENT ILLNESS: Taylor Wood is a 62 year old right-handed woman with history of chronic headaches who presents for seizure in setting of aseptic meningitis.  She is accompanied by her daughter.  Records and images were personally reviewed where available.   On 01/21/13, she had a new onset seizure.  She was at home.  Her other daughter heard her fall and when she came into the room, she was convulsing.  She did not feel ill prior to this.  She was admitted to [facility] where she exhibited a low-grade fever but she did not present with sign of infection.  MRI of brain and EEG were reportedly negative.  She was started on Keppra 750mg  twice daily and discharged.  She presented to [facility] ED the following week with elevated temperature and headache.  Her temperature was 103.1.  She was started empirically on Acyclovir.  LP was performed and CSF revealed protein 78, glucose 68, culture negative, WBC 108 and 163, neutrophils 6 and 8, lymphs 74 and 78, monocytes 20 and 14, no eosinophils and RBC 8.  Blood cultures negative.  CT of chest, abdomen and pelvis negative.  CT revealed questionable dental caries, so she was started on Augmentin.  She was found to have an ANA 1:80, negative ds-DNA, ESR 47, CRP <0.5, RF negative,  TSH 0.177 but normal free T3, T4.  She was followed by both neurology and ID.  HSV PCR was negative, so acyclovir was discontinued.  She was subsequently discharged and has not had any further seizures.  She has been doing well.  She denies headache or fevers.  She has not been driving, as instructed.    She has no prior history of seizures or head trauma.  Her daughter has epilepsy.  PAST MEDICAL HISTORY: Past Medical History  Diagnosis Date  . Prostate cancer     PAST  SURGICAL HISTORY: Past Surgical History  Procedure Laterality Date  . Prostate surgery    . Prostate surgery    . Orchiopexy      MEDICATIONS: Current Outpatient Prescriptions on File Prior to Visit  Medication Sig Dispense Refill  . amLODipine (NORVASC) 2.5 MG tablet Take 1 tablet (2.5 mg total) by mouth daily.  30 tablet  3  . aspirin EC 81 MG tablet Take 81 mg by mouth daily.      . fluticasone (FLONASE) 50 MCG/ACT nasal spray Place 1 spray into the nose daily.  16 g  2  . GARLIC PO Take 1 capsule by mouth daily.      . Red Yeast Rice Extract (RED YEAST RICE PO) Take 1 capsule by mouth daily.      Marland Kitchen thiamine 100 MG tablet Take 1 tablet (100 mg total) by mouth daily.  30 tablet  3   No current facility-administered medications on file prior to visit.    ALLERGIES: Allergies  Allergen Reactions  . Sulfa Antibiotics     FAMILY HISTORY: Family History  Problem Relation Age of Onset  . Heart attack Father   . Colon cancer Brother     SOCIAL HISTORY: History   Social History  . Marital Status: Divorced    Spouse Name: N/A    Number of Children: N/A  . Years of Education: N/A   Occupational History  . Not  on file.   Social History Main Topics  . Smoking status: Never Smoker   . Smokeless tobacco: Never Used  . Alcohol Use: Yes     Comment: occasional  . Drug Use: No  . Sexual Activity: Not on file   Other Topics Concern  . Not on file   Social History Narrative  . No narrative on file    REVIEW OF SYSTEMS: Constitutional: No fevers, chills, or sweats, no generalized fatigue, change in appetite Eyes: No visual changes, double vision, eye pain Ear, nose and throat: No hearing loss, ear pain, nasal congestion, sore throat Cardiovascular: No chest pain, palpitations Respiratory:  No shortness of breath at rest or with exertion, wheezes GastrointestinaI: No nausea, vomiting, diarrhea, abdominal pain, fecal incontinence Genitourinary:  No dysuria, urinary  retention or frequency Musculoskeletal:  No neck pain, back pain Integumentary: No rash, pruritus, skin lesions Neurological: as above Psychiatric: No depression, insomnia, anxiety Endocrine: No palpitations, fatigue, diaphoresis, mood swings, change in appetite, change in weight, increased thirst Hematologic/Lymphatic:  No anemia, purpura, petechiae. Allergic/Immunologic: no itchy/runny eyes, nasal congestion, recent allergic reactions, rashes  PHYSICAL EXAM: There were no vitals filed for this visit. General: No acute distress Head:  Normocephalic/atraumatic Neck: supple, no paraspinal tenderness, full range of motion Back: No paraspinal tenderness Heart: regular rate and rhythm Lungs: Clear to auscultation bilaterally. Vascular: No carotid bruits. Neurological Exam: Mental status: alert and oriented to person, place, and time, speech fluent and not dysarthric, language intact. Cranial nerves: CN I: not tested CN II: pupils equal, round and reactive to light, visual fields intact, fundi unremarkable. CN III, IV, VI:  full range of motion, no nystagmus, no ptosis CN V: facial sensation intact CN VII: upper and lower face symmetric CN VIII: hearing intact CN IX, X: gag intact, uvula midline CN XI: sternocleidomastoid and trapezius muscles intact CN XII: tongue midline Bulk & Tone: normal, no fasciculations. Motor: 5/5 throughout Sensation: temperature and vibration intact. Deep Tendon Reflexes: areflexive throughout.  Toes down Finger to nose testing: normal Heel to shin: normal Gait: normal stance and stride.  Able to walk in tandem. Romberg negative.  IMPRESSION: Probable isolated provoked seizure secondary to aseptic meningitis.  Seizure workup was negative.  PLAN: 1.  I think we can slower taper off Keppra over 2 weeks. 2.  She should continue not to drive. 3.  Return in 4 weeks.  If she is still doing well, then I think she can resume driving since this seems to be a  provoked episode.  45 minutes spent with patient and daughter, over 50% spent counseling and coordinating care.  Thank you for allowing me to take part in the care of this patient.  Shon Millet, DO  CC:  Piedad Climes, PA-C

## 2013-04-17 NOTE — Patient Instructions (Signed)
I think we can slowly get you off the Keppra since the seizure was likely due to the meningitis and all your tests (EEG, MRI) were normal. 1.  Stop the Keppra 750mg  tablets. 2.  Start Keppra 500mg  tablets.  Take 1 tablet twice daily for 7 days, then 1 tablet at bedtime for 7 days, then STOP. 3.  Do not drive.  Follow up in 4 weeks.  If everything is well, then I can clear you to resume driving.

## 2013-05-15 ENCOUNTER — Ambulatory Visit (INDEPENDENT_AMBULATORY_CARE_PROVIDER_SITE_OTHER): Payer: BC Managed Care – PPO | Admitting: Neurology

## 2013-05-15 ENCOUNTER — Encounter: Payer: Self-pay | Admitting: Neurology

## 2013-05-15 VITALS — BP 140/78 | HR 80 | Temp 98.2°F | Ht 60.0 in | Wt 105.0 lb

## 2013-05-15 DIAGNOSIS — R569 Unspecified convulsions: Secondary | ICD-10-CM

## 2013-05-15 NOTE — Progress Notes (Signed)
NEUROLOGY FOLLOW UP OFFICE NOTE  Taylor Wood [id number]  HISTORY OF PRESENT ILLNESS: Taylor Wood is a 63 year old right-handed woman with history of chronic headaches who follows up for seizure in setting of aseptic meningitis.  She is accompanied by her daughter.  Records and images were personally reviewed where available.    On 01/21/13, she had a new onset seizure.  She was at home.  Her other daughter heard her fall and when she came into the room, she was convulsing.  She did not feel ill prior to this.  She was admitted to [facility] where she exhibited a low-grade fever but she did not present with sign of infection.  MRI of brain and EEG were reportedly negative.  She was started on Keppra 757m twice daily and discharged.  [facility] and [facility].  Her temperature was 103.1.  She was started empirically on Acyclovir.  LP was performed and CSF revealed protein 78, glucose 68, culture negative, WBC 108 and 163, neutrophils 6 and 8, lymphs 74 and 78, monocytes 20 and 14, no eosinophils and RBC 8.  Blood cultures negative.  CT of chest, abdomen and pelvis negative.  CT revealed questionable dental caries, so she was started on Augmentin.  She was found to have an ANA 1:80, negative ds-DNA, ESR 47, CRP <0.5, RF negative,  TSH 0.177 but normal free T3, T4.  She was followed by both neurology and ID.  HSV PCR was negative, so acyclovir was discontinued.  She has not been driving, as instructed.  A month ago, she slowly tapered off Keppra and has been doing well.  No further seizures.  She has no prior history of seizures or head trauma.  Her daughter has epilepsy.  PAST MEDICAL HISTORY: Past Medical History  Diagnosis Date  . Seizure   . HA (headache)   . Viral encephalitis   . Viral meningitis     MEDICATIONS: Current Outpatient Prescriptions on File Prior to Visit  Medication Sig Dispense Refill  . acetaminophen (TYLENOL) 325 MG  tablet Take 2 tablets (650 mg total) by mouth every 6 (six) hours as needed for pain or fever.      . Aspirin-Acetaminophen-Caffeine (GOODY HEADACHE PO) Take by mouth as needed.      . levETIRAcetam (KEPPRA) 500 MG tablet Take 1tab BID for 7 days, then 1tab daily for 7 days, then stop.  21 tablet  0   No current facility-administered medications on file prior to visit.    ALLERGIES: No Known Allergies  FAMILY HISTORY: Family History  Problem Relation Age of Onset  . Hypertension Mother   . Heart disease Mother   . Diabetes Mother   . Hypertension Father   . Alzheimer's disease Father   . Diabetes Maternal Grandmother   . Hypertension Maternal Aunt   . Diabetes Maternal Aunt   . Hypertension Sister   . Healthy Brother     x3  . Healthy Daughter     x4  . Seizures Daughter   . Healthy Son     x1    SOCIAL HISTORY: History   Social History  . Marital Status: Single    Spouse Name: N/A    Number of Children: N/A  . Years of Education: N/A   Occupational History  . Not on file.   Social History Main Topics  . Smoking status: Never Smoker   . Smokeless tobacco: Never Used  .  Alcohol Use: No  . Drug Use: No  . Sexual Activity: Not on file   Other Topics Concern  . Not on file   Social History Narrative  . No narrative on file    REVIEW OF SYSTEMS: Constitutional: No fevers, chills, or sweats, no generalized fatigue, change in appetite Eyes: No visual changes, double vision, eye pain Ear, nose and throat: No hearing loss, ear pain, nasal congestion, sore throat Cardiovascular: No chest pain, palpitations Respiratory:  No shortness of breath at rest or with exertion, wheezes GastrointestinaI: No nausea, vomiting, diarrhea, abdominal pain, fecal incontinence Genitourinary:  No dysuria, urinary retention or frequency Musculoskeletal:  No neck pain, back pain Integumentary: No rash, pruritus, skin lesions Neurological: as above Psychiatric: No depression,  insomnia, anxiety Endocrine: No palpitations, fatigue, diaphoresis, mood swings, change in appetite, change in weight, increased thirst Hematologic/Lymphatic:  No anemia, purpura, petechiae. Allergic/Immunologic: no itchy/runny eyes, nasal congestion, recent allergic reactions, rashes  PHYSICAL EXAM: Filed Vitals:   05/15/13 1029  BP: 140/78  Pulse: 80  Temp: 98.2 F (36.8 C)   General: No acute distress Head:  Normocephalic/atraumatic Neck: supple, no paraspinal tenderness, full range of motion Heart:  Regular rate and rhythm Lungs:  Clear to auscultation bilaterally Back: No paraspinal tenderness Neurological Exam: alert and oriented to person, place, and time. Speech fluent and not dysarthric, language intact.  CN II-XII intact. Bulk and tone normal, muscle strength 5/5 throughout.  Sensation to light touch intact.  Deep tendon reflexes 2+ throughout,.  Finger to nose intact.  Gait normal, Romberg negative.  IMPRESSION: Isolated provoked seizure secondary to aseptic meningitis.   PLAN: 1.  No need for anticonvulsant therapy 2.  As this was a provoked seizure due to meningitis, which has since resolved, and seizure workup is normal, she may resume driving.. 3.  No further workup or follow up needed.  Metta Clines, DO  CC:  Leeanne Rio, PA-C

## 2013-05-15 NOTE — Patient Instructions (Signed)
Since you are doing well off Keppra, and that your tests are all normal, I think you can resume driving because I feel the seizure was specifically due to the meningitis.  No further follow up unless needed.

## 2014-01-25 ENCOUNTER — Emergency Department (HOSPITAL_BASED_OUTPATIENT_CLINIC_OR_DEPARTMENT_OTHER)
Admission: EM | Admit: 2014-01-25 | Discharge: 2014-01-25 | Disposition: A | Discharge status: Home / Self Care | Payer: BC Managed Care – PPO | Attending: Emergency Medicine | Admitting: Emergency Medicine

## 2014-01-25 ENCOUNTER — Encounter (HOSPITAL_BASED_OUTPATIENT_CLINIC_OR_DEPARTMENT_OTHER): Payer: Self-pay | Admitting: Emergency Medicine

## 2014-01-25 ENCOUNTER — Emergency Department (HOSPITAL_BASED_OUTPATIENT_CLINIC_OR_DEPARTMENT_OTHER): Payer: BC Managed Care – PPO

## 2014-01-25 DIAGNOSIS — Z8619 Personal history of other infectious and parasitic diseases: Secondary | ICD-10-CM | POA: Insufficient documentation

## 2014-01-25 DIAGNOSIS — M25511 Pain in right shoulder: Secondary | ICD-10-CM

## 2014-01-25 DIAGNOSIS — M25519 Pain in unspecified shoulder: Secondary | ICD-10-CM | POA: Diagnosis not present

## 2014-01-25 MED ORDER — CYCLOBENZAPRINE HCL 10 MG PO TABS: 10.0000 mg | ORAL_TABLET | Freq: Two times a day (BID) | ORAL | Status: DC | PRN

## 2014-01-25 MED ORDER — IBUPROFEN 800 MG PO TABS: 800.0000 mg | ORAL_TABLET | Freq: Three times a day (TID) | ORAL | Status: DC

## 2014-01-25 NOTE — ED Notes (Signed)
Patient here with increasing right shoulder and arm pain x 1 week. Reports that she works in [employer] and wonders if the repetitive motion is causing the pain, no other trauma

## 2014-01-25 NOTE — ED Provider Notes (Signed)
CSN: [id number]     Arrival date & time 01/25/14  1128 History   First MD Initiated Contact with Patient 01/25/14 1218     Chief Complaint  Patient presents with  . Shoulder Pain     (Consider location/radiation/quality/duration/timing/severity/associated sxs/prior Treatment) Patient is a 63 y.o. female presenting with shoulder pain. The history is provided by the patient. No language interpreter was used.  Shoulder Pain This is a new problem. The current episode started in the past 7 days. The problem occurs constantly. Pertinent negatives include no abdominal pain, chest pain, chills, fever, nausea or numbness. Associated symptoms comments: Right shoulder pain without known injury for the past 5 days. It is worse with movement and affects the posterior shoulder. There is aching in the wrist more than usual aching she has with ?carpal tunnel syndrome. No swelling, redness, tingling or numbness of the right UE. No neck pain..    Past Medical History  Diagnosis Date  . Seizure   . HA (headache)   . Viral encephalitis   . Viral meningitis    Past Surgical History  Procedure Laterality Date  . Unremarkable.     Family History  Problem Relation Age of Onset  . Hypertension Mother   . Heart disease Mother   . Diabetes Mother   . Hypertension Father   . Alzheimer's disease Father   . Diabetes Maternal Grandmother   . Hypertension Maternal Aunt   . Diabetes Maternal Aunt   . Hypertension Sister   . Healthy Brother     x3  . Healthy Daughter     x4  . Seizures Daughter   . Healthy Son     x1   History  Substance Use Topics  . Smoking status: Never Smoker   . Smokeless tobacco: Never Used  . Alcohol Use: No   OB History   Grav Para Term Preterm Abortions TAB SAB Ect Mult Living                 Review of Systems  Constitutional: Negative for fever and chills.  Respiratory: Negative.  Negative for shortness of breath.   Cardiovascular: Negative.  Negative for chest  pain.  Gastrointestinal: Negative.  Negative for nausea and abdominal pain.  Musculoskeletal:       See HPI.  Skin: Negative.  Negative for color change and wound.  Neurological: Negative.  Negative for numbness.      Allergies  Review of patient's allergies indicates no known allergies.  Home Medications   Prior to Admission medications   Not on File   BP 141/67  Pulse 88  Temp(Src) 98.6 F (37 C) (Oral)  Resp 18  Ht  (1.549 m)  Wt 105 lb (47.628 kg)  BMI 19.85 kg/m2  SpO2 98% Physical Exam  Constitutional: She is oriented to person, place, and time. She appears well-developed and well-nourished.  Neck: Normal range of motion.  Cardiovascular: Normal rate and intact distal pulses.   Pulmonary/Chest: Effort normal.  Musculoskeletal:  Right shoulder unremarkable in appearance without swelling or discoloration. FROM without strength deficits. 5/5 grip strength bilateral UE's. No midline or paracervical tenderness.   Neurological: She is alert and oriented to person, place, and time.  Neurosensory exam intact bilateral upper extremities.   Skin: Skin is warm and dry.    ED Course  Procedures (including critical care time) Labs Review Labs Reviewed - No data to display  Imaging Review Dg Shoulder Right  01/25/2014   CLINICAL DATA:  Right shoulder pain.  No trauma.  EXAM: RIGHT SHOULDER - 2+ VIEW  COMPARISON:  None.  FINDINGS: Normal anatomic alignment. No evidence for acute fracture or dislocation. No significant osseous degenerative change. Visualized right hemi thorax is unremarkable.  IMPRESSION: No acute osseous abnormality.   Electronically Signed   By: Annia Belt M.D.   On: 01/25/2014 13:05     EKG Interpretation None      MDM   Final diagnoses:  None    1. Right shoulder pain  DDx: shoulder strain vs cervical radiculopathy. Suspect mild shoulder strain without neurologic symptoms of tingling, numbness or weakness in right UE.     Arnoldo Hooker, PA-C 01/25/14 1317

## 2014-01-25 NOTE — Discharge Instructions (Signed)

## 2014-01-25 NOTE — ED Provider Notes (Signed)
Medical screening examination/treatment/procedure(s) were performed by non-physician practitioner and as supervising physician I was immediately available for consultation/collaboration.  Gal Feldhaus T Latrish Mogel, MD 01/25/14 1530 

## 2014-02-04 ENCOUNTER — Inpatient Hospital Stay (HOSPITAL_BASED_OUTPATIENT_CLINIC_OR_DEPARTMENT_OTHER)
Admission: EM | Admit: 2014-02-04 | Discharge: 2014-02-09 | DRG: 098 | Disposition: A | Discharge status: Home Health | Payer: BC Managed Care – PPO | Attending: Internal Medicine | Admitting: Internal Medicine

## 2014-02-04 ENCOUNTER — Emergency Department (HOSPITAL_BASED_OUTPATIENT_CLINIC_OR_DEPARTMENT_OTHER): Payer: BC Managed Care – PPO

## 2014-02-04 ENCOUNTER — Emergency Department (HOSPITAL_COMMUNITY): Payer: BC Managed Care – PPO

## 2014-02-04 ENCOUNTER — Encounter (HOSPITAL_BASED_OUTPATIENT_CLINIC_OR_DEPARTMENT_OTHER): Payer: Self-pay | Admitting: Emergency Medicine

## 2014-02-04 DIAGNOSIS — R531 Weakness: Secondary | ICD-10-CM | POA: Diagnosis not present

## 2014-02-04 DIAGNOSIS — E876 Hypokalemia: Secondary | ICD-10-CM | POA: Diagnosis present

## 2014-02-04 DIAGNOSIS — Z8673 Personal history of transient ischemic attack (TIA), and cerebral infarction without residual deficits: Secondary | ICD-10-CM

## 2014-02-04 DIAGNOSIS — G0489 Other myelitis: Principal | ICD-10-CM | POA: Diagnosis present

## 2014-02-04 DIAGNOSIS — R29898 Other symptoms and signs involving the musculoskeletal system: Secondary | ICD-10-CM | POA: Diagnosis present

## 2014-02-04 DIAGNOSIS — R7309 Other abnormal glucose: Secondary | ICD-10-CM | POA: Diagnosis present

## 2014-02-04 DIAGNOSIS — G8191 Hemiplegia, unspecified affecting right dominant side: Secondary | ICD-10-CM | POA: Diagnosis present

## 2014-02-04 LAB — CBC WITH DIFFERENTIAL/PLATELET
BASOS ABS: 0 10*3/uL (ref 0.0–0.1)
BASOS PCT: 0 % (ref 0–1)
EOS ABS: 0 10*3/uL (ref 0.0–0.7)
EOS PCT: 1 % (ref 0–5)
HEMATOCRIT: 33.4 % — AB (ref 36.0–46.0)
Hemoglobin: 11.3 g/dL — ABNORMAL LOW (ref 12.0–15.0)
LYMPHS PCT: 34 % (ref 12–46)
Lymphs Abs: 1.7 10*3/uL (ref 0.7–4.0)
MCH: 30.5 pg (ref 26.0–34.0)
MCHC: 33.8 g/dL (ref 30.0–36.0)
MCV: 90 fL (ref 78.0–100.0)
MONO ABS: 0.6 10*3/uL (ref 0.1–1.0)
Monocytes Relative: 12 % (ref 3–12)
Neutro Abs: 2.5 10*3/uL (ref 1.7–7.7)
Neutrophils Relative %: 53 % (ref 43–77)
Platelets: 280 10*3/uL (ref 150–400)
RBC: 3.71 MIL/uL — ABNORMAL LOW (ref 3.87–5.11)
RDW: 13 % (ref 11.5–15.5)
WBC: 4.8 10*3/uL (ref 4.0–10.5)

## 2014-02-04 LAB — BASIC METABOLIC PANEL
ANION GAP: 15 (ref 5–15)
BUN: 10 mg/dL (ref 6–23)
CALCIUM: 9.2 mg/dL (ref 8.4–10.5)
CO2: 25 mEq/L (ref 19–32)
CREATININE: 0.4 mg/dL — AB (ref 0.50–1.10)
Chloride: 100 mEq/L (ref 96–112)
GFR calc Af Amer: >90 mL/min (ref >90)
Glucose, Bld: 119 mg/dL — ABNORMAL HIGH (ref 70–99)
Potassium: 3.1 mEq/L — ABNORMAL LOW (ref 3.7–5.3)
Sodium: 140 mEq/L (ref 137–147)

## 2014-02-04 NOTE — ED Notes (Signed)
[facility] arrived from [facility].  Family at bedside.  Pt reports R arm pain is the same.  Waiting for MRI.  Denies any other complaints at this time.  Warm blanket given to pt.

## 2014-02-04 NOTE — ED Notes (Addendum)
Pain in her right shoulder down to her hand x 2 weeks. Her right leg has had decreased sensation for the past 4 days. She was here last week and seen for same arm pain.

## 2014-02-04 NOTE — ED Notes (Signed)
Going [facility] Ed by [facility] per Dr. Criss Alvine

## 2014-02-04 NOTE — ED Notes (Signed)
Patient transported to MRI 

## 2014-02-04 NOTE — ED Provider Notes (Signed)
Care transferred from Dr. Criss Alvine who transported the pt to [facility] for MRI of head, neck due to about 2 weeks of R and R leg weakness. She denies numbness. She denies back pain, neck pain, ha/, bowel or bladder incontinence.  If no acute CVA or cord compression, I feel she is safe for outpt neurology f/u.    Toy Cookey, MD 02/05/14 [phone]

## 2014-02-04 NOTE — ED Provider Notes (Signed)
CSN: [id number]     Arrival date & time 02/04/14  1726 History  This chart was scribed for Audree Camel, MD by Milly Jakob, ED Scribe. The patient was seen in room MH06/MH06. Patient's care was started at 6:04 PM.   Chief Complaint  Patient presents with  . Arm Pain   The history is provided by the patient. No language interpreter was used.  HPI Comments: Taylor Wood is a 63 y.o. female who presents to the Emergency Department complaining of constant, sharp, aching pain in her right shoulder shoulder onset two weeks ago. She additionally reports intermittent pain in her right wrist and hand which began more recently. She describes the hand pain as feeling like it is "asleep." She has noticed weakness in right leg over past couple days as well. No numbness. She also reports weakness in her right leg. She states that she took Ibuprofen with no relief. She was seen here for the same right arm pain last week. She denies any injury. She denies similar symptoms on her left side. She denies back pain, neck pain, pain in her buttocks, headache, and bowel or bladder incontinence. No saddle anesthesia. She denies a history of medical problems.    Past Medical History  Diagnosis Date  . Seizure   . HA (headache)   . Viral encephalitis   . Viral meningitis    Past Surgical History  Procedure Laterality Date  . Unremarkable.     Family History  Problem Relation Age of Onset  . Hypertension Mother   . Heart disease Mother   . Diabetes Mother   . Hypertension Father   . Alzheimer's disease Father   . Diabetes Maternal Grandmother   . Hypertension Maternal Aunt   . Diabetes Maternal Aunt   . Hypertension Sister   . Healthy Brother     x3  . Healthy Daughter     x4  . Seizures Daughter   . Healthy Son     x1   History  Substance Use Topics  . Smoking status: Never Smoker   . Smokeless tobacco: Never Used  . Alcohol Use: No   OB History   Grav Para Term Preterm Abortions TAB SAB  Ect Mult Living                 Review of Systems  Constitutional: Negative for fever.  Musculoskeletal: Positive for arthralgias (right shoulder and right wrist). Negative for neck pain.  Neurological: Positive for weakness (right leg) and numbness (right hand). Negative for headaches.  All other systems reviewed and are negative.  Allergies  Review of patient's allergies indicates no known allergies.  Home Medications   Prior to Admission medications   Medication Sig Start Date End Date Taking? Authorizing Provider  cyclobenzaprine (FLEXERIL) 10 MG tablet Take 1 tablet (10 mg total) by mouth 2 (two) times daily as needed for muscle spasms. 01/25/14   Shari A Upstill, PA-C  ibuprofen (ADVIL,MOTRIN) 800 MG tablet Take 1 tablet (800 mg total) by mouth 3 (three) times daily. 01/25/14   Arnoldo Hooker, PA-C   Triage Vitals: BP 165/77  Pulse 92  Temp(Src) 99.2 F (37.3 C) (Oral)  Resp 18  Ht 5' (1.524 m)  Wt 105 lb (47.628 kg)  BMI 20.51 kg/m2  SpO2 98% Physical Exam  Nursing note and vitals reviewed. Constitutional: She is oriented to person, place, and time. She appears well-developed and well-nourished. No distress.  HENT:  Head: Normocephalic and atraumatic.  Eyes: Conjunctivae and EOM are normal. Pupils are equal, round, and reactive to light.  Neck: Neck supple. No tracheal deviation present.  Cardiovascular: Normal rate, regular rhythm and normal heart sounds.   Pulmonary/Chest: Effort normal and breath sounds normal. No respiratory distress.  Abdominal: She exhibits no distension. There is no tenderness.  Musculoskeletal: Normal range of motion.  Neurological: She is alert and oriented to person, place, and time.  CN 2-12 grossly intact. RUE and RLE significantly weak compared to LUE and LLE. Normal reflex testing. Normal gross sensation  Skin: Skin is warm and dry.  Psychiatric: She has a normal mood and affect. Her behavior is normal.    ED Course  Procedures  (including critical care time) DIAGNOSTIC STUDIES: Oxygen Saturation is 98% on room air, normal by my interpretation.    COORDINATION OF CARE: 6:14 PM-Discussed treatment plan which includes head and neck CT-scan, and possible MRI with pt at bedside and pt agreed to plan.   Labs Review Labs Reviewed  CBC WITH DIFFERENTIAL - Abnormal; Notable for the following:    RBC 3.71 (*)    Hemoglobin 11.3 (*)    HCT 33.4 (*)    All other components within normal limits  BASIC METABOLIC PANEL - Abnormal; Notable for the following:    Potassium 3.1 (*)    Glucose, Bld 119 (*)    Creatinine, Ser 0.40 (*)    All other components within normal limits    Imaging Review Ct Head Wo Contrast  02/04/2014   CLINICAL DATA:  Right-sided weakness.  EXAM: CT HEAD WITHOUT CONTRAST  CT CERVICAL SPINE WITHOUT CONTRAST  TECHNIQUE: Multidetector CT imaging of the head and cervical spine was performed following the standard protocol without intravenous contrast. Multiplanar CT image reconstructions of the cervical spine were also generated.  COMPARISON:  CT scan of head of January 18, 2013.  FINDINGS: CT HEAD FINDINGS  Bony calvarium appears intact. No mass effect or midline shift is noted. Ventricular size is within normal limits. There is no evidence of mass lesion, hemorrhage or acute infarction.  CT CERVICAL SPINE FINDINGS  Mild levoscoliosis is noted. No fracture or spondylolisthesis is noted. Moderate degenerative disc disease is noted at C3-4, C4-5 and C5-6 with anterior osteophyte formation. Mild degenerative changes are seen involving the posterior facet joints.  IMPRESSION: Moderate multilevel degenerative disc disease is noted in the cervical spine. No acute abnormality seen in the cervical spine.  Normal head CT.   Electronically Signed   By: Roque Lias M.D.   On: 02/04/2014 18:49   Ct Cervical Spine Wo Contrast  02/04/2014   CLINICAL DATA:  Right-sided weakness.  EXAM: CT HEAD WITHOUT CONTRAST  CT  CERVICAL SPINE WITHOUT CONTRAST  TECHNIQUE: Multidetector CT imaging of the head and cervical spine was performed following the standard protocol without intravenous contrast. Multiplanar CT image reconstructions of the cervical spine were also generated.  COMPARISON:  CT scan of head of January 18, 2013.  FINDINGS: CT HEAD FINDINGS  Bony calvarium appears intact. No mass effect or midline shift is noted. Ventricular size is within normal limits. There is no evidence of mass lesion, hemorrhage or acute infarction.  CT CERVICAL SPINE FINDINGS  Mild levoscoliosis is noted. No fracture or spondylolisthesis is noted. Moderate degenerative disc disease is noted at C3-4, C4-5 and C5-6 with anterior osteophyte formation. Mild degenerative changes are seen involving the posterior facet joints.  IMPRESSION: Moderate multilevel degenerative disc disease is noted in the cervical spine. No acute abnormality  seen in the cervical spine.  Normal head CT.   Electronically Signed   By: Roque LiasJames  Green M.D.   On: 02/04/2014 18:49     EKG Interpretation None      MDM   Final diagnoses:  Right sided weakness    Patient with atraumatic right trapezius pain and now right upper and lower extremity weakness. Cranial nerves are intact. No trauma noted. No bowel or bladder incontinence or fevers. At this point given her acute weakness there is concern for cervical spinal cord compression. Initially I suspected cervical radiculopathy but since her symptoms are also involving her leg I feel she needs emergent MRI to rule out spinal cord disease. Discussed with the ED physician, Dr. Micheline Mazeocherty, at [facility] who [facility] the patient in transfer. Patient at this time is stable and has a friend available to drive her and [facility].  I personally performed the services described in this documentation, which was scribed in my presence. The recorded information has been reviewed and is accurate.     Audree CamelScott T Quention Mcneill, MD 02/04/14 [phone]

## 2014-02-04 NOTE — ED Notes (Signed)
Bed: [id number] Expected date:  Expected time:  Means of arrival:  [facility]

## 2014-02-04 NOTE — Discharge Instructions (Signed)
[facility] and check [facility]. You will need an MRI to rule out a spinal cord issue causing your weakness

## 2014-02-04 NOTE — ED Notes (Signed)
Pt updated on status, MRI to take patient around 2300.  She verbalized frustration and impatience with waiting.  Encouraged her to stay and complete ordered testing.

## 2014-02-05 ENCOUNTER — Encounter (HOSPITAL_COMMUNITY): Payer: Self-pay | Admitting: General Practice

## 2014-02-05 ENCOUNTER — Inpatient Hospital Stay (HOSPITAL_COMMUNITY): Payer: BC Managed Care – PPO

## 2014-02-05 DIAGNOSIS — M6281 Muscle weakness (generalized): Secondary | ICD-10-CM

## 2014-02-05 DIAGNOSIS — R531 Weakness: Secondary | ICD-10-CM | POA: Diagnosis present

## 2014-02-05 DIAGNOSIS — E876 Hypokalemia: Secondary | ICD-10-CM | POA: Diagnosis present

## 2014-02-05 DIAGNOSIS — M6289 Other specified disorders of muscle: Secondary | ICD-10-CM | POA: Diagnosis not present

## 2014-02-05 DIAGNOSIS — G8191 Hemiplegia, unspecified affecting right dominant side: Secondary | ICD-10-CM | POA: Diagnosis present

## 2014-02-05 DIAGNOSIS — R7309 Other abnormal glucose: Secondary | ICD-10-CM | POA: Diagnosis present

## 2014-02-05 DIAGNOSIS — G0489 Other myelitis: Secondary | ICD-10-CM | POA: Diagnosis present

## 2014-02-05 DIAGNOSIS — G8190 Hemiplegia, unspecified affecting unspecified side: Secondary | ICD-10-CM | POA: Diagnosis not present

## 2014-02-05 DIAGNOSIS — Z8673 Personal history of transient ischemic attack (TIA), and cerebral infarction without residual deficits: Secondary | ICD-10-CM | POA: Diagnosis not present

## 2014-02-05 DIAGNOSIS — R29898 Other symptoms and signs involving the musculoskeletal system: Secondary | ICD-10-CM | POA: Diagnosis present

## 2014-02-05 LAB — RAPID URINE DRUG SCREEN, HOSP PERFORMED
Amphetamines: NOT DETECTED
Barbiturates: NOT DETECTED
Benzodiazepines: NOT DETECTED
COCAINE: NOT DETECTED
OPIATES: NOT DETECTED
Tetrahydrocannabinol: NOT DETECTED

## 2014-02-05 LAB — LIPID PANEL
Cholesterol: 228 mg/dL — ABNORMAL HIGH (ref 0–200)
HDL: 78 mg/dL (ref >39)
LDL CALC: 143 mg/dL — AB (ref 0–99)
Total CHOL/HDL Ratio: 2.9 RATIO
Triglycerides: 35 mg/dL (ref <150)
VLDL: 7 mg/dL (ref 0–40)

## 2014-02-05 LAB — SEDIMENTATION RATE: SED RATE: 54 mm/h — AB (ref 0–22)

## 2014-02-05 LAB — HEMOGLOBIN A1C
Hgb A1c MFr Bld: 6.3 % — ABNORMAL HIGH (ref <5.7)
MEAN PLASMA GLUCOSE: 134 mg/dL — AB (ref <117)

## 2014-02-05 MED ORDER — DEXAMETHASONE SODIUM PHOSPHATE 4 MG/ML IJ SOLN
4.0000 mg | Freq: Four times a day (QID) | INTRAMUSCULAR | Status: DC
  Administered 2014-02-05: 4 mg via INTRAVENOUS
  Filled 2014-02-05: qty 1

## 2014-02-05 MED ORDER — DEXAMETHASONE SODIUM PHOSPHATE 10 MG/ML IJ SOLN
10.0000 mg | Freq: Once | INTRAMUSCULAR | Status: AC
  Administered 2014-02-05: 10 mg via INTRAVENOUS
  Filled 2014-02-05: qty 1

## 2014-02-05 MED ORDER — HEPARIN SODIUM (PORCINE) 5000 UNIT/ML IJ SOLN
5000.0000 [IU] | Freq: Three times a day (TID) | INTRAMUSCULAR | Status: DC
  Administered 2014-02-05: 5000 [IU] via SUBCUTANEOUS
  Filled 2014-02-05 (×5): qty 1

## 2014-02-05 MED ORDER — SODIUM CHLORIDE 0.9 % IV SOLN
500.0000 mg | Freq: Two times a day (BID) | INTRAVENOUS | Status: DC
  Administered 2014-02-06 – 2014-02-09 (×8): 500 mg via INTRAVENOUS
  Filled 2014-02-05 (×9): qty 4

## 2014-02-05 MED ORDER — HEPARIN SODIUM (PORCINE) 5000 UNIT/ML IJ SOLN
5000.0000 [IU] | Freq: Once | INTRAMUSCULAR | Status: AC
  Administered 2014-02-05: 5000 [IU] via SUBCUTANEOUS
  Filled 2014-02-05: qty 1

## 2014-02-05 MED ORDER — PANTOPRAZOLE SODIUM 40 MG PO TBEC
40.0000 mg | DELAYED_RELEASE_TABLET | Freq: Every day | ORAL | Status: DC
Start: 2014-02-05 — End: 2014-02-09
  Administered 2014-02-05 – 2014-02-09 (×4): 40 mg via ORAL
  Filled 2014-02-05 (×4): qty 1

## 2014-02-05 MED ORDER — ACETAMINOPHEN 325 MG PO TABS
650.0000 mg | ORAL_TABLET | Freq: Four times a day (QID) | ORAL | Status: DC | PRN
  Administered 2014-02-05 – 2014-02-06 (×2): 650 mg via ORAL
  Filled 2014-02-05 (×2): qty 2

## 2014-02-05 MED ORDER — HEPARIN SODIUM (PORCINE) 5000 UNIT/ML IJ SOLN
5000.0000 [IU] | Freq: Three times a day (TID) | INTRAMUSCULAR | Status: DC
  Administered 2014-02-06 – 2014-02-09 (×9): 5000 [IU] via SUBCUTANEOUS
  Filled 2014-02-05 (×9): qty 1

## 2014-02-05 MED ORDER — GADOBENATE DIMEGLUMINE 529 MG/ML IV SOLN
10.0000 mL | Freq: Once | INTRAVENOUS | Status: AC | PRN
  Administered 2014-02-05: 10 mL via INTRAVENOUS

## 2014-02-05 NOTE — Evaluation (Signed)
Physical Therapy Evaluation Patient Details Name: Taylor Wood MRN: [id number] DOB: 21-Dec-1950 Today's Date: 02/05/2014   History of Present Illness  63 y.o. admitted with right arm weakness and leg weakness for 2 weeks with abnormal MRI of C spine.MRI revealed RIGHT putaminal lacunar infarct.   Clinical Impression  Pt adm due to above. Pt presents with deficits indciated below (see PT problem list), limiting her independence with functional mobility. PTA pt was independent with all ADLs and mobility. Pt to benefit from skilled acute PT to address deficits indicated below and maximize functional mobility prior to D/C to next venue. Believe pt to be a great candidate for CIR to maximize mobility and ADLs prior to returning home with family. Pt is a fall risk due to Rt LE and UE weakness as well as some cognitive deficits.     Follow Up Recommendations CIR    Equipment Recommendations  Rolling walker with 5" wheels;3in1 (PT)    Recommendations for Other Services Rehab consult     Precautions / Restrictions Precautions Precautions: Fall Restrictions Weight Bearing Restrictions: No      Mobility  Bed Mobility Overal bed mobility: Needs Assistance Bed Mobility: Supine to Sit;Sit to Supine     Supine to sit: HOB elevated;Supervision Sit to supine: HOB elevated;Supervision   General bed mobility comments: pt with difficulty due to Rt LE and UE weakness; requires incr time but able to perfom with cues for technique and safety; pt getting Rt LE tangled in blankets throughout mobility  Transfers Overall transfer level: Needs assistance Equipment used: Rolling walker (2 wheeled) Transfers: Sit to/from Stand Sit to Stand: Min assist         General transfer comment: (A) to balance; pt with LOB posteriorl and to Rt with inital sit to stand; cues for safety throughout transfers  Ambulation/Gait Ambulation/Gait assistance: Min assist Ambulation Distance (Feet): 80 Feet Assistive  device: Rolling walker (2 wheeled) Gait Pattern/deviations: Decreased dorsiflexion - right;Decreased stance time - left;Decreased step length - left;Shuffle;Decreased weight shift to right;Staggering right;Ataxic (Rt LE externally rotated) Gait velocity: cues for safe gt speed ; pt impulsive at times    General Gait Details: pt required min (A) to manage RW and maintain balance; pt with foot drop on Rt LE and overcompensates with steppage gt to clear Rt LE; Rt LE also tends to externally rotate Rt LE; may benefit from AFO  Stairs            Wheelchair Mobility    Modified Rankin (Stroke Patients Only) Modified Rankin (Stroke Patients Only) Pre-Morbid Rankin Score: No symptoms Modified Rankin: Moderately severe disability     Balance Overall balance assessment: Needs assistance Sitting-balance support: Feet supported;No upper extremity supported Sitting balance-Leahy Scale: Fair Sitting balance - Comments: denies any dizziness   Standing balance support: During functional activity;Bilateral upper extremity supported Standing balance-Leahy Scale: Poor Standing balance comment: relies on RW for support and had posterior LOB with initial standing              High level balance activites: Direction changes;Head turns;Sudden stops;Turns High Level Balance Comments: pt requires (A) to balance and mange RW             Pertinent Vitals/Pain Pain Assessment: No/denies pain    Home Living Family/patient expects to be discharged to:: Private residence Living Arrangements: Children Available Help at Discharge: Family Type of Home: House Home Access: Stairs to enter Entrance Stairs-Rails: None Entrance Stairs-Number of Steps: 4 Home Layout: One level Home Equipment:  None      Prior Function Level of Independence: Independent               Hand Dominance   Dominant Hand: Right    Extremity/Trunk Assessment   Upper Extremity Assessment: Defer to OT  evaluation (noted Rt UE weakness and decr coordination ) RUE Deficits / Details: shoulder 3-/5; unable to hold Rt shoulder flexion after placed in position    RUE Sensation: decreased light touch (on dorsal side of arm)     Lower Extremity Assessment: RLE deficits/detail RLE Deficits / Details: Rt hip 3/5;  quad 3/5; DF/PF 3-/5    Cervical / Trunk Assessment: Normal  Communication   Communication: No difficulties  Cognition Arousal/Alertness: Awake/alert Behavior During Therapy: Impulsive Overall Cognitive Status: Impaired/Different from baseline Area of Impairment: Safety/judgement         Safety/Judgement: Decreased awareness of safety;Decreased awareness of deficits     General Comments: deflects deficits throughout session    General Comments General comments (skin integrity, edema, etc.): discussed D/C recommendations with pt and daughter     Exercises Other Exercises Other Exercises: gave pt ball and theraputty and educated on exercises to be performing with RUE with emphasis on extension of digits (prayer position of hands, flattening putty with right hand and using left to assist, reaching for objects and trying to extend fingers).      Assessment/Plan    PT Assessment Patient needs continued PT services  PT Diagnosis Difficulty walking;Generalized weakness;Hemiplegia dominant side   PT Problem List Decreased strength;Decreased activity tolerance;Decreased balance;Decreased mobility;Decreased knowledge of use of DME;Decreased safety awareness;Decreased knowledge of precautions;Impaired sensation  PT Treatment Interventions DME instruction;Gait training;Stair training;Functional mobility training;Therapeutic activities;Therapeutic exercise;Balance training;Neuromuscular re-education;Patient/family education   PT Goals (Current goals can be found in the Care Plan section) Acute Rehab PT Goals Patient Stated Goal: to get better and go home PT Goal Formulation: With  patient Time For Goal Achievement: 02/12/14 Potential to Achieve Goals: Good    Frequency Min 4X/week   Barriers to discharge Inaccessible home environment has 4 STE house    Co-evaluation               End of Session Equipment Utilized During Treatment: Gait belt Activity Tolerance: Patient tolerated treatment well Patient left: in bed;with call bell/phone within reach;with bed alarm set;with family/visitor present Nurse Communication: Mobility status         Time: [id number] PT Time Calculation (min): 16 min   Charges:   PT Evaluation $Initial PT Evaluation Tier I: 1 Procedure PT Treatments $Gait Training: 8-22 mins   PT G CodesDonell Sievert:          Margeret Stachnik N, [place]  [phone] 02/05/2014, 3:59 PM

## 2014-02-05 NOTE — ED Notes (Signed)
2 Unsuccessful IV attempts made.

## 2014-02-05 NOTE — H&P (Signed)
[facility] History and Physical  Buena Mckeller GPQ:[id number] DOB: 1950/11/18 DOA: 02/04/2014  Referring physician: EDP PCP: Piedad Climes, PA-C   Chief Complaint: Right arm weakness   HPI: Taylor Wood is a 63 y.o. female who presents to the ED with c/o Right arm and right leg weakness.  Symptoms began as arm pain in her right shoulder onset 2 weeks ago.  She has had intermittent pain in her right wrist and hand has been feeling more "asleep" during this time period.  Weakness has developed over the last couple of days, no numbness.  She also reports weakness in her R leg.  Ibuprofen has provided no relief.  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  MRI of the patients C spine was abnormal with a R sided area of coord signal abnormality as described below.  Of note and possibly related, I admitted the patient last year in September with what was believed to be viral encephalitis at that time with WBC and protein in CSF studies.  Her symptoms at that time were fever of 103.1, headache, and seizures.  No organism was ever identified however (HSV and arbovirus was negative) and labwork was unremarkable other than an ANA titer of 1:80 which was "an unusual titer" as Dr. Ninetta Lights would remark in an October follow up visit.  Patient has a mildly elevated temperature of 99.1 today in the ED.  Review of Systems: Systems reviewed.  As above, otherwise negative  Past Medical History  Diagnosis Date  . Seizure   . HA (headache)   . Viral encephalitis   . Viral meningitis    Past Surgical History  Procedure Laterality Date  . Unremarkable.     Social History:  reports that she has never smoked. She has never used smokeless tobacco. She reports that she does not drink alcohol or use illicit drugs.  No Known Allergies  Family History  Problem Relation Age of Onset  . Hypertension  Mother   . Heart disease Mother   . Diabetes Mother   . Hypertension Father   . Alzheimer's disease Father   . Diabetes Maternal Grandmother   . Hypertension Maternal Aunt   . Diabetes Maternal Aunt   . Hypertension Sister   . Healthy Brother     x3  . Healthy Daughter     x4  . Seizures Daughter   . Healthy Son     x1     Prior to Admission medications   Not on File   Physical Exam: Filed Vitals:   02/05/14 0107  BP: 149/79  Pulse: 80  Temp: 99.1 F (37.3 C)  Resp: 18    BP 149/79  Pulse 80  Temp(Src) 99.1 F (37.3 C) (Oral)  Resp 18  Ht 5' (1.524 m)  Wt 47.628 kg (105 lb)  BMI 20.51 kg/m2  SpO2 100%  General Appearance:    Alert, oriented, no distress, appears stated age  Head:    Normocephalic, atraumatic  Eyes:    PERRL, EOMI, sclera non-icteric        Nose:   Nares without drainage or epistaxis. Mucosa, turbinates normal  Throat:   Moist mucous membranes. Oropharynx without erythema or exudate.  Neck:   Supple. No carotid bruits.  No thyromegaly.  No lymphadenopathy.   Back:     No CVA tenderness, no spinal tenderness  Lungs:  Clear to auscultation bilaterally, without wheezes, rhonchi or rales  Chest wall:    No tenderness to palpitation  Heart:    Regular rate and rhythm without murmurs, gallops, rubs  Abdomen:     Soft, non-tender, nondistended, normal bowel sounds, no organomegaly  Genitalia:    deferred  Rectal:    deferred  Extremities:   No clubbing, cyanosis or edema.  Pulses:   2+ and symmetric all extremities  Skin:   Skin color, texture, turgor normal, no rashes or lesions  Lymph nodes:   Cervical, supraclavicular, and axillary nodes normal  Neurologic:   RUE and RLE significantly weak compared to L side.    Labs on Admission:  Basic Metabolic Panel:  Recent Labs Lab 02/04/14 1825  NA 140  K 3.1*  CL 100  CO2 25  GLUCOSE 119*  BUN 10  CREATININE 0.40*  CALCIUM 9.2   Liver Function Tests: No results found for this  basename: AST, ALT, ALKPHOS, BILITOT, PROT, ALBUMIN,  in the last 168 hours No results found for this basename: LIPASE, AMYLASE,  in the last 168 hours No results found for this basename: AMMONIA,  in the last 168 hours CBC:  Recent Labs Lab 02/04/14 1825  WBC 4.8  NEUTROABS 2.5  HGB 11.3*  HCT 33.4*  MCV 90.0  PLT 280   Cardiac Enzymes: No results found for this basename: CKTOTAL, CKMB, CKMBINDEX, TROPONINI,  in the last 168 hours  BNP (last 3 results) No results found for this basename: PROBNP,  in the last 8760 hours CBG: No results found for this basename: GLUCAP,  in the last 168 hours  Radiological Exams on Admission: Ct Head Wo Contrast  02/04/2014   CLINICAL DATA:  Right-sided weakness.  EXAM: CT HEAD WITHOUT CONTRAST  CT CERVICAL SPINE WITHOUT CONTRAST  TECHNIQUE: Multidetector CT imaging of the head and cervical spine was performed following the standard protocol without intravenous contrast. Multiplanar CT image reconstructions of the cervical spine were also generated.  COMPARISON:  CT scan of head of January 18, 2013.  FINDINGS: CT HEAD FINDINGS  Bony calvarium appears intact. No mass effect or midline shift is noted. Ventricular size is within normal limits. There is no evidence of mass lesion, hemorrhage or acute infarction.  CT CERVICAL SPINE FINDINGS  Mild levoscoliosis is noted. No fracture or spondylolisthesis is noted. Moderate degenerative disc disease is noted at C3-4, C4-5 and C5-6 with anterior osteophyte formation. Mild degenerative changes are seen involving the posterior facet joints.  IMPRESSION: Moderate multilevel degenerative disc disease is noted in the cervical spine. No acute abnormality seen in the cervical spine.  Normal head CT.   Electronically Signed   By: Roque LiasJames  Green M.D.   On: 02/04/2014 18:49   Ct Cervical Spine Wo Contrast  02/04/2014   CLINICAL DATA:  Right-sided weakness.  EXAM: CT HEAD WITHOUT CONTRAST  CT CERVICAL SPINE WITHOUT CONTRAST   TECHNIQUE: Multidetector CT imaging of the head and cervical spine was performed following the standard protocol without intravenous contrast. Multiplanar CT image reconstructions of the cervical spine were also generated.  COMPARISON:  CT scan of head of January 18, 2013.  FINDINGS: CT HEAD FINDINGS  Bony calvarium appears intact. No mass effect or midline shift is noted. Ventricular size is within normal limits. There is no evidence of mass lesion, hemorrhage or acute infarction.  CT CERVICAL SPINE FINDINGS  Mild levoscoliosis is noted. No fracture or spondylolisthesis is noted. Moderate degenerative disc disease is noted at C3-4,  C4-5 and C5-6 with anterior osteophyte formation. Mild degenerative changes are seen involving the posterior facet joints.  IMPRESSION: Moderate multilevel degenerative disc disease is noted in the cervical spine. No acute abnormality seen in the cervical spine.  Normal head CT.   Electronically Signed   By: Roque Lias M.D.   On: 02/04/2014 18:49   Mr Laqueta Jean ZO Contrast  02/05/2014   CLINICAL DATA:  RIGHT extremity weakness. History of seizure, headache, viral encephalitis and meningitis.  EXAM: MRI HEAD WITHOUT CONTRAST  MRI CERVICAL SPINE WITHOUT CONTRAST  TECHNIQUE: Multiplanar, multiecho pulse sequences of the brain and surrounding structures, and cervical spine, to include the craniocervical junction and cervicothoracic junction, were obtained without intravenous contrast. Patient did not wish to continue with postcontrast imaging.  COMPARISON:  CT of the head and cervical spine February 04, 2014  FINDINGS: MRI HEAD FINDINGS  The ventricles and sulci are normal. No suspicious parenchymal signal, mass lesions or mass of affect. Subcentimeter focus of bright T2 signal within the RIGHT putamen. A few scattered subcentimeter supratentorial white matter FLAIR T2 hyperintensities suggest sequelae of chronic small vessel ischemic disease, less than expected for age. No reduced  diffusion to suggest acute ischemia. No susceptibility artifact to suggest hemorrhage.  No abnormal extra-axial fluid collection. No abnormal calvarial bone marrow signal. Ocular globes and orbital contents are unremarkable though not tailored for evaluation. Visualized paranasal sinuses and mastoid air cells appear well-aerated. No abnormal sellar expansion. Craniocervical junction is maintained. Minimal susceptibility artifact within the scalp.  MRI CERVICAL SPINE FINDINGS  Mild motion degraded examination. The cervical spinal cord is expanded from approximate C2 through T1, measuring up to 10 mm in AP dimension, with focal T2 bright lesions within the RIGHT lateral spinal cord measuring up to 3 mm at C3 through C5. The cord signal abnormality is predominately on the RIGHT. No syrinx. Craniocervical junction maintained.  Cervical vertebral bodies appear intact and aligned with maintenance of cervical lordosis. Moderate to severe C3-4 disc height loss, at least moderate at C4-5 and C5-6. Decreased T2 signal within all cervical discs most consistent with at least moderate desiccation. Moderate acute on chronic discogenic endplate change at C3-4, moderate chronic discogenic endplate changes C4-5 and C5-6. No suspicious STIR signal abnormality to suggest fracture.  Level by level evaluation:  C2-3: 2 mm central disc protrusion, no canal stenosis or neural foraminal narrowing.  C3-4: 3 mm broad-based disc bulge, uncovertebral hypertrophy and moderate LEFT facet arthropathy. No mild canal stenosis. Mild to moderate LEFT neural foraminal narrowing.  C4-5: 2 mm broad-based disc bulge, uncovertebral hypertrophy. Moderate LEFT and mild RIGHT facet arthropathy. Mild canal stenosis. Mild bilateral neural foraminal narrowing.  C5-6: 3 mm broad-based disc bulge, uncovertebral hypertrophy. Mild facet arthropathy. Mild canal stenosis. Mild RIGHT neural foraminal narrowing.  C6-7: 2 mm broad-based disc bulge, uncovertebral  hypertrophy mild facet arthropathy. No canal stenosis or neural foraminal narrowing.  C7-T1: No disc bulge, canal stenosis nor neural foraminal narrowing.  IMPRESSION: MRI of the head: No acute intracranial process. Contrast enhanced sequences may be more sensitive for assessment of reported meningitis.  Remote RIGHT putaminal lacunar infarct. Minimal white matter changes suggest chronic small vessel ischemic disease.  MRI of the cervical spine: Abnormal expansile signal within the spinal cord from C2 through approximately T1, predominately in the RIGHT associated with a 3 mm T2 bright cord lesion: Findings may reflect infectious or inflammatory/demyelinating process, metastasis or primary neoplasm is a consideration given the degree of edema. Recommend MRI of  the cervical spine with contrast and correlation with CSF studies.  Cervical spondylosis. Mild canal stenosis C4-5 and C5-6. Neural foraminal narrowing C3-4 through C5-6: Mild to moderate on the LEFT at C3-4.   Electronically Signed   By: Awilda Metro   On: 02/05/2014 03:26   Mr Cervical Spine W Wo Contrast  02/05/2014   CLINICAL DATA:  RIGHT extremity weakness. History of seizure, headache, viral encephalitis and meningitis.  EXAM: MRI HEAD WITHOUT CONTRAST  MRI CERVICAL SPINE WITHOUT CONTRAST  TECHNIQUE: Multiplanar, multiecho pulse sequences of the brain and surrounding structures, and cervical spine, to include the craniocervical junction and cervicothoracic junction, were obtained without intravenous contrast. Patient did not wish to continue with postcontrast imaging.  COMPARISON:  CT of the head and cervical spine February 04, 2014  FINDINGS: MRI HEAD FINDINGS  The ventricles and sulci are normal. No suspicious parenchymal signal, mass lesions or mass of affect. Subcentimeter focus of bright T2 signal within the RIGHT putamen. A few scattered subcentimeter supratentorial white matter FLAIR T2 hyperintensities suggest sequelae of chronic small  vessel ischemic disease, less than expected for age. No reduced diffusion to suggest acute ischemia. No susceptibility artifact to suggest hemorrhage.  No abnormal extra-axial fluid collection. No abnormal calvarial bone marrow signal. Ocular globes and orbital contents are unremarkable though not tailored for evaluation. Visualized paranasal sinuses and mastoid air cells appear well-aerated. No abnormal sellar expansion. Craniocervical junction is maintained. Minimal susceptibility artifact within the scalp.  MRI CERVICAL SPINE FINDINGS  Mild motion degraded examination. The cervical spinal cord is expanded from approximate C2 through T1, measuring up to 10 mm in AP dimension, with focal T2 bright lesions within the RIGHT lateral spinal cord measuring up to 3 mm at C3 through C5. The cord signal abnormality is predominately on the RIGHT. No syrinx. Craniocervical junction maintained.  Cervical vertebral bodies appear intact and aligned with maintenance of cervical lordosis. Moderate to severe C3-4 disc height loss, at least moderate at C4-5 and C5-6. Decreased T2 signal within all cervical discs most consistent with at least moderate desiccation. Moderate acute on chronic discogenic endplate change at C3-4, moderate chronic discogenic endplate changes C4-5 and C5-6. No suspicious STIR signal abnormality to suggest fracture.  Level by level evaluation:  C2-3: 2 mm central disc protrusion, no canal stenosis or neural foraminal narrowing.  C3-4: 3 mm broad-based disc bulge, uncovertebral hypertrophy and moderate LEFT facet arthropathy. No mild canal stenosis. Mild to moderate LEFT neural foraminal narrowing.  C4-5: 2 mm broad-based disc bulge, uncovertebral hypertrophy. Moderate LEFT and mild RIGHT facet arthropathy. Mild canal stenosis. Mild bilateral neural foraminal narrowing.  C5-6: 3 mm broad-based disc bulge, uncovertebral hypertrophy. Mild facet arthropathy. Mild canal stenosis. Mild RIGHT neural foraminal  narrowing.  C6-7: 2 mm broad-based disc bulge, uncovertebral hypertrophy mild facet arthropathy. No canal stenosis or neural foraminal narrowing.  C7-T1: No disc bulge, canal stenosis nor neural foraminal narrowing.  IMPRESSION: MRI of the head: No acute intracranial process. Contrast enhanced sequences may be more sensitive for assessment of reported meningitis.  Remote RIGHT putaminal lacunar infarct. Minimal white matter changes suggest chronic small vessel ischemic disease.  MRI of the cervical spine: Abnormal expansile signal within the spinal cord from C2 through approximately T1, predominately in the RIGHT associated with a 3 mm T2 bright cord lesion: Findings may reflect infectious or inflammatory/demyelinating process, metastasis or primary neoplasm is a consideration given the degree of edema. Recommend MRI of the cervical spine with contrast and correlation with CSF  studies.  Cervical spondylosis. Mild canal stenosis C4-5 and C5-6. Neural foraminal narrowing C3-4 through C5-6: Mild to moderate on the LEFT at C3-4.   Electronically Signed   By: Awilda Metro   On: 02/05/2014 03:26    EKG: Independently reviewed.  Assessment/Plan Principal Problem:   Right arm weakness   1. Right arm weakness, RLE weakness, MRI abnormality - given the HPI, at this point suspect that patient may have some form of autoimmune demyelinating disorder (? MS) effecting her CNS which has resulted in prior abnormal CSF studies, unexplained encephalitis during admit 1 year ago, and now focal neurologic findings correlating with lesions seen on MRI. 1. Neurology consulted and will see patient when she gets over to [place]. Neurology has recommended decadron given the lesion with edema in her C spine and R sided weakness, this has been dosed x1 in ED. 3. Further work up and DDx pending neurology evaluation.    Code Status: Full Code  Family Communication: daughter at bedside Disposition Plan: Admit to inpatient    Time spent: 70 min  Jlee Harkless M. [facility] Pager [phone]  If 7AM-7PM, please contact the day team taking care of the patient [email or web address] Password [email or web address] 02/05/2014, 4:16 AM

## 2014-02-05 NOTE — ED Notes (Signed)
Dr. Gardener at bedside. 

## 2014-02-05 NOTE — ED Provider Notes (Signed)
89 case d/w Dr. Leroy Kennedy admit to hospitalist at [facility] neuro will consult on arrival please give IV decadron  Edgerrin Correia K Shann Lewellyn-Rasch, MD 02/05/14 [phone]

## 2014-02-05 NOTE — Progress Notes (Signed)
Patient seen and examined Agree with Taylor Bow, DO assessment and plan  Subjective Complaining of neck pain  Physical examination  General Appearance:  Alert, oriented, no distress, appears stated age  Head:  Normocephalic, atraumatic  Eyes:  PERRL, EOMI, sclera non-icteric  Nose:  Nares without drainage or epistaxis. Mucosa, turbinates normal  Throat:  Moist mucous membranes. Oropharynx without erythema or exudate.  Neck:  Supple. No carotid bruits. No thyromegaly. No lymphadenopathy.  Back:  No CVA tenderness, no spinal tenderness  Lungs:  Clear to auscultation bilaterally, without wheezes, rhonchi or rales  Chest wall:  No tenderness to palpitation  Heart:  Regular rate and rhythm without murmurs, gallops, rubs  Abdomen:  Soft, non-tender, nondistended, normal bowel sounds, no organomegaly    Assessment and plan Right arm weakness, RLE weakness, Remote RIGHT putaminal lacunar infarct. Minimal white matter changes  MRI of the cervical spine: Findings may  reflect infectious or inflammatory/demyelinating process, metastasis  or primary neoplasm is a consideration given the degree of edema.  Recommend MRI of the cervical spine with contrast and correlation  with CSF studies. Appreciate neurology input Neurology recommended MRI of the thoracic and the cervical spine with postcontrast images  Patient received one dose of Decadron PT/OT consult 2-D echo to complete possible TIA workup Currently not on any antiplatelet therapy  Anticipate discharge home tomorrow

## 2014-02-05 NOTE — ED Notes (Signed)
Pt has returned from MRI. 

## 2014-02-05 NOTE — ED Notes (Signed)
Patient returned from MRI.

## 2014-02-05 NOTE — ED Notes (Signed)
Patient and family updated we are awaiting MRI results. Will notify family when results are made available.

## 2014-02-05 NOTE — Consult Note (Signed)
NEURO HOSPITALIST CONSULT NOTE    Reason for Consult: right arm weakness and leg weakness for 2 weeks with abnormal MRI of C spine  HPI:                                                                                                                                          Taylor Wood is an 63 y.o. female  With past history of viral meningitis one year ago. At that time she had a ANA titer of 1:80. Pateint has had no problems since then.  Two weeks ago she noted she had right shoulder pain.  She went to urgent care where xray of shoulder was normal. Shoulder pain continued and spread to the whole right arm. One week later she noted her right leg was weaker and she ad more difficulty with walking and at time keeping her balance.  At no time did she notice a decreased sensation in right leg or arm. She came to ED on this visit due to her right shoulder continued to have pain. MRI was obtained showing abnormal signal in the cervical spine and Neurology was consulted. She has received a one time dose of 10 mg Decadron while in the ED.   Back in September 2014 ANA (+) 1:80 RF <10 Anti DNA antibody was (-) SED rate 47 C reactive <0.5   Past Medical History  Diagnosis Date  . Seizure   . HA (headache)   . Viral encephalitis   . Viral meningitis     Past Surgical History  Procedure Laterality Date  . Unremarkable.      Family History  Problem Relation Age of Onset  . Hypertension Mother   . Heart disease Mother   . Diabetes Mother   . Hypertension Father   . Alzheimer's disease Father   . Diabetes Maternal Grandmother   . Hypertension Maternal Aunt   . Diabetes Maternal Aunt   . Hypertension Sister   . Healthy Brother     x3  . Healthy Daughter     x4  . Seizures Daughter   . Healthy Son     x1     Social History:  reports that she has never smoked. She has never used smokeless tobacco. She reports that she does not drink alcohol or use illicit  drugs.  No Known Allergies  MEDICATIONS:  Prior to Admission:  No prescriptions prior to admission   Scheduled: . heparin  5,000 Units Subcutaneous 3 times per day     ROS:                                                                                                                                       History obtained from the patient  General ROS: negative for - chills, fatigue, fever, night sweats, weight gain or weight loss Psychological ROS: negative for - behavioral disorder, hallucinations, memory difficulties, mood swings or suicidal ideation Ophthalmic ROS: negative for - blurry vision, double vision, eye pain or loss of vision ENT ROS: negative for - epistaxis, nasal discharge, oral lesions, sore throat, tinnitus or vertigo Allergy and Immunology ROS: negative for - hives or itchy/watery eyes Hematological and Lymphatic ROS: negative for - bleeding problems, bruising or swollen lymph nodes Endocrine ROS: negative for - galactorrhea, hair pattern changes, polydipsia/polyuria or temperature intolerance Respiratory ROS: negative for - cough, hemoptysis, shortness of breath or wheezing Cardiovascular ROS: negative for - chest pain, dyspnea on exertion, edema or irregular heartbeat Gastrointestinal ROS: negative for - abdominal pain, diarrhea, hematemesis, nausea/vomiting or stool incontinence Genito-Urinary ROS: negative for - dysuria, hematuria, incontinence or urinary frequency/urgency Musculoskeletal ROS: negative for - joint swelling or muscular weakness Neurological ROS: as noted in HPI Dermatological ROS: negative for rash and skin lesion changes   Blood pressure 161/63, pulse 75, temperature 99 F (37.2 C), temperature source Oral, resp. rate 17, height 5' (1.524 m), weight 47.628 kg (105 lb), SpO2 100.00%.   Neurologic Examination:                                                                                                       General: NAD Mental Status: Alert, oriented, thought content appropriate.  Speech fluent without evidence of aphasia.  Able to follow 3 step commands without difficulty. Cranial Nerves: II: Discs flat bilaterally; Visual fields grossly normal, pupils equal, round, reactive to light and accommodation III,IV, VI: ptosis not present, extra-ocular motions intact bilaterally V,VII: smile symmetric, facial light touch sensation normal bilaterally VIII: hearing normal bilaterally IX,X: gag reflex present XI: bilateral shoulder shrug XII: midline tongue extension without atrophy or fasciculations  Motor: Right : Upper extremity   note    Left:     Upper extremity   5/5 --5/5 shoulder, 2/5 tricep, 4/5 bicep, 3/5 finger flexion, 2/5 finger extension  Lower extremity   note    Lower  extremity   5/5 --hip flexion 4/5, knee flexion/extension/DF/PF 5/5 Tone and bulk:normal tone throughout; no atrophy noted Sensory: Pinprick and light touch intact throughout, bilaterally Deep Tendon Reflexes:  Right: Upper Extremity   Left: Upper extremity   biceps (C-5 to C-6) 3/4   biceps (C-5 to C-6) 2/4 tricep (C7) 3/4    triceps (C7) 2/4 Brachioradialis (C6) 3/4  Brachioradialis (C6) 2/4  Lower Extremity Lower Extremity  quadriceps (L-2 to L-4) 1/4   quadriceps (L-2 to L-4) 1/4 Achilles (S1) 0/4   Achilles (S1) 0/4  Plantars: Right: downgoing   Left: downgoing Cerebellar: Dysmetric right  finger-to-nose due to strength,  normal heel-to-shin test Gait: not tested.  CV: pulses palpable throughout    Lab Results: Basic Metabolic Panel:  Recent Labs Lab 02/04/14 1825  NA 140  K 3.1*  CL 100  CO2 25  GLUCOSE 119*  BUN 10  CREATININE 0.40*  CALCIUM 9.2    Liver Function Tests: No results found for this basename: AST, ALT, ALKPHOS, BILITOT, PROT, ALBUMIN,  in the last 168 hours No results found for this basename:  LIPASE, AMYLASE,  in the last 168 hours No results found for this basename: AMMONIA,  in the last 168 hours  CBC:  Recent Labs Lab 02/04/14 1825  WBC 4.8  NEUTROABS 2.5  HGB 11.3*  HCT 33.4*  MCV 90.0  PLT 280    Cardiac Enzymes: No results found for this basename: CKTOTAL, CKMB, CKMBINDEX, TROPONINI,  in the last 168 hours  Lipid Panel: No results found for this basename: CHOL, TRIG, HDL, CHOLHDL, VLDL, LDLCALC,  in the last 168 hours  CBG: No results found for this basename: GLUCAP,  in the last 168 hours  Microbiology: Results for orders placed during the hospital encounter of 01/23/13  CULTURE, BLOOD (ROUTINE X 2)     Status: None   Collection Time    01/23/13 11:15 PM      Result Value Ref Range Status   Specimen Description BLOOD LEFT ANTECUBITAL   Final   Special Requests     Final   Value: BOTTLES DRAWN AEROBIC AND ANAEROBIC AER 2.5cc ANA 2.5cc   Culture  Setup Time     Final   Value: 01/24/2013 02:12     Performed at [facility]   Culture     Final   Value: NO GROWTH 5 DAYS     Performed at [facility]   Report Status 01/30/2013 FINAL   Final  CULTURE, BLOOD (ROUTINE X 2)     Status: None   Collection Time    01/23/13 11:25 PM      Result Value Ref Range Status   Specimen Description BLOOD LEFT WRIST   Final   Special Requests BOTTLES DRAWN AEROBIC ONLY AER 4cc   Final   Culture  Setup Time     Final   Value: 01/24/2013 02:11     Performed at [facility]   Culture     Final   Value: NO GROWTH 5 DAYS     Performed at [facility]   Report Status 01/30/2013 FINAL   Final  CSF CULTURE     Status: None   Collection Time    01/23/13 11:30 PM      Result Value Ref Range Status   Specimen Description CSF   Final   Special Requests Normal   Final   Gram Stain     Final   Value: WBC PRESENT,  PREDOMINANTLY MONONUCLEAR     NO ORGANISMS SEEN     CYTOSPIN     [facility]   Culture     Final   Value:  NO GROWTH 3 DAYS     [facility]   Report Status 01/27/2013 FINAL   Final    Coagulation Studies: No results found for this basename: LABPROT, INR,  in the last 72 hours  Imaging: Ct Head Wo Contrast  02/04/2014   CLINICAL DATA:  Right-sided weakness.  EXAM: CT HEAD WITHOUT CONTRAST  CT CERVICAL SPINE WITHOUT CONTRAST  TECHNIQUE: Multidetector CT imaging of the head and cervical spine was performed following the standard protocol without intravenous contrast. Multiplanar CT image reconstructions of the cervical spine were also generated.  COMPARISON:  CT scan of head of January 18, 2013.  FINDINGS: CT HEAD FINDINGS  Bony calvarium appears intact. No mass effect or midline shift is noted. Ventricular size is within normal limits. There is no evidence of mass lesion, hemorrhage or acute infarction.  CT CERVICAL SPINE FINDINGS  Mild levoscoliosis is noted. No fracture or spondylolisthesis is noted. Moderate degenerative disc disease is noted at C3-4, C4-5 and C5-6 with anterior osteophyte formation. Mild degenerative changes are seen involving the posterior facet joints.  IMPRESSION: Moderate multilevel degenerative disc disease is noted in the cervical spine. No acute abnormality seen in the cervical spine.  Normal head CT.   Electronically Signed   By: Roque LiasJames  Green M.D.   On: 02/04/2014 18:49   Ct Cervical Spine Wo Contrast  02/04/2014   CLINICAL DATA:  Right-sided weakness.  EXAM: CT HEAD WITHOUT CONTRAST  CT CERVICAL SPINE WITHOUT CONTRAST  TECHNIQUE: Multidetector CT imaging of the head and cervical spine was performed following the standard protocol without intravenous contrast. Multiplanar CT image reconstructions of the cervical spine were also generated.  COMPARISON:  CT scan of head of January 18, 2013.  FINDINGS: CT HEAD FINDINGS  Bony calvarium appears intact. No mass effect or midline shift is noted. Ventricular size is within normal limits. There is no evidence of  mass lesion, hemorrhage or acute infarction.  CT CERVICAL SPINE FINDINGS  Mild levoscoliosis is noted. No fracture or spondylolisthesis is noted. Moderate degenerative disc disease is noted at C3-4, C4-5 and C5-6 with anterior osteophyte formation. Mild degenerative changes are seen involving the posterior facet joints.  IMPRESSION: Moderate multilevel degenerative disc disease is noted in the cervical spine. No acute abnormality seen in the cervical spine.  Normal head CT.   Electronically Signed   By: Roque LiasJames  Green M.D.   On: 02/04/2014 18:49   Mr Laqueta JeanBrain W ZOWo Contrast  02/05/2014   ADDENDUM REPORT: 02/05/2014 05:40  ADDENDUM: Partially imaged heterogeneous RIGHT thyroid lesion, predominately cystic measuring up to 19 mm for which thyroid sonogram is recommended on a nonemergent basis.   Electronically Signed   By: Awilda Metroourtnay  Bloomer   On: 02/05/2014 05:40   02/05/2014   CLINICAL DATA:  RIGHT extremity weakness. History of seizure, headache, viral encephalitis and meningitis.  EXAM: MRI HEAD WITHOUT CONTRAST  MRI CERVICAL SPINE WITHOUT CONTRAST  TECHNIQUE: Multiplanar, multiecho pulse sequences of the brain and surrounding structures, and cervical spine, to include the craniocervical junction and cervicothoracic junction, were obtained without intravenous contrast. Patient did not wish to continue with postcontrast imaging.  COMPARISON:  CT of the head and cervical spine February 04, 2014  FINDINGS: MRI HEAD FINDINGS  The ventricles and sulci are normal. No suspicious  parenchymal signal, mass lesions or mass of affect. Subcentimeter focus of bright T2 signal within the RIGHT putamen. A few scattered subcentimeter supratentorial white matter FLAIR T2 hyperintensities suggest sequelae of chronic small vessel ischemic disease, less than expected for age. No reduced diffusion to suggest acute ischemia. No susceptibility artifact to suggest hemorrhage.  No abnormal extra-axial fluid collection. No abnormal calvarial  bone marrow signal. Ocular globes and orbital contents are unremarkable though not tailored for evaluation. Visualized paranasal sinuses and mastoid air cells appear well-aerated. No abnormal sellar expansion. Craniocervical junction is maintained. Minimal susceptibility artifact within the scalp.  MRI CERVICAL SPINE FINDINGS  Mild motion degraded examination. The cervical spinal cord is expanded from approximate C2 through T1, measuring up to 10 mm in AP dimension, with focal T2 bright lesions within the RIGHT lateral spinal cord measuring up to 3 mm at C3 through C5. The cord signal abnormality is predominately on the RIGHT. No syrinx. Craniocervical junction maintained.  Cervical vertebral bodies appear intact and aligned with maintenance of cervical lordosis. Moderate to severe C3-4 disc height loss, at least moderate at C4-5 and C5-6. Decreased T2 signal within all cervical discs most consistent with at least moderate desiccation. Moderate acute on chronic discogenic endplate change at C3-4, moderate chronic discogenic endplate changes C4-5 and C5-6. No suspicious STIR signal abnormality to suggest fracture.  Level by level evaluation:  C2-3: 2 mm central disc protrusion, no canal stenosis or neural foraminal narrowing.  C3-4: 3 mm broad-based disc bulge, uncovertebral hypertrophy and moderate LEFT facet arthropathy. No mild canal stenosis. Mild to moderate LEFT neural foraminal narrowing.  C4-5: 2 mm broad-based disc bulge, uncovertebral hypertrophy. Moderate LEFT and mild RIGHT facet arthropathy. Mild canal stenosis. Mild bilateral neural foraminal narrowing.  C5-6: 3 mm broad-based disc bulge, uncovertebral hypertrophy. Mild facet arthropathy. Mild canal stenosis. Mild RIGHT neural foraminal narrowing.  C6-7: 2 mm broad-based disc bulge, uncovertebral hypertrophy mild facet arthropathy. No canal stenosis or neural foraminal narrowing.  C7-T1: No disc bulge, canal stenosis nor neural foraminal narrowing.   IMPRESSION: MRI of the head: No acute intracranial process. Contrast enhanced sequences may be more sensitive for assessment of reported meningitis.  Remote RIGHT putaminal lacunar infarct. Minimal white matter changes suggest chronic small vessel ischemic disease.  MRI of the cervical spine: Abnormal expansile signal within the spinal cord from C2 through approximately T1, predominately in the RIGHT associated with a 3 mm T2 bright cord lesion: Findings may reflect infectious or inflammatory/demyelinating process, metastasis or primary neoplasm is a consideration given the degree of edema. Recommend MRI of the cervical spine with contrast and correlation with CSF studies.  Cervical spondylosis. Mild canal stenosis C4-5 and C5-6. Neural foraminal narrowing C3-4 through C5-6: Mild to moderate on the LEFT at C3-4.  Electronically Signed: By: Awilda Metro On: 02/05/2014 03:26   Mr Cervical Spine W Wo Contrast  02/05/2014   ADDENDUM REPORT: 02/05/2014 05:40  ADDENDUM: Partially imaged heterogeneous RIGHT thyroid lesion, predominately cystic measuring up to 19 mm for which thyroid sonogram is recommended on a nonemergent basis.   Electronically Signed   By: Awilda Metro   On: 02/05/2014 05:40   02/05/2014   CLINICAL DATA:  RIGHT extremity weakness. History of seizure, headache, viral encephalitis and meningitis.  EXAM: MRI HEAD WITHOUT CONTRAST  MRI CERVICAL SPINE WITHOUT CONTRAST  TECHNIQUE: Multiplanar, multiecho pulse sequences of the brain and surrounding structures, and cervical spine, to include the craniocervical junction and cervicothoracic junction, were obtained without intravenous contrast. Patient  did not wish to continue with postcontrast imaging.  COMPARISON:  CT of the head and cervical spine February 04, 2014  FINDINGS: MRI HEAD FINDINGS  The ventricles and sulci are normal. No suspicious parenchymal signal, mass lesions or mass of affect. Subcentimeter focus of bright T2 signal within the  RIGHT putamen. A few scattered subcentimeter supratentorial white matter FLAIR T2 hyperintensities suggest sequelae of chronic small vessel ischemic disease, less than expected for age. No reduced diffusion to suggest acute ischemia. No susceptibility artifact to suggest hemorrhage.  No abnormal extra-axial fluid collection. No abnormal calvarial bone marrow signal. Ocular globes and orbital contents are unremarkable though not tailored for evaluation. Visualized paranasal sinuses and mastoid air cells appear well-aerated. No abnormal sellar expansion. Craniocervical junction is maintained. Minimal susceptibility artifact within the scalp.  MRI CERVICAL SPINE FINDINGS  Mild motion degraded examination. The cervical spinal cord is expanded from approximate C2 through T1, measuring up to 10 mm in AP dimension, with focal T2 bright lesions within the RIGHT lateral spinal cord measuring up to 3 mm at C3 through C5. The cord signal abnormality is predominately on the RIGHT. No syrinx. Craniocervical junction maintained.  Cervical vertebral bodies appear intact and aligned with maintenance of cervical lordosis. Moderate to severe C3-4 disc height loss, at least moderate at C4-5 and C5-6. Decreased T2 signal within all cervical discs most consistent with at least moderate desiccation. Moderate acute on chronic discogenic endplate change at C3-4, moderate chronic discogenic endplate changes C4-5 and C5-6. No suspicious STIR signal abnormality to suggest fracture.  Level by level evaluation:  C2-3: 2 mm central disc protrusion, no canal stenosis or neural foraminal narrowing.  C3-4: 3 mm broad-based disc bulge, uncovertebral hypertrophy and moderate LEFT facet arthropathy. No mild canal stenosis. Mild to moderate LEFT neural foraminal narrowing.  C4-5: 2 mm broad-based disc bulge, uncovertebral hypertrophy. Moderate LEFT and mild RIGHT facet arthropathy. Mild canal stenosis. Mild bilateral neural foraminal narrowing.  C5-6:  3 mm broad-based disc bulge, uncovertebral hypertrophy. Mild facet arthropathy. Mild canal stenosis. Mild RIGHT neural foraminal narrowing.  C6-7: 2 mm broad-based disc bulge, uncovertebral hypertrophy mild facet arthropathy. No canal stenosis or neural foraminal narrowing.  C7-T1: No disc bulge, canal stenosis nor neural foraminal narrowing.  IMPRESSION: MRI of the head: No acute intracranial process. Contrast enhanced sequences may be more sensitive for assessment of reported meningitis.  Remote RIGHT putaminal lacunar infarct. Minimal white matter changes suggest chronic small vessel ischemic disease.  MRI of the cervical spine: Abnormal expansile signal within the spinal cord from C2 through approximately T1, predominately in the RIGHT associated with a 3 mm T2 bright cord lesion: Findings may reflect infectious or inflammatory/demyelinating process, metastasis or primary neoplasm is a consideration given the degree of edema. Recommend MRI of the cervical spine with contrast and correlation with CSF studies.  Cervical spondylosis. Mild canal stenosis C4-5 and C5-6. Neural foraminal narrowing C3-4 through C5-6: Mild to moderate on the LEFT at C3-4.  Electronically Signed: By: Awilda Metro On: 02/05/2014 03:26       Assessment and plan per attending neurologist  Felicie Morn PA-C [facility] [phone]  02/05/2014, 10:20 AM   Assessment/Plan: 63 year old female with progressive right arm weakness with MRI findings concerning for myelitis of unclear etiology. Differential diagnosis includes multiple sclerosis, and neuromyelitis optica, infectious myelitis, autoimmune disease, metabolic myelopathy.   1) NMO IgG from serum 2) autoimmune evaluation with ANA, serum ACE, SSA, SSB, rf 3) CSF evaluation for cell count and differential,  protein, glucose, oligoclonal bands, IgG index. I would perform this after contrasted MRI  4) consider infectious evaluation with CSF PCR for HSV-1,  HSV-2, HHV-6, VZV, CMV, EBV, enteroviruses if the patient were to spike a fever, develop meningismus, or have other signs of infectious etiology clinically or on other workup 4) HIV, if immune compromised then further infectious workup 5) MRI C-spine, T-spine 6) Solumedrol 500mg  BID  Ritta Slot, MD [facility] [phone]  If 7pm- 7am, please page neurology on call as listed in AMION.

## 2014-02-05 NOTE — Progress Notes (Signed)
Pt arrived on unit via [facility]. Pt ambulated to bed with assist. Vitals stable. Pt oriented to room. Call bell within reach. Pt resting comfortably.

## 2014-02-05 NOTE — Evaluation (Signed)
Occupational Therapy Evaluation Patient Details Name: Taylor Wood MRN: [id number] DOB: 07/15/1950 Today's Date: 02/05/2014    History of Present Illness 63 y.o. admitted with right arm weakness and leg weakness for 2 weeks with abnormal MRI of C spine. MRI revealed remote infarct.   Clinical Impression   Pt s/p above. Pt independent with ADLs, PTA. Feel pt will benefit from acute OT to increase independence prior to d/c and to address RUE deficits. Recommending Outpatient OT for followup.   Follow Up Recommendations  Outpatient OT;Supervision/Assistance - 24 hour    Equipment Recommendations  Tub/shower seat    Recommendations for Other Services       Precautions / Restrictions Precautions Precautions: Fall Restrictions Weight Bearing Restrictions: No      Mobility Bed Mobility Overal bed mobility: Needs Assistance Bed Mobility: Supine to Sit;Sit to Supine     Supine to sit: Modified independent (Device/Increase time) Sit to supine: Supervision      Transfers Overall transfer level: Needs assistance   Transfers: Sit to/from Stand Sit to Stand: Min assist;Min guard              Balance                                            ADL Overall ADL's : Needs assistance/impaired     Grooming: Applying deodorant;Oral care;Minimal assistance;Standing   Upper Body Bathing: Min guard;Standing           Lower Body Dressing: Minimal assistance;Sit to/from stand   Toilet Transfer: Minimal assistance;Ambulation;RW;Comfort height toilet   Toileting- Clothing Manipulation and Hygiene: Min guard;Sit to/from stand       Functional mobility during ADLs: Minimal assistance;Rolling walker (ambulated in room.) General ADL Comments: Pt unsafe without walker when trying to take step from bed. Pt did well with walker. Encouraged pt to be using Rt hand as she can for activities. Pt with decreased safety awareness during session. Educated on safety tips  (safe shoewear, rugs, sitting for LB ADLs).  Educated on signs/symptoms of stroke and importance of getting help right away. Briefly described safe tub transfer technique but pt states she will sponge bathe.     Vision                     Perception     Praxis      Pertinent Vitals/Pain Pain Assessment:  (no c/o pain)     Hand Dominance Right   Extremity/Trunk Assessment Upper Extremity Assessment Upper Extremity Assessment: RUE deficits/detail RUE Deficits / Details: grossly 3+/5 elbow flexion, 2/5 elbow extension, 4/5 shoulder flexion, weap grasp/difficulty with finger extension. 3/5 wrist flexion/extension RUE Sensation: decreased light touch (on dorsal side of arm) RUE Coordination: decreased fine motor   Lower Extremity Assessment Lower Extremity Assessment: Defer to PT evaluation (weakness in RLE)       Communication Communication Communication: No difficulties   Cognition Arousal/Alertness: Awake/alert Behavior During Therapy: Impulsive Overall Cognitive Status:  (unsure of baseline) Area of Impairment: Safety/judgement         Safety/Judgement: Decreased awareness of safety         General Comments       Exercises Exercises: Other exercises Other Exercises Other Exercises: gave pt ball and theraputty and educated on exercises to be performing with RUE with emphasis on extension of digits (prayer position of hands, flattening putty with  right hand and using left to assist, reaching for objects and trying to extend fingers).   Shoulder Instructions      Home Living Family/patient expects to be discharged to:: Private residence Living Arrangements: Children Available Help at Discharge: Family Type of Home: House Home Access: Stairs to enter Secretary/administrator of Steps: 4 Entrance Stairs-Rails: None Home Layout: One level     Bathroom Shower/Tub: Chief Strategy Officer: Standard     Home Equipment: None           Prior Functioning/Environment Level of Independence: Independent             OT Diagnosis: Other (comment) (right hemiparesis )   OT Problem List: Decreased strength;Decreased safety awareness;Decreased knowledge of precautions;Decreased knowledge of use of DME or AE;Impaired UE functional use;Impaired sensation;Decreased coordination;Impaired balance (sitting and/or standing)   OT Treatment/Interventions: Self-care/ADL training;Therapeutic exercise;DME and/or AE instruction;Therapeutic activities;Patient/family education;Balance training    OT Goals(Current goals can be found in the care plan section) Acute Rehab OT Goals Patient Stated Goal: not stated OT Goal Formulation: With patient Time For Goal Achievement: 02/12/14 Potential to Achieve Goals: Good ADL Goals Pt Will Perform Grooming: with set-up;standing Pt Will Perform Upper Body Dressing: with set-up;sitting Pt Will Perform Lower Body Dressing: with set-up;sit to/from stand Pt Will Transfer to Toilet: with modified independence;ambulating Additional ADL Goal #1: Pt will independently perform HEP for RUE to increase strength and coordination.  OT Frequency: Min 3X/week   Barriers to D/C:            Co-evaluation              End of Session Equipment Utilized During Treatment: Gait belt;Rolling walker  Activity Tolerance: Patient tolerated treatment well Patient left: in bed;with call bell/phone within reach;with bed alarm set   Time: 1225-1258 OT Time Calculation (min): 33 min Charges:  OT General Charges $OT Visit: 1 Procedure OT Evaluation $Initial OT Evaluation Tier I: 1 Procedure OT Treatments $Therapeutic Activity: 8-22 mins G-CodesEarlie Raveling OTR/L [phone] 02/05/2014, 1:22 PM

## 2014-02-06 DIAGNOSIS — M6289 Other specified disorders of muscle: Secondary | ICD-10-CM

## 2014-02-06 DIAGNOSIS — G8190 Hemiplegia, unspecified affecting unspecified side: Secondary | ICD-10-CM

## 2014-02-06 LAB — PROTEIN AND GLUCOSE, CSF
Glucose, CSF: 88 mg/dL — ABNORMAL HIGH (ref 43–76)
TOTAL PROTEIN, CSF: 48 mg/dL — AB (ref 15–45)

## 2014-02-06 LAB — COMPREHENSIVE METABOLIC PANEL
ALBUMIN: 3.5 g/dL (ref 3.5–5.2)
ALT: 13 U/L (ref 0–35)
AST: 18 U/L (ref 0–37)
Alkaline Phosphatase: 72 U/L (ref 39–117)
Anion gap: 14 (ref 5–15)
BUN: 16 mg/dL (ref 6–23)
CO2: 26 mEq/L (ref 19–32)
Calcium: 9.6 mg/dL (ref 8.4–10.5)
Chloride: 102 mEq/L (ref 96–112)
Creatinine, Ser: 0.48 mg/dL — ABNORMAL LOW (ref 0.50–1.10)
GFR calc Af Amer: >90 mL/min (ref >90)
GFR calc non Af Amer: >90 mL/min (ref >90)
GLUCOSE: 180 mg/dL — AB (ref 70–99)
POTASSIUM: 4 meq/L (ref 3.7–5.3)
Sodium: 142 mEq/L (ref 137–147)
TOTAL PROTEIN: 7.9 g/dL (ref 6.0–8.3)
Total Bilirubin: <0.2 mg/dL — ABNORMAL LOW (ref 0.3–1.2)

## 2014-02-06 LAB — CSF CELL COUNT WITH DIFFERENTIAL
Lymphs, CSF: 90 % — ABNORMAL HIGH (ref 40–80)
Monocyte-Macrophage-Spinal Fluid: 10 % — ABNORMAL LOW (ref 15–45)
RBC Count, CSF: 1 /mm3 — ABNORMAL HIGH
TUBE #: 1
WBC CSF: 54 /mm3 — AB (ref 0–5)

## 2014-02-06 LAB — GRAM STAIN

## 2014-02-06 LAB — C-REACTIVE PROTEIN

## 2014-02-06 LAB — ANA: Anti Nuclear Antibody(ANA): POSITIVE — AB

## 2014-02-06 LAB — SJOGRENS SYNDROME-A EXTRACTABLE NUCLEAR ANTIBODY: SSA (Ro) (ENA) Antibody, IgG: >8

## 2014-02-06 LAB — ANTI-NUCLEAR AB-TITER (ANA TITER)

## 2014-02-06 LAB — ANGIOTENSIN CONVERTING ENZYME: Angiotensin-Converting Enzyme: 20 U/L (ref 8–52)

## 2014-02-06 LAB — RHEUMATOID FACTOR: Rhuematoid fact SerPl-aCnc: <10 IU/mL (ref <=14)

## 2014-02-06 LAB — SJOGRENS SYNDROME-B EXTRACTABLE NUCLEAR ANTIBODY: SSB (LA) (ENA) ANTIBODY, IGG: NEGATIVE

## 2014-02-06 LAB — HIV ANTIBODY (ROUTINE TESTING W REFLEX): HIV: NONREACTIVE

## 2014-02-06 MED ORDER — DEXTROSE 5 % IV SOLN
2.0000 g | Freq: Two times a day (BID) | INTRAVENOUS | Status: DC
  Administered 2014-02-06 – 2014-02-07 (×3): 2 g via INTRAVENOUS
  Filled 2014-02-06 (×5): qty 2

## 2014-02-06 MED ORDER — WHITE PETROLATUM GEL
Status: AC
  Administered 2014-02-06: 0.2
  Filled 2014-02-06: qty 5

## 2014-02-06 MED ORDER — VANCOMYCIN HCL IN DEXTROSE 1-5 GM/200ML-% IV SOLN
1000.0000 mg | INTRAVENOUS | Status: DC
  Administered 2014-02-06 – 2014-02-07 (×2): 1000 mg via INTRAVENOUS
  Filled 2014-02-06 (×3): qty 200

## 2014-02-06 MED ORDER — POTASSIUM CHLORIDE CRYS ER 20 MEQ PO TBCR
40.0000 meq | EXTENDED_RELEASE_TABLET | Freq: Two times a day (BID) | ORAL | Status: AC
  Administered 2014-02-06 – 2014-02-07 (×3): 40 meq via ORAL
  Filled 2014-02-06 (×3): qty 2

## 2014-02-06 MED ORDER — DEXTROSE 5 % IV SOLN
475.0000 mg | Freq: Three times a day (TID) | INTRAVENOUS | Status: DC
  Administered 2014-02-07 – 2014-02-08 (×5): 475 mg via INTRAVENOUS
  Filled 2014-02-06 (×7): qty 9.5

## 2014-02-06 NOTE — Procedures (Signed)
Indication: Transverse Myelitis  Risks of the procedure were dicussed with the patient including post-LP headache, bleeding, infection, weakness/numbness of legs(radiculopathy), death.  The patient/patient's proxy agreed and written consent was obtained.   The patient was prepped and draped, and using sterile technique a 20 gauge quinke spinal needle was inserted in the L4-5 space. The opening pressure was 8. Approximately 14 cc of CSF were obtained and sent for analysis.

## 2014-02-06 NOTE — Progress Notes (Addendum)
[facility] PROGRESS NOTE  Taylor Wood RUE:[id number] DOB: 1951/04/26 DOA: 02/04/2014 PCP: Piedad Climes, PA-C  Assessment/Plan: Principal Problem:   Right arm weakness    Assessment and plan  Right arm weakness, RLE weakness, Remote RIGHT putaminal lacunar infarct. Minimal white matter changes  MRI of the cervical spine: Findings may  reflect infectious or inflammatory/demyelinating process, metastasis  or primary neoplasm is a consideration given the degree of edema.    MRI findings concerning for myelitis of unclear etiology. Differential diagnosis includes multiple sclerosis, and neuromyelitis optica, infectious myelitis, autoimmune disease, metabolic myelopathy.  Patient to have lumbar puncture done today  Pending tests as recommended by neurology 1) NMO IgG from serum  2) autoimmune evaluation with ANA which is positive, serum ACE, SSA, SSB, rheumatoid factor negative 3) CSF evaluation for cell count and differential, protein, glucose, oligoclonal bands, IgG index. I would perform this after contrasted MRI  4) consider infectious evaluation with CSF PCR for HSV-1, HSV-2, HHV-6, VZV, CMV, EBV, enteroviruses if the patient were to spike a fever, develop meningismus, or have other signs of infectious etiology clinically or on other workup  4) HIV, if immune compromised then further infectious workup  5) MRI C-spine, T-spine  6) Solumedrol 500mg  BID  PT/OT consult -recommend CIR  2-D echo discontinued as it is not indicated at this time   Prediabetes Hemoglobin A1c 6.3  Hypokalemia Replete and recheck CMP today  Code Status: full Family Communication: family updated about patient's clinical progress Disposition Plan:  CIR consult ordered   Brief narrative: Taylor Wood is an 63 y.o. female With past history of viral meningitis one year ago. At that time she had a ANA titer of 1:80. Pateint has had no problems since then. Two weeks ago she noted she had right  shoulder pain. She went to urgent care where xray of shoulder was normal. Shoulder pain continued and spread to the whole right arm. One week later she noted her right leg was weaker and she ad more difficulty with walking and at time keeping her balance. At no time did she notice a decreased sensation in right leg or arm. She came to ED on this visit due to her right shoulder continued to have pain. MRI was obtained showing abnormal signal in the cervical spine and Neurology was consulted. She has received a one time dose of 10 mg Decadron while in the ED.    Consultants:  Neurology  Procedures:  None  Antibiotics:  None  HPI/Subjective: Feels better, strength is back, pain is improved  Objective: Filed Vitals:   02/05/14 2100 02/06/14 0100 02/06/14 0559 02/06/14 0937  BP: 140/59 131/69 123/51 144/61  Pulse: 86 87 65 75  Temp: 98.8 F (37.1 C) 98.6 F (37 C) 98.7 F (37.1 C) 98.3 F (36.8 C)  TempSrc: Oral Oral Oral Oral  Resp: 18 18 18 18   Height:      Weight:      SpO2: 100% 100% 100% 98%    Intake/Output Summary (Last 24 hours) at 02/06/14 1048 Last data filed at 02/06/14 0900  Gross per 24 hour  Intake    600 ml  Output    200 ml  Net    400 ml    Exam:  General: alert & oriented x 3 In NAD  Cardiovascular: RRR, nl S1 s2  Respiratory: Decreased breath sounds at the bases, scattered rhonchi, no crackles  Abdomen: soft +BS NT/ND, no masses palpable  Extremities: No cyanosis and no edema  Motor:  Right : Upper extremity note Left: Upper extremity 5/5  --5/5 shoulder, 2/5 tricep, 4/5 bicep, 3/5 finger flexion, 2/5 finger extension  Lower extremity note Lower extremity 5/5      Data Reviewed: Basic Metabolic Panel:  Recent Labs Lab 02/04/14 1825  NA 140  K 3.1*  CL 100  CO2 25  GLUCOSE 119*  BUN 10  CREATININE 0.40*  CALCIUM 9.2    Liver Function Tests: No results found for this basename: AST, ALT, ALKPHOS, BILITOT, PROT, ALBUMIN,  in the  last 168 hours No results found for this basename: LIPASE, AMYLASE,  in the last 168 hours No results found for this basename: AMMONIA,  in the last 168 hours  CBC:  Recent Labs Lab 02/04/14 1825  WBC 4.8  NEUTROABS 2.5  HGB 11.3*  HCT 33.4*  MCV 90.0  PLT 280    Cardiac Enzymes: No results found for this basename: CKTOTAL, CKMB, CKMBINDEX, TROPONINI,  in the last 168 hours BNP (last 3 results) No results found for this basename: PROBNP,  in the last 8760 hours   CBG: No results found for this basename: GLUCAP,  in the last 168 hours  No results found for this or any previous visit (from the past 240 hour(s)).   Studies: Dg Shoulder Right  01/25/2014   CLINICAL DATA:  Right shoulder pain.  No trauma.  EXAM: RIGHT SHOULDER - 2+ VIEW  COMPARISON:  None.  FINDINGS: Normal anatomic alignment. No evidence for acute fracture or dislocation. No significant osseous degenerative change. Visualized right hemi thorax is unremarkable.  IMPRESSION: No acute osseous abnormality.   Electronically Signed   By: Annia Belt M.D.   On: 01/25/2014 13:05   Ct Head Wo Contrast  02/04/2014   CLINICAL DATA:  Right-sided weakness.  EXAM: CT HEAD WITHOUT CONTRAST  CT CERVICAL SPINE WITHOUT CONTRAST  TECHNIQUE: Multidetector CT imaging of the head and cervical spine was performed following the standard protocol without intravenous contrast. Multiplanar CT image reconstructions of the cervical spine were also generated.  COMPARISON:  CT scan of head of January 18, 2013.  FINDINGS: CT HEAD FINDINGS  Bony calvarium appears intact. No mass effect or midline shift is noted. Ventricular size is within normal limits. There is no evidence of mass lesion, hemorrhage or acute infarction.  CT CERVICAL SPINE FINDINGS  Mild levoscoliosis is noted. No fracture or spondylolisthesis is noted. Moderate degenerative disc disease is noted at C3-4, C4-5 and C5-6 with anterior osteophyte formation. Mild degenerative changes are  seen involving the posterior facet joints.  IMPRESSION: Moderate multilevel degenerative disc disease is noted in the cervical spine. No acute abnormality seen in the cervical spine.  Normal head CT.   Electronically Signed   By: Roque Lias M.D.   On: 02/04/2014 18:49   Ct Cervical Spine Wo Contrast  02/04/2014   CLINICAL DATA:  Right-sided weakness.  EXAM: CT HEAD WITHOUT CONTRAST  CT CERVICAL SPINE WITHOUT CONTRAST  TECHNIQUE: Multidetector CT imaging of the head and cervical spine was performed following the standard protocol without intravenous contrast. Multiplanar CT image reconstructions of the cervical spine were also generated.  COMPARISON:  CT scan of head of January 18, 2013.  FINDINGS: CT HEAD FINDINGS  Bony calvarium appears intact. No mass effect or midline shift is noted. Ventricular size is within normal limits. There is no evidence of mass lesion, hemorrhage or acute infarction.  CT CERVICAL SPINE FINDINGS  Mild levoscoliosis is noted. No fracture or spondylolisthesis is  noted. Moderate degenerative disc disease is noted at C3-4, C4-5 and C5-6 with anterior osteophyte formation. Mild degenerative changes are seen involving the posterior facet joints.  IMPRESSION: Moderate multilevel degenerative disc disease is noted in the cervical spine. No acute abnormality seen in the cervical spine.  Normal head CT.   Electronically Signed   By: Roque Lias M.D.   On: 02/04/2014 18:49   Mr Laqueta Jean BJ Contrast  02/05/2014   ADDENDUM REPORT: 02/05/2014 05:40  ADDENDUM: Partially imaged heterogeneous RIGHT thyroid lesion, predominately cystic measuring up to 19 mm for which thyroid sonogram is recommended on a nonemergent basis.   Electronically Signed   By: Awilda Metro   On: 02/05/2014 05:40   02/05/2014   CLINICAL DATA:  RIGHT extremity weakness. History of seizure, headache, viral encephalitis and meningitis.  EXAM: MRI HEAD WITHOUT CONTRAST  MRI CERVICAL SPINE WITHOUT CONTRAST  TECHNIQUE:  Multiplanar, multiecho pulse sequences of the brain and surrounding structures, and cervical spine, to include the craniocervical junction and cervicothoracic junction, were obtained without intravenous contrast. Patient did not wish to continue with postcontrast imaging.  COMPARISON:  CT of the head and cervical spine February 04, 2014  FINDINGS: MRI HEAD FINDINGS  The ventricles and sulci are normal. No suspicious parenchymal signal, mass lesions or mass of affect. Subcentimeter focus of bright T2 signal within the RIGHT putamen. A few scattered subcentimeter supratentorial white matter FLAIR T2 hyperintensities suggest sequelae of chronic small vessel ischemic disease, less than expected for age. No reduced diffusion to suggest acute ischemia. No susceptibility artifact to suggest hemorrhage.  No abnormal extra-axial fluid collection. No abnormal calvarial bone marrow signal. Ocular globes and orbital contents are unremarkable though not tailored for evaluation. Visualized paranasal sinuses and mastoid air cells appear well-aerated. No abnormal sellar expansion. Craniocervical junction is maintained. Minimal susceptibility artifact within the scalp.  MRI CERVICAL SPINE FINDINGS  Mild motion degraded examination. The cervical spinal cord is expanded from approximate C2 through T1, measuring up to 10 mm in AP dimension, with focal T2 bright lesions within the RIGHT lateral spinal cord measuring up to 3 mm at C3 through C5. The cord signal abnormality is predominately on the RIGHT. No syrinx. Craniocervical junction maintained.  Cervical vertebral bodies appear intact and aligned with maintenance of cervical lordosis. Moderate to severe C3-4 disc height loss, at least moderate at C4-5 and C5-6. Decreased T2 signal within all cervical discs most consistent with at least moderate desiccation. Moderate acute on chronic discogenic endplate change at C3-4, moderate chronic discogenic endplate changes C4-5 and C5-6. No  suspicious STIR signal abnormality to suggest fracture.  Level by level evaluation:  C2-3: 2 mm central disc protrusion, no canal stenosis or neural foraminal narrowing.  C3-4: 3 mm broad-based disc bulge, uncovertebral hypertrophy and moderate LEFT facet arthropathy. No mild canal stenosis. Mild to moderate LEFT neural foraminal narrowing.  C4-5: 2 mm broad-based disc bulge, uncovertebral hypertrophy. Moderate LEFT and mild RIGHT facet arthropathy. Mild canal stenosis. Mild bilateral neural foraminal narrowing.  C5-6: 3 mm broad-based disc bulge, uncovertebral hypertrophy. Mild facet arthropathy. Mild canal stenosis. Mild RIGHT neural foraminal narrowing.  C6-7: 2 mm broad-based disc bulge, uncovertebral hypertrophy mild facet arthropathy. No canal stenosis or neural foraminal narrowing.  C7-T1: No disc bulge, canal stenosis nor neural foraminal narrowing.  IMPRESSION: MRI of the head: No acute intracranial process. Contrast enhanced sequences may be more sensitive for assessment of reported meningitis.  Remote RIGHT putaminal lacunar infarct. Minimal white matter changes suggest  chronic small vessel ischemic disease.  MRI of the cervical spine: Abnormal expansile signal within the spinal cord from C2 through approximately T1, predominately in the RIGHT associated with a 3 mm T2 bright cord lesion: Findings may reflect infectious or inflammatory/demyelinating process, metastasis or primary neoplasm is a consideration given the degree of edema. Recommend MRI of the cervical spine with contrast and correlation with CSF studies.  Cervical spondylosis. Mild canal stenosis C4-5 and C5-6. Neural foraminal narrowing C3-4 through C5-6: Mild to moderate on the LEFT at C3-4.  Electronically Signed: By: Awilda Metro On: 02/05/2014 03:26   Mr Cervical Spine W Contrast  02/05/2014   CLINICAL DATA:  Right lower extremity weakness. History viral encephalitis an meningitis. Abnormal MRI of the cervical spine.  EXAM: MRI  CERVICAL SPINE WITH CONTRAST  TECHNIQUE: Multiplanar and multiecho pulse sequences of the cervical spine, to include the craniocervical junction and cervicothoracic junction, were obtained according to standard protocol with intravenous contrast.  CONTRAST:  10mL MULTIHANCE GADOBENATE DIMEGLUMINE 529 MG/ML IV SOLN  COMPARISON:  MRI of the cervical spine without contrast 02/04/2014.  FINDINGS: Asymmetric irregular enhancement is noted within the right side of the cord from C3-4 through C6-7, extending 3.7 cm. This is less than the total extent of T2 signal abnormality which extends to the level of T1. No other focal cord enhancement is present. There is no enhancement of the osseous spine. Disc disease is again noted.  IMPRESSION: Asymmetric right-sided enhancement of the spinal cord from C3-4 through C6-7. The finding is most compatible with transverse myelitis or ADEM. There is no discrete enhancement to suggest tumor. The precontrast pattern is atypical of multiple sclerosis.   Electronically Signed   By: Gennette Pac M.D.   On: 02/05/2014 17:58   Mr Cervical Spine W Wo Contrast  02/05/2014   ADDENDUM REPORT: 02/05/2014 05:40  ADDENDUM: Partially imaged heterogeneous RIGHT thyroid lesion, predominately cystic measuring up to 19 mm for which thyroid sonogram is recommended on a nonemergent basis.   Electronically Signed   By: Awilda Metro   On: 02/05/2014 05:40   02/05/2014   CLINICAL DATA:  RIGHT extremity weakness. History of seizure, headache, viral encephalitis and meningitis.  EXAM: MRI HEAD WITHOUT CONTRAST  MRI CERVICAL SPINE WITHOUT CONTRAST  TECHNIQUE: Multiplanar, multiecho pulse sequences of the brain and surrounding structures, and cervical spine, to include the craniocervical junction and cervicothoracic junction, were obtained without intravenous contrast. Patient did not wish to continue with postcontrast imaging.  COMPARISON:  CT of the head and cervical spine February 04, 2014   FINDINGS: MRI HEAD FINDINGS  The ventricles and sulci are normal. No suspicious parenchymal signal, mass lesions or mass of affect. Subcentimeter focus of bright T2 signal within the RIGHT putamen. A few scattered subcentimeter supratentorial white matter FLAIR T2 hyperintensities suggest sequelae of chronic small vessel ischemic disease, less than expected for age. No reduced diffusion to suggest acute ischemia. No susceptibility artifact to suggest hemorrhage.  No abnormal extra-axial fluid collection. No abnormal calvarial bone marrow signal. Ocular globes and orbital contents are unremarkable though not tailored for evaluation. Visualized paranasal sinuses and mastoid air cells appear well-aerated. No abnormal sellar expansion. Craniocervical junction is maintained. Minimal susceptibility artifact within the scalp.  MRI CERVICAL SPINE FINDINGS  Mild motion degraded examination. The cervical spinal cord is expanded from approximate C2 through T1, measuring up to 10 mm in AP dimension, with focal T2 bright lesions within the RIGHT lateral spinal cord measuring up to 3 mm  at C3 through C5. The cord signal abnormality is predominately on the RIGHT. No syrinx. Craniocervical junction maintained.  Cervical vertebral bodies appear intact and aligned with maintenance of cervical lordosis. Moderate to severe C3-4 disc height loss, at least moderate at C4-5 and C5-6. Decreased T2 signal within all cervical discs most consistent with at least moderate desiccation. Moderate acute on chronic discogenic endplate change at C3-4, moderate chronic discogenic endplate changes C4-5 and C5-6. No suspicious STIR signal abnormality to suggest fracture.  Level by level evaluation:  C2-3: 2 mm central disc protrusion, no canal stenosis or neural foraminal narrowing.  C3-4: 3 mm broad-based disc bulge, uncovertebral hypertrophy and moderate LEFT facet arthropathy. No mild canal stenosis. Mild to moderate LEFT neural foraminal narrowing.   C4-5: 2 mm broad-based disc bulge, uncovertebral hypertrophy. Moderate LEFT and mild RIGHT facet arthropathy. Mild canal stenosis. Mild bilateral neural foraminal narrowing.  C5-6: 3 mm broad-based disc bulge, uncovertebral hypertrophy. Mild facet arthropathy. Mild canal stenosis. Mild RIGHT neural foraminal narrowing.  C6-7: 2 mm broad-based disc bulge, uncovertebral hypertrophy mild facet arthropathy. No canal stenosis or neural foraminal narrowing.  C7-T1: No disc bulge, canal stenosis nor neural foraminal narrowing.  IMPRESSION: MRI of the head: No acute intracranial process. Contrast enhanced sequences may be more sensitive for assessment of reported meningitis.  Remote RIGHT putaminal lacunar infarct. Minimal white matter changes suggest chronic small vessel ischemic disease.  MRI of the cervical spine: Abnormal expansile signal within the spinal cord from C2 through approximately T1, predominately in the RIGHT associated with a 3 mm T2 bright cord lesion: Findings may reflect infectious or inflammatory/demyelinating process, metastasis or primary neoplasm is a consideration given the degree of edema. Recommend MRI of the cervical spine with contrast and correlation with CSF studies.  Cervical spondylosis. Mild canal stenosis C4-5 and C5-6. Neural foraminal narrowing C3-4 through C5-6: Mild to moderate on the LEFT at C3-4.  Electronically Signed: By: Awilda Metroourtnay  Bloomer On: 02/05/2014 03:26   Mr Thoracic Spine W Wo Contrast  02/05/2014   CLINICAL DATA:  Right lower extremity weakness. History of viral encephalitis and meningitis.  EXAM: MRI THORACIC SPINE WITHOUT AND WITH CONTRAST  TECHNIQUE: Multiplanar and multiecho pulse sequences of the thoracic spine were obtained without and with intravenous contrast.  CONTRAST:  10mL MULTIHANCE GADOBENATE DIMEGLUMINE 529 MG/ML IV SOLN  COMPARISON:  MRI of the cervical spine an MRI the brain 02/04/2014.  FINDINGS: Abnormal cord signal within the cervical spine extends  to the level of T1. No additional cord signal abnormality is present through the remainder of the thoracic spine. The conus medullaris is at L1, within normal limits. Minimal endplate marrow changes are noted inferiorly at T8. Marrow signal, vertebral body heights, alignment are otherwise normal.  Shallow disc protrusions are present at T3-4, T4-5, T5-6, and T6-7. Slight disc bulging is present at T10-11. The foramina are patent bilaterally.  Postcontrast images demonstrate no pathologic enhancement within the thoracic spine.  IMPRESSION: 1. Abnormal cord signal within the cervical spine extends to the level of T1. 2. Remainder of the thoracic spine is within normal limits. 3. Minimal disc bulging at multiple levels as described. There is no focal stenosis.   Electronically Signed   By: Gennette Pachris  Mattern M.D.   On: 02/05/2014 17:54    Scheduled Meds: . heparin subcutaneous  5,000 Units Subcutaneous 3 times per day  . methylPREDNISolone (SOLU-MEDROL) injection  500 mg Intravenous Q12H  . pantoprazole  40 mg Oral Q0600   Continuous Infusions:  Principal Problem:   Right arm weakness    Time spent: 40 minutes   [facility]  [facility] Pager [phone]. If 7PM-7AM, please contact night-coverage at [email or web address], password [facility] 02/06/2014, 10:48 AM  LOS: 2 days

## 2014-02-06 NOTE — Progress Notes (Signed)
UR complete.  Dejay Kronk RN, MSN 

## 2014-02-06 NOTE — Consult Note (Signed)
Physical Medicine and Rehabilitation Consult Reason for Consult: RUE/RLE weakness Referring Physician: Dr. Susie Cassette    HPI: Taylor Wood is a 63 y.o. female with history of viral meningitis, seizures, two week history of right shoulder pain progressing to RUE pain as well as RLE weakness with difficulty walking for the past week. She was evaluated in ED for shoulder pain and admitted on 02/04/14 due to abnormal xrays. MRI brain with remote right putaminal infarct and MRI cervical/ thoracic spine with expansile signal in C2-T1 predominately in right associated with T2 bright cord signal with question infectious, inflammatory, metastases or primary neoplasm.  She was started on solumedrol 500 mg  bid and neurology following for work up. ANA positive. LP pending. PT evaluation done revealing difficulty with mobility with RLE weakness. CIR recommended for follow up therapy.    Review of Systems  Eyes: Negative for blurred vision and double vision.  Neurological: Positive for focal weakness. Negative for sensory change.     Past Medical History  Diagnosis Date  . Seizure   . HA (headache)   . Viral encephalitis   . Viral meningitis     Past Surgical History  Procedure Laterality Date  . Unremarkable.      Family History  Problem Relation Age of Onset  . Hypertension Mother   . Heart disease Mother   . Diabetes Mother   . Hypertension Father   . Alzheimer's disease Father   . Diabetes Maternal Grandmother   . Hypertension Maternal Aunt   . Diabetes Maternal Aunt   . Hypertension Sister   . Healthy Brother     x3  . Healthy Daughter     x4  . Seizures Daughter   . Healthy Son     x1    Social History:  reports that she has never smoked. She has never used smokeless tobacco. She reports that she does not drink alcohol or use illicit drugs.   Allergies: No Known Allergies  No prescriptions prior to admission    Home: Home Living Family/patient expects to be  discharged to:: Private residence Living Arrangements: Children Available Help at Discharge: Family Type of Home: House Home Access: Stairs to enter Secretary/administrator of Steps: 4 Entrance Stairs-Rails: None Home Layout: One level Home Equipment: None  Functional History: Prior Function Level of Independence: Independent Functional Status:  Mobility: Bed Mobility Overal bed mobility: Needs Assistance Bed Mobility: Supine to Sit;Sit to Supine Supine to sit: HOB elevated;Supervision Sit to supine: HOB elevated;Supervision General bed mobility comments: pt with difficulty due to Rt LE and UE weakness; requires incr time but able to perfom with cues for technique and safety; pt getting Rt LE tangled in blankets throughout mobility Transfers Overall transfer level: Needs assistance Equipment used: Rolling walker (2 wheeled) Transfers: Sit to/from Stand Sit to Stand: Min assist General transfer comment: (A) to balance; pt with LOB posteriorl and to Rt with inital sit to stand; cues for safety throughout transfers Ambulation/Gait Ambulation/Gait assistance: Min assist Ambulation Distance (Feet): 80 Feet Assistive device: Rolling walker (2 wheeled) Gait Pattern/deviations: Decreased dorsiflexion - right;Decreased stance time - left;Decreased step length - left;Shuffle;Decreased weight shift to right;Staggering right;Ataxic (Rt LE externally rotated) Gait velocity: cues for safe gt speed ; pt impulsive at times  General Gait Details: pt required min (A) to manage RW and maintain balance; pt with foot drop on Rt LE and overcompensates with steppage gt to clear Rt LE; Rt LE also tends to externally rotate  Rt LE; may benefit from AFO    ADL: ADL Overall ADL's : Needs assistance/impaired Grooming: Applying deodorant;Oral care;Minimal assistance;Standing Upper Body Bathing: Min guard;Standing Lower Body Dressing: Minimal assistance;Sit to/from stand Toilet Transfer: Minimal  assistance;Ambulation;RW;Comfort height toilet Toileting- Clothing Manipulation and Hygiene: Min guard;Sit to/from stand Functional mobility during ADLs: Minimal assistance;Rolling walker (ambulated in room.) General ADL Comments: Pt unsafe without walker when trying to take step from bed. Pt did well with walker. Encouraged pt to be using Rt hand as she can for activities. Pt with decreased safety awareness during session. Educated on safety tips (safe shoewear, rugs, sitting for LB ADLs).  Educated on signs/symptoms of stroke and importance of getting help right away. Briefly described safe tub transfer technique but pt states she will sponge bathe.  Cognition: Cognition Overall Cognitive Status: Impaired/Different from baseline Orientation Level: Oriented X4 Cognition Arousal/Alertness: Awake/alert Behavior During Therapy: Impulsive Overall Cognitive Status: Impaired/Different from baseline Area of Impairment: Safety/judgement Safety/Judgement: Decreased awareness of safety;Decreased awareness of deficits General Comments: deflects deficits throughout session  Blood pressure 144/61, pulse 75, temperature 98.3 F (36.8 C), temperature source Oral, resp. rate 18, height 5' (1.524 m), weight 47.628 kg (105 lb), SpO2 98.00%. Physical Exam  Constitutional: She appears well-developed and well-nourished.  HENT:  Head: Normocephalic and atraumatic.  Cardiovascular: Normal rate.   Respiratory: Effort normal.  GI: She exhibits no distension.  Neurological:  RUE: grossly 3- to 3+/5. RLE: 2-3/5 but inconsistent. Senses pain and light touch. LUE and LLE appear normal. Fair insight and awareness.    Results for orders placed during the hospital encounter of 02/04/14 (from the past 24 hour(s))  URINE RAPID DRUG SCREEN (HOSP PERFORMED)     Status: None   Collection Time    02/05/14  2:30 PM      Result Value Ref Range   Opiates NONE DETECTED  NONE DETECTED   Cocaine NONE DETECTED  NONE  DETECTED   Benzodiazepines NONE DETECTED  NONE DETECTED   Amphetamines NONE DETECTED  NONE DETECTED   Tetrahydrocannabinol NONE DETECTED  NONE DETECTED   Barbiturates NONE DETECTED  NONE DETECTED  ANA     Status: Abnormal   Collection Time    02/05/14  9:31 PM      Result Value Ref Range   ANA POSITIVE (*) NEGATIVE  HIV ANTIBODY (ROUTINE TESTING)     Status: None   Collection Time    02/05/14  9:31 PM      Result Value Ref Range   HIV 1&2 Ab, 4th Generation NONREACTIVE  NONREACTIVE  SEDIMENTATION RATE     Status: Abnormal   Collection Time    02/05/14  9:31 PM      Result Value Ref Range   Sed Rate 54 (*) 0 - 22 mm/hr  C-REACTIVE PROTEIN     Status: Abnormal   Collection Time    02/05/14  9:31 PM      Result Value Ref Range   CRP <0.5 (*) <0.60 mg/dL  RHEUMATOID FACTOR     Status: None   Collection Time    02/05/14  9:31 PM      Result Value Ref Range   Rheumatoid Factor <10  <=14 IU/mL   Ct Head Wo Contrast  02/04/2014   CLINICAL DATA:  Right-sided weakness.  EXAM: CT HEAD WITHOUT CONTRAST  CT CERVICAL SPINE WITHOUT CONTRAST  TECHNIQUE: Multidetector CT imaging of the head and cervical spine was performed following the standard protocol without intravenous contrast. Multiplanar CT image  reconstructions of the cervical spine were also generated.  COMPARISON:  CT scan of head of January 18, 2013.  FINDINGS: CT HEAD FINDINGS  Bony calvarium appears intact. No mass effect or midline shift is noted. Ventricular size is within normal limits. There is no evidence of mass lesion, hemorrhage or acute infarction.  CT CERVICAL SPINE FINDINGS  Mild levoscoliosis is noted. No fracture or spondylolisthesis is noted. Moderate degenerative disc disease is noted at C3-4, C4-5 and C5-6 with anterior osteophyte formation. Mild degenerative changes are seen involving the posterior facet joints.  IMPRESSION: Moderate multilevel degenerative disc disease is noted in the cervical spine. No acute  abnormality seen in the cervical spine.  Normal head CT.   Electronically Signed   By: Roque LiasJames  Green M.D.   On: 02/04/2014 18:49   Ct Cervical Spine Wo Contrast  02/04/2014   CLINICAL DATA:  Right-sided weakness.  EXAM: CT HEAD WITHOUT CONTRAST  CT CERVICAL SPINE WITHOUT CONTRAST  TECHNIQUE: Multidetector CT imaging of the head and cervical spine was performed following the standard protocol without intravenous contrast. Multiplanar CT image reconstructions of the cervical spine were also generated.  COMPARISON:  CT scan of head of January 18, 2013.  FINDINGS: CT HEAD FINDINGS  Bony calvarium appears intact. No mass effect or midline shift is noted. Ventricular size is within normal limits. There is no evidence of mass lesion, hemorrhage or acute infarction.  CT CERVICAL SPINE FINDINGS  Mild levoscoliosis is noted. No fracture or spondylolisthesis is noted. Moderate degenerative disc disease is noted at C3-4, C4-5 and C5-6 with anterior osteophyte formation. Mild degenerative changes are seen involving the posterior facet joints.  IMPRESSION: Moderate multilevel degenerative disc disease is noted in the cervical spine. No acute abnormality seen in the cervical spine.  Normal head CT.   Electronically Signed   By: Roque LiasJames  Green M.D.   On: 02/04/2014 18:49   Mr Laqueta JeanBrain W MVWo Contrast  02/05/2014   ADDENDUM REPORT: 02/05/2014 05:40  ADDENDUM: Partially imaged heterogeneous RIGHT thyroid lesion, predominately cystic measuring up to 19 mm for which thyroid sonogram is recommended on a nonemergent basis.   Electronically Signed   By: Awilda Metroourtnay  Bloomer   On: 02/05/2014 05:40   02/05/2014   CLINICAL DATA:  RIGHT extremity weakness. History of seizure, headache, viral encephalitis and meningitis.  EXAM: MRI HEAD WITHOUT CONTRAST  MRI CERVICAL SPINE WITHOUT CONTRAST  TECHNIQUE: Multiplanar, multiecho pulse sequences of the brain and surrounding structures, and cervical spine, to include the craniocervical junction and  cervicothoracic junction, were obtained without intravenous contrast. Patient did not wish to continue with postcontrast imaging.  COMPARISON:  CT of the head and cervical spine February 04, 2014  FINDINGS: MRI HEAD FINDINGS  The ventricles and sulci are normal. No suspicious parenchymal signal, mass lesions or mass of affect. Subcentimeter focus of bright T2 signal within the RIGHT putamen. A few scattered subcentimeter supratentorial white matter FLAIR T2 hyperintensities suggest sequelae of chronic small vessel ischemic disease, less than expected for age. No reduced diffusion to suggest acute ischemia. No susceptibility artifact to suggest hemorrhage.  No abnormal extra-axial fluid collection. No abnormal calvarial bone marrow signal. Ocular globes and orbital contents are unremarkable though not tailored for evaluation. Visualized paranasal sinuses and mastoid air cells appear well-aerated. No abnormal sellar expansion. Craniocervical junction is maintained. Minimal susceptibility artifact within the scalp.  MRI CERVICAL SPINE FINDINGS  Mild motion degraded examination. The cervical spinal cord is expanded from approximate C2 through T1, measuring up to  10 mm in AP dimension, with focal T2 bright lesions within the RIGHT lateral spinal cord measuring up to 3 mm at C3 through C5. The cord signal abnormality is predominately on the RIGHT. No syrinx. Craniocervical junction maintained.  Cervical vertebral bodies appear intact and aligned with maintenance of cervical lordosis. Moderate to severe C3-4 disc height loss, at least moderate at C4-5 and C5-6. Decreased T2 signal within all cervical discs most consistent with at least moderate desiccation. Moderate acute on chronic discogenic endplate change at C3-4, moderate chronic discogenic endplate changes C4-5 and C5-6. No suspicious STIR signal abnormality to suggest fracture.  Level by level evaluation:  C2-3: 2 mm central disc protrusion, no canal stenosis or  neural foraminal narrowing.  C3-4: 3 mm broad-based disc bulge, uncovertebral hypertrophy and moderate LEFT facet arthropathy. No mild canal stenosis. Mild to moderate LEFT neural foraminal narrowing.  C4-5: 2 mm broad-based disc bulge, uncovertebral hypertrophy. Moderate LEFT and mild RIGHT facet arthropathy. Mild canal stenosis. Mild bilateral neural foraminal narrowing.  C5-6: 3 mm broad-based disc bulge, uncovertebral hypertrophy. Mild facet arthropathy. Mild canal stenosis. Mild RIGHT neural foraminal narrowing.  C6-7: 2 mm broad-based disc bulge, uncovertebral hypertrophy mild facet arthropathy. No canal stenosis or neural foraminal narrowing.  C7-T1: No disc bulge, canal stenosis nor neural foraminal narrowing.  IMPRESSION: MRI of the head: No acute intracranial process. Contrast enhanced sequences may be more sensitive for assessment of reported meningitis.  Remote RIGHT putaminal lacunar infarct. Minimal white matter changes suggest chronic small vessel ischemic disease.  MRI of the cervical spine: Abnormal expansile signal within the spinal cord from C2 through approximately T1, predominately in the RIGHT associated with a 3 mm T2 bright cord lesion: Findings may reflect infectious or inflammatory/demyelinating process, metastasis or primary neoplasm is a consideration given the degree of edema. Recommend MRI of the cervical spine with contrast and correlation with CSF studies.  Cervical spondylosis. Mild canal stenosis C4-5 and C5-6. Neural foraminal narrowing C3-4 through C5-6: Mild to moderate on the LEFT at C3-4.  Electronically Signed: By: Awilda Metro On: 02/05/2014 03:26   Mr Cervical Spine W Contrast  02/05/2014   CLINICAL DATA:  Right lower extremity weakness. History viral encephalitis an meningitis. Abnormal MRI of the cervical spine.  EXAM: MRI CERVICAL SPINE WITH CONTRAST  TECHNIQUE: Multiplanar and multiecho pulse sequences of the cervical spine, to include the craniocervical junction  and cervicothoracic junction, were obtained according to standard protocol with intravenous contrast.  CONTRAST:  10mL MULTIHANCE GADOBENATE DIMEGLUMINE 529 MG/ML IV SOLN  COMPARISON:  MRI of the cervical spine without contrast 02/04/2014.  FINDINGS: Asymmetric irregular enhancement is noted within the right side of the cord from C3-4 through C6-7, extending 3.7 cm. This is less than the total extent of T2 signal abnormality which extends to the level of T1. No other focal cord enhancement is present. There is no enhancement of the osseous spine. Disc disease is again noted.  IMPRESSION: Asymmetric right-sided enhancement of the spinal cord from C3-4 through C6-7. The finding is most compatible with transverse myelitis or ADEM. There is no discrete enhancement to suggest tumor. The precontrast pattern is atypical of multiple sclerosis.   Electronically Signed   By: Gennette Pac M.D.   On: 02/05/2014 17:58   Mr Cervical Spine W Wo Contrast  02/05/2014   ADDENDUM REPORT: 02/05/2014 05:40  ADDENDUM: Partially imaged heterogeneous RIGHT thyroid lesion, predominately cystic measuring up to 19 mm for which thyroid sonogram is recommended on a nonemergent basis.  Electronically Signed   By: Awilda Metro   On: 02/05/2014 05:40   02/05/2014   CLINICAL DATA:  RIGHT extremity weakness. History of seizure, headache, viral encephalitis and meningitis.  EXAM: MRI HEAD WITHOUT CONTRAST  MRI CERVICAL SPINE WITHOUT CONTRAST  TECHNIQUE: Multiplanar, multiecho pulse sequences of the brain and surrounding structures, and cervical spine, to include the craniocervical junction and cervicothoracic junction, were obtained without intravenous contrast. Patient did not wish to continue with postcontrast imaging.  COMPARISON:  CT of the head and cervical spine February 04, 2014  FINDINGS: MRI HEAD FINDINGS  The ventricles and sulci are normal. No suspicious parenchymal signal, mass lesions or mass of affect. Subcentimeter focus  of bright T2 signal within the RIGHT putamen. A few scattered subcentimeter supratentorial white matter FLAIR T2 hyperintensities suggest sequelae of chronic small vessel ischemic disease, less than expected for age. No reduced diffusion to suggest acute ischemia. No susceptibility artifact to suggest hemorrhage.  No abnormal extra-axial fluid collection. No abnormal calvarial bone marrow signal. Ocular globes and orbital contents are unremarkable though not tailored for evaluation. Visualized paranasal sinuses and mastoid air cells appear well-aerated. No abnormal sellar expansion. Craniocervical junction is maintained. Minimal susceptibility artifact within the scalp.  MRI CERVICAL SPINE FINDINGS  Mild motion degraded examination. The cervical spinal cord is expanded from approximate C2 through T1, measuring up to 10 mm in AP dimension, with focal T2 bright lesions within the RIGHT lateral spinal cord measuring up to 3 mm at C3 through C5. The cord signal abnormality is predominately on the RIGHT. No syrinx. Craniocervical junction maintained.  Cervical vertebral bodies appear intact and aligned with maintenance of cervical lordosis. Moderate to severe C3-4 disc height loss, at least moderate at C4-5 and C5-6. Decreased T2 signal within all cervical discs most consistent with at least moderate desiccation. Moderate acute on chronic discogenic endplate change at C3-4, moderate chronic discogenic endplate changes C4-5 and C5-6. No suspicious STIR signal abnormality to suggest fracture.  Level by level evaluation:  C2-3: 2 mm central disc protrusion, no canal stenosis or neural foraminal narrowing.  C3-4: 3 mm broad-based disc bulge, uncovertebral hypertrophy and moderate LEFT facet arthropathy. No mild canal stenosis. Mild to moderate LEFT neural foraminal narrowing.  C4-5: 2 mm broad-based disc bulge, uncovertebral hypertrophy. Moderate LEFT and mild RIGHT facet arthropathy. Mild canal stenosis. Mild bilateral  neural foraminal narrowing.  C5-6: 3 mm broad-based disc bulge, uncovertebral hypertrophy. Mild facet arthropathy. Mild canal stenosis. Mild RIGHT neural foraminal narrowing.  C6-7: 2 mm broad-based disc bulge, uncovertebral hypertrophy mild facet arthropathy. No canal stenosis or neural foraminal narrowing.  C7-T1: No disc bulge, canal stenosis nor neural foraminal narrowing.  IMPRESSION: MRI of the head: No acute intracranial process. Contrast enhanced sequences may be more sensitive for assessment of reported meningitis.  Remote RIGHT putaminal lacunar infarct. Minimal white matter changes suggest chronic small vessel ischemic disease.  MRI of the cervical spine: Abnormal expansile signal within the spinal cord from C2 through approximately T1, predominately in the RIGHT associated with a 3 mm T2 bright cord lesion: Findings may reflect infectious or inflammatory/demyelinating process, metastasis or primary neoplasm is a consideration given the degree of edema. Recommend MRI of the cervical spine with contrast and correlation with CSF studies.  Cervical spondylosis. Mild canal stenosis C4-5 and C5-6. Neural foraminal narrowing C3-4 through C5-6: Mild to moderate on the LEFT at C3-4.  Electronically Signed: By: Awilda Metro On: 02/05/2014 03:26   Mr Thoracic Spine W Wo Contrast  02/05/2014   CLINICAL DATA:  Right lower extremity weakness. History of viral encephalitis and meningitis.  EXAM: MRI THORACIC SPINE WITHOUT AND WITH CONTRAST  TECHNIQUE: Multiplanar and multiecho pulse sequences of the thoracic spine were obtained without and with intravenous contrast.  CONTRAST:  10mL MULTIHANCE GADOBENATE DIMEGLUMINE 529 MG/ML IV SOLN  COMPARISON:  MRI of the cervical spine an MRI the brain 02/04/2014.  FINDINGS: Abnormal cord signal within the cervical spine extends to the level of T1. No additional cord signal abnormality is present through the remainder of the thoracic spine. The conus medullaris is at L1,  within normal limits. Minimal endplate marrow changes are noted inferiorly at T8. Marrow signal, vertebral body heights, alignment are otherwise normal.  Shallow disc protrusions are present at T3-4, T4-5, T5-6, and T6-7. Slight disc bulging is present at T10-11. The foramina are patent bilaterally.  Postcontrast images demonstrate no pathologic enhancement within the thoracic spine.  IMPRESSION: 1. Abnormal cord signal within the cervical spine extends to the level of T1. 2. Remainder of the thoracic spine is within normal limits. 3. Minimal disc bulging at multiple levels as described. There is no focal stenosis.   Electronically Signed   By: Gennette Pac M.D.   On: 02/05/2014 17:54    Assessment/Plan: Diagnosis: cervical myelitis with right hemiparesis 1. Does the need for close, 24 hr/day medical supervision in concert with the patient's rehab needs make it unreasonable for this patient to be served in a less intensive setting? Yes 2. Co-Morbidities requiring supervision/potential complications: seizures 3. Due to bladder management, bowel management, safety, skin/wound care, disease management, medication administration, pain management and patient education, does the patient require 24 hr/day rehab nursing? Yes 4. Does the patient require coordinated care of a physician, rehab nurse, PT (1-2 hrs/day, 5 days/week) and OT (1-2 hrs/day, 5 days/week) to address physical and functional deficits in the context of the above medical diagnosis(es)? Yes Addressing deficits in the following areas: balance, endurance, locomotion, strength, transferring, bowel/bladder control, bathing, dressing, feeding, grooming, toileting and psychosocial support 5. Can the patient actively participate in an intensive therapy program of at least 3 hrs of therapy per day at least 5 days per week? Yes 6. The potential for patient to make measurable gains while on inpatient rehab is excellent 7. Anticipated functional outcomes  upon discharge from inpatient rehab are modified independent  with PT, modified independent with OT, n/a with SLP. 8. Estimated rehab length of stay to reach the above functional goals is: 7 days 9. Does the patient have adequate social supports to accommodate these discharge functional goals? Yes 10. Anticipated D/C setting: Home 11. Anticipated post D/C treatments: HH therapy and Outpatient therapy 12. Overall Rehab/Functional Prognosis: good  RECOMMENDATIONS: This patient's condition is appropriate for continued rehabilitative care in the following setting: CIR Patient has agreed to participate in recommended program. Potentially Note that insurance prior authorization may be required for reimbursement for recommended care.  Comment: Pt would like to speak with family. Will follow along as work up proceeds.   Ranelle Oyster, MD, [facility] Physical Medicine & Rehabilitation 02/06/2014     02/06/2014

## 2014-02-06 NOTE — Progress Notes (Signed)
Subjective: No changes  Exam: Filed Vitals:   02/06/14 1817  BP: 131/55  Pulse: 77  Temp: 98.6 F (37 C)  Resp: 18   Gen: In bed, NAD MS: awake, alert, interactive and appropriate RV:UYEBX, EOMI, face symmetric Motor: extension > flexion weakness of the RUE, mild right hip weakness, full on left.  Sensory:decreased mildly in right arm.   SSA neg SSB neg ACE neg HIV neg ESR 54, CRP nml nmo pending   ANA: 1:160, homogenous.    Impression: 63 yo F with transverse myelitis.   Recommendations: 1) smith, Jo-1, Ds DNA, Scl-70 Ab 2) LP with tests as previously specified.  3) continue solumedrol.   Roland Rack, MD [facility] [phone]  If 7pm- 7am, please page neurology on call as listed in [place].

## 2014-02-06 NOTE — Progress Notes (Signed)
02/06/14 1200  PT Visit Information  Last PT Received On 02/06/14  Assistance Needed +1  History of Present Illness 63 y.o. admitted with right arm weakness and leg weakness for 2 weeks with abnormal .MRI revealed remote RIGHT putaminal lacunar infarct. Abnormal cord signal within the cervical spine extends to the level of T1. Abnormal expansile signal within the spinal cord from C2 through approximately T1, predominately in the RIGHT associated with a 3 mm T2 bright cord lesion: Findings may reflect infectious or inflammatory/demyelinating process, metastasis or primary neoplasm is a consideration given the degree of edema Asymmetric right-sided enhancement of the spinal cord from C3-4 through C6-7. The finding is most compatible with transverse myelitis or ADEM. There is no discrete enhancement to suggest tumor. Pending lumbar puncture . Workup to r/o autoimmune disorder currently PMH:  Seizures, HA, viral encephalitis, viral meningitis   PT Time Calculation  PT Start Time 1134  PT Stop Time 1159  PT Time Calculation (min) 25 min  Precautions  Precautions Fall  Pain Assessment  Pain Assessment No/denies pain  Cognition  Arousal/Alertness Awake/alert  Behavior During Therapy Impulsive  Overall Cognitive Status Impaired/Different from baseline  Area of Impairment Safety/judgement  Safety/Judgement Decreased awareness of deficits;Decreased awareness of safety  General Comments pt requires VC for safety with use of RW  Bed Mobility  Overal bed mobility Needs Assistance  Bed Mobility Supine to Sit;Sit to Supine  Supine to sit HOB elevated;Supervision  Sit to supine Supervision  Transfers  Overall transfer level Needs assistance  Equipment used Rolling walker (2 wheeled)  Transfers Sit to/from Stand  Sit to Stand Min guard  General transfer comment pt does not demonstrates LOB with standing, some instability when R foot not positioned under patient. Pt able to identify problem and correct,  requires use of UE to position R foot. VC for technique  Ambulation/Gait  Ambulation/Gait assistance Min assist  Ambulation Distance (Feet) 120 Feet  Assistive device Rolling walker (2 wheeled)  Gait Pattern/deviations Step-through pattern;Decreased stance time - right;Decreased step length - left;Decreased dorsiflexion - right;Decreased weight shift to right;Shuffle;Ataxic;Staggering right;Wide base of support (R toe out)  Gait velocity slow  General Gait Details Pt require min guard to min A with RW. Min A required for occasional LOB. 2 bouts of near LOB requiring assist to correct. Hip drop on L 2/2 to R hip weaknes during functional activity. R drop foot still present and would benefit from AFO if continues to be issue. Pt demonstrates difficulty with all aspects of DGI.  Stairs Yes  Stairs assistance Min assist  Stair Management Two rails;Alternating pattern;Step to pattern  Number of Stairs 3  General stair comments heavy reliance on UE for support. Ascend: initially, step-to pattern, leading with R LE. assist required for stability. for steps 2-3, pt alternating. increased instability and increased assistance required. Descend: Leading with L LE. alternating down. Difficutly with R foot placement on step, stepped down with half of R foot off step. Assistance for stability.   Balance  Overall balance assessment Needs assistance  Standardized Balance Assessment  Standardized Balance Assessment  Dynamic Gait Index  Dynamic Gait Index  Level Surface 1 (abnormal gait pattern on R)  Change in Gait Speed 0 (Assist required to prevent LOB)  Gait with Horizontal Head Turns 1  Gait with Vertical Head Turns 1  Gait and Pivot Turn 2  Step Over Obstacle 1  Step Around Obstacles 1  Steps 1 (step-to, then alternating with increased instability )  Total Score  8  General Comments  General comments (skin integrity, edema, etc.) discussed impairments and weakness, encouraged pt to perform hip ER  in sidelying and ankle pumps. With performing L hip flex, last 5 reps with facilitation of knee ext and hip ext/abd. pt instructed in technqiue for stairs to improve efficiency and safety, but not performed.   Exercises  Exercises General Lower Extremity  General Exercises - Lower Extremity  Hip Flexion/Marching Standing;AROM;Both;10 reps (with RW.)  PT - End of Session  Equipment Utilized During Treatment Gait belt  Activity Tolerance Patient tolerated treatment well  Patient left in bed;with call bell/phone within reach;with family/visitor present (sitting EOB)  Nurse Communication Mobility status  PT - Assessment/Plan  PT Frequency Min 4X/week  Follow Up Recommendations CIR  PT equipment Rolling walker with 5" wheels;3in1 (PT)  Acute Rehab PT Goals  PT Goal Formulation With patient  Time For Goal Achievement 02/12/14  Potential to Achieve Goals Good    Clinical Impression: Pt progressing towards goals, but still demonstrating impairments reducing overall stability and safety with mobility. Pt requires frequent assistance and cueing during functional mobility. During ambulation, pt demonstrating instability with 2 bouts of LOB requiring assist to correct. Pt with score of 8 on DGI, indicating high fall risk. Pt would greatly benefit from CIR to improve safety with mobility and further progress pt towards PTA functional status.    Cathlyn Parsons, SPT

## 2014-02-06 NOTE — Progress Notes (Signed)
Rehab Admissions Coordinator Note:  Patient was screened by Trish Mage for appropriateness for an Inpatient Acute Rehab Consult.  At this time, we are recommending Inpatient Rehab consult.  Lelon Frohlich M 02/06/2014, 10:15 AM  I can be reached at [phone].

## 2014-02-06 NOTE — Progress Notes (Signed)
ANTIBIOTIC CONSULT NOTE - INITIAL  Pharmacy Consult for vancomycin, ceftriaxone and acyclovir Indication: r/o meningitis  No Known Allergies  Patient Measurements: Height: 5' (152.4 cm) Weight: 105 lb (47.628 kg) IBW/kg (Calculated) : 45.5  Vital Signs: Temp: 98.2 F (36.8 C) (10/01 2206) Temp Source: Oral (10/01 2206) BP: 173/70 mmHg (10/01 2206) Pulse Rate: 67 (10/01 2206) Intake/Output from previous day: 09/30 0701 - 10/01 0700 In: 720 [P.O.:720] Out: 200 [Urine:200] Intake/Output from this shift:    Labs:  Recent Labs  02/04/14 1825 02/06/14 1535  WBC 4.8  --   HGB 11.3*  --   PLT 280  --   CREATININE 0.40* 0.48*   Estimated Creatinine Clearance: 53.7 ml/min (by C-G formula based on Cr of 0.48). No results found for this basename: VANCOTROUGH, Leodis Binet, VANCORANDOM, GENTTROUGH, GENTPEAK, GENTRANDOM, TOBRATROUGH, TOBRAPEAK, TOBRARND, AMIKACINPEAK, AMIKACINTROU, AMIKACIN,  in the last 72 hours   Microbiology: Recent Results (from the past 720 hour(s))  GRAM STAIN     Status: None   Collection Time    02/06/14  7:20 PM      Result Value Ref Range Status   Specimen Description CSF   Final   Special Requests NONE   Final   Gram Stain     Final   Value: WBC PRESENT, PREDOMINANTLY MONONUCLEAR     NO ORGANISMS SEEN     CYTOSPIN SLIDE   Report Status 02/06/2014 FINAL   Final    Medical History: Past Medical History  Diagnosis Date  . Seizure   . HA (headache)   . Viral encephalitis   . Viral meningitis     Medications:  No prescriptions prior to admission   Assessment: 63 yo lady to start empiric antibiotics r/o meningitis.    Goal of Therapy:  Vancomycin trough level 15-20 mcg/ml Eradication of infection  Plan:  Ceftriaxone 2 gm IV q12 hours Vancomycin 1000 mg IV X 1 then 1gm IV q24 hours Acyclovir 475 (10 mg/kg) mg IV q8 hours  Thanks for allowing pharmacy to be a part of this patient's care.  Talbert Cage, PharmD Clinical Pharmacist,  [phone] 02/06/2014,10:35 PM

## 2014-02-06 NOTE — Progress Notes (Addendum)
Agree with SPT treatment session. Shelva MajesticWest, [place], [place] [phone] Charges: 2 Gait Training

## 2014-02-06 NOTE — Progress Notes (Signed)
CRITICAL VALUE ALERT  Critical value received: WBC 54  Date of notification: 02/06/14  Time of notification:  2213  Critical value read back:Yes.    Nurse who received alert:  Herma ArdEllie Messer RN  MD notified (1st page):  Benedetto Coons. Callahan NP  Time of first page: 2216 MD notified (2nd page):  Time of second page:  Responding MD: Benedetto Coons. Callahan NP  Time MD responded: 2231  New orders given, and carried out. Will continue to monitor. Herma ArdMesser, Cordale Manera RN BSN

## 2014-02-07 DIAGNOSIS — M6289 Other specified disorders of muscle: Secondary | ICD-10-CM

## 2014-02-07 LAB — CBC
HCT: 34.8 % — ABNORMAL LOW (ref 36.0–46.0)
Hemoglobin: 11.8 g/dL — ABNORMAL LOW (ref 12.0–15.0)
MCH: 30.6 pg (ref 26.0–34.0)
MCHC: 33.9 g/dL (ref 30.0–36.0)
MCV: 90.4 fL (ref 78.0–100.0)
PLATELETS: 324 10*3/uL (ref 150–400)
RBC: 3.85 MIL/uL — AB (ref 3.87–5.11)
RDW: 14 % (ref 11.5–15.5)
WBC: 7.2 10*3/uL (ref 4.0–10.5)

## 2014-02-07 LAB — BASIC METABOLIC PANEL
ANION GAP: 14 (ref 5–15)
BUN: 14 mg/dL (ref 6–23)
CHLORIDE: 100 meq/L (ref 96–112)
CO2: 24 meq/L (ref 19–32)
CREATININE: 0.42 mg/dL — AB (ref 0.50–1.10)
Calcium: 9.6 mg/dL (ref 8.4–10.5)
GFR calc non Af Amer: >90 mL/min (ref >90)
Glucose, Bld: 142 mg/dL — ABNORMAL HIGH (ref 70–99)
POTASSIUM: 4.5 meq/L (ref 3.7–5.3)
Sodium: 138 mEq/L (ref 137–147)

## 2014-02-07 LAB — PATHOLOGIST SMEAR REVIEW

## 2014-02-07 NOTE — Progress Notes (Signed)
Physical Therapy Treatment Patient Details Name: Taylor CoJulia Matherne MRN: [id number] DOB: 03/02/1951 Today's Date: 02/07/2014    History of Present Illness 63 y.o. admitted with right arm weakness and leg weakness for 2 weeks with abnormal .MRI revealed remote RIGHT putaminal lacunar infarct. Abnormal cord signal within the cervical spine extends to the level of T1. Abnormal expansile signal within the spinal cord from C2 through approximately T1, predominately in the RIGHT associated with a 3 mm T2 bright cord lesion: Findings may reflect infectious or inflammatory/demyelinating process, metastasis or primary neoplasm is a consideration given the degree of edema Asymmetric right-sided enhancement of the spinal cord from C3-4 through C6-7. The finding is most compatible with transverse myelitis or ADEM. There is no discrete enhancement to suggest tumor. Pending lumbar puncture . Workup to r/o autoimmune disorder currently PMH:  Seizures, HA, viral encephalitis, viral meningitis     PT Comments    Pt in bed with family in room.  Assisted OOb to amb to BR.  Very unsteady gait (see below).  Assisted with don/doffing pants as pt was unable to support self balance and perform task.  Poor self awareness of R LE as her leg was way too behind commode as she attempted to stand.  Amb in hallway limited distance. Pt would benefit from CIR  Follow Up Recommendations  CIR     Equipment Recommendations  Rolling walker with 5" wheels;3in1 (PT)    Recommendations for Other Services       Precautions / Restrictions Precautions Precautions: Fall Restrictions Weight Bearing Restrictions: No    Mobility  Bed Mobility Overal bed mobility: Needs Assistance Bed Mobility: Supine to Sit;Sit to Supine     Supine to sit: HOB elevated;Supervision     General bed mobility comments: impulsive needing VC's to functionally use R UE and R LE as R would be tangled in blankets and neglecting use R UE (IV).    Transfers Overall transfer level: Needs assistance Equipment used: Rolling walker (2 wheeled) Transfers: Sit to/from Stand Sit to Stand: Min guard;Min assist;Mod assist         General transfer comment: any where from MinGuard to Mod assist. Inconsistant.  Impulsive.  Poor awareness R LE placement.  HIGH FALL RISK.  Incomplete turns and poor steppage R LE.  Poor motor control.  Pt requires VC's to increase functional use R LE esp for push off and hand placement with stand to sit.    Ambulation/Gait Ambulation/Gait assistance: Min assist;Mod assist;Max assist   Assistive device: Rolling walker (2 wheeled) Gait Pattern/deviations: Step-to pattern;Step-through pattern;Decreased stance time - right;Narrow base of support;Ataxic;Staggering right Gait velocity: decreased    General Gait Details: pt requires any where from MinGuard to MAX assist (inconsistant) very unsteady gait with poor motor control of R LE with both swing and steppage.  R foot drop still present. Poor self awareness and impilsive.  HIGH FALL RISK.     Stairs            Wheelchair Mobility    Modified Rankin (Stroke Patients Only)       Freight forwarderBalance                                    Cognition                            Exercises      General  Comments        Pertinent Vitals/Pain      Home Living                      Prior Function            PT Goals (current goals can now be found in the care plan section) Progress towards PT goals: Progressing toward goals    Frequency  Min 4X/week    PT Plan      Wood-evaluation             End of Session Equipment Utilized During Treatment: Gait belt Activity Tolerance: Patient tolerated treatment well Patient left: in bed;with call bell/phone within reach;with family/visitor present     Time: 1150-1205 PT Time Calculation (min): 15 min  Charges:  $Gait Training: 8-22 mins                    G Codes:       Felecia Shelling  PTA [facility]  Acute  Rehab Pager      [phone]

## 2014-02-07 NOTE — Progress Notes (Signed)
Rehab admissions - Evaluated for possible admission.  I met with patient and her daughters.  Patient has 4 daughters who plan to care for her at home.  Patient has been walking to the bathroom with help and ambulated in the hallway today.  Family reports that patient did much better today and they are comfortable going home with [facility] therapies.  I will not pursue inpatient rehab admission since patient prefers to go home and is doing so much better.  Call me for questions.  #[phone]

## 2014-02-07 NOTE — Progress Notes (Signed)
Subjective: No changes  Exam: Filed Vitals:   02/07/14 1721  BP: 116/51  Pulse: 72  Temp: 99.1 F (37.3 C)  Resp: 20   Gen: In bed, NAD MS: awake, alert, interactive and appropriate VE:XOGAC, EOMI, face symmetric Motor: extension > flexion weakness of the RUE, mild right hip weakness, full on left.  Sensory:decreased mildly in right arm.   SSA neg SSB neg ACE neg HIV neg ESR 54, CRP nml nmo pending RF neg Anti jo , smith, ds dna - pending Lyme titers pending and   CSF: WBC 54 Protein 48 Glucose 88 VDRL(pending) Oligoclonal bands and IgG index - pending Enterovitrus PCR pending HSV pendgin CMV pending [place] and IgM pending  ANA: 1:160, homogenous.    Impression: 63 yo F with transverse myelitis. Etiological workup proceeding.   Recommendations: 1) smith, Jo-1, Ds DNA, Scl-70 Ab 2) LP with tests as previously specified.  3) continue solumedrol (day 3/5)  Roland Rack, MD [facility] [phone]  If 7pm- 7am, please page neurology on call as listed in [place].

## 2014-02-07 NOTE — Progress Notes (Signed)
Occupational Therapy Treatment Patient Details Name: Taylor Wood MRN: [id number] DOB: Jul 21, 1950 Today's Date: 02/07/2014    History of present illness 63 y.o. admitted with right arm weakness and leg weakness for 2 weeks with abnormal .MRI revealed remote RIGHT putaminal lacunar infarct. Abnormal cord signal within the cervical spine extends to the level of T1. Abnormal expansile signal within the spinal cord from C2 through approximately T1, predominately in the RIGHT associated with a 3 mm T2 bright cord lesion: Findings may reflect infectious or inflammatory/demyelinating process, metastasis or primary neoplasm is a consideration given the degree of edema Asymmetric right-sided enhancement of the spinal cord from C3-4 through C6-7. The finding is most compatible with transverse myelitis or ADEM. There is no discrete enhancement to suggest tumor. Pending lumbar puncture . Workup to r/o autoimmune disorder currently PMH:  Seizures, HA, viral encephalitis, viral meningitis    OT comments  This 63 yo female is making progress; however still limited use/control of RUE/RLE thus affecting her safety and independence with BADLs. I really feel pt would benefit from CIR and I have discussed this with her and her family and my reasoning behind it--patient really prefers to go home, but is still open to considering CIR potentially come Monday. If she does go home with family instead, I made pt and daughter who was in room very aware that anytime pt is up on her feet family needs to physically be helping her--they agreed. Her daughter is aware that I am recommending a tub transfer bench and she will look into getting one for her mom.   Follow Up Recommendations  CIR    Equipment Recommendations   (family reports they will get pt a tub bench)       Precautions / Restrictions Precautions Precautions: Fall Restrictions Weight Bearing Restrictions: No       Mobility Bed Mobility Overal bed mobility:  Modified Independent Bed Mobility: Supine to Sit;Sit to Supine     Supine to sit: Modified independent (Device/Increase time);HOB elevated Sit to supine: Modified independent (Device/Increase time);HOB elevated    Transfers Overall transfer level: Needs assistance Equipment used: 1 person hand held assist Transfers: Sit to/from Stand Sit to Stand: Min assist         General transfer comment: Worked on standing and lifting LLE off of floor while RLE was blocked using my LLE against hers while I was standing on her left side. VCs to stand up tall and look forward while doing this activity. I also taught her daughter who was in the room with her how to do this with her.        ADL Overall ADL's : Needs assistance/impaired                     Lower Body Dressing: Minimal assistance;Sit to/from stand (due to decreased strength in RLE)                                  Cognition   Behavior During Therapy: [facility] for tasks assessed/performed Overall Cognitive Status: Within Functional Limits for tasks assessed                         Exercises Other Exercises Other Exercises: Added exercises/activities for pt to do with her RUE. 1. Working on slow control of bending and straigtening her elbow (not letting her arm or wrist flop); open/close hand;  finger taps, spread/close fingers; wrist up and down with finger taps; putty: roll in to a ball, flatten into a pancake without A of other hand, rolling into a log, folding log over---gave her hands on all of this and told her she needs to work on these some more today and over the weekend           Pertinent Vitals/ Pain       Pain Assessment: No/denies pain         Frequency Min 3X/week     Progress Toward Goals  OT Goals(current goals can now be found in the care plan section)  Progress towards OT goals: Progressing toward goals     Plan Discharge plan needs to be updated          Activity  Tolerance Patient tolerated treatment well   Patient Left in bed;with call bell/phone within reach;with family/visitor present           Time: 1249-1355 OT Time Calculation (min): 66 min  Charges: OT General Charges $OT Visit: 1 Procedure OT Treatments $Self Care/Home Management : 8-22 mins $Therapeutic Activity: 8-22 mins $Therapeutic Exercise: 23-37 mins  Evette GeorgesLeonard, Reyanna Baley Eva [phone] 02/07/2014, 3:14 PM

## 2014-02-07 NOTE — Progress Notes (Signed)
[facility] PROGRESS NOTE  Taylor Wood FHS:[id number] DOB: Jul 20, 1950 DOA: 02/04/2014 PCP: Taylor Rio, PA-C  Assessment/Plan: Principal Problem:   Right arm weakness    Assessment and plan  Right arm weakness, RLE weakness secondary to transverse myelitis,  Remote RIGHT putaminal lacunar infarct. Minimal white matter changes  MRI of the cervical spine: Findings may  reflect infectious or inflammatory/demyelinating process, metastasis  or primary neoplasm is a consideration given the degree of edema.  MRI findings concerning for myelitis of unclear etiology. Differential diagnosis includes multiple sclerosis, and neuromyelitis optica, infectious myelitis, autoimmune disease, metabolic myelopathy.  Patient to have lumbar puncture done today   SSA neg  SSB neg  ACE neg  HIV neg  ESR 54, CRP nml  nmo pending   smith, Jo-1, Ds DNA, Scl-70 Ab-pending  3) CSF evaluation showed increased WBC in CSF with primary lymphocytosis, mildly elevated protein and glucose-patient empirically started on acyclovir, vancomycin, Rocephin for suspected meningitis  Pending oligoclonal bands, IgG index.  4) pending tests CSF PCR for HSV-1, HSV-2, HHV-6, VZV, CMV, EBV, enteroviruses 4) HIV nonreactive 5) MRI C-spine, T-spine -Abnormal cord signal within the cervical spine extends to the  level of T1. 6) continue Solumedrol 574m BID  PT/OT consult -recommend CIR  2-D echo discontinued as it is not indicated at this time   Prediabetes  Hemoglobin A1c 6.3 , CBg stable  Hypokalemia  Repleted   Code Status: full  Family Communication: family updated about patient's clinical progress  Disposition Plan: CIR consult ordered    Brief narrative:  JShelvie Salsberryis an 63y.o. female With past history of viral meningitis one year ago. At that time she had a ANA titer of 1:80. Pateint has had no problems since then. Two weeks ago she noted she had right shoulder pain. She went to urgent care  where xray of shoulder was normal. Shoulder pain continued and spread to the whole right arm. One week later she noted her right leg was weaker and she ad more difficulty with walking and at time keeping her balance. At no time did she notice a decreased sensation in right leg or arm. She came to ED on this visit due to her right shoulder continued to have pain. MRI was obtained showing abnormal signal in the cervical spine and Neurology was consulted. She has received a one time dose of 10 mg Decadron while in the ED.  Consultants:  Neurology Procedures:  None Antibiotics:  None     HPI/Subjective: Apparently patient is very unsteady on her feet, with ambulation Status post lumbar puncture last night  Objective: Filed Vitals:   02/07/14 0200 02/07/14 0554 02/07/14 0900 02/07/14 0932  BP: 143/57 133/63 131/89 144/61  Pulse: 75 68 76 81  Temp: 98.5 F (36.9 C) 98.4 F (36.9 C) 98.6 F (37 C) 98 F (36.7 C)  TempSrc: Oral Oral Oral Oral  Resp: 16 18 18 20   Height:      Weight:      SpO2: 100% 99% 99% 98%    Intake/Output Summary (Last 24 hours) at 02/07/14 1012 Last data filed at 02/06/14 1700  Gross per 24 hour  Intake    360 ml  Output      0 ml  Net    360 ml    Exam:  General: alert & oriented x 3 In NAD  Cardiovascular: RRR, nl S1 s2  Respiratory: Decreased breath sounds at the bases, scattered rhonchi, no crackles  Abdomen: soft +BS  NT/ND, no masses palpable  Extremities: Still has decreased strength on the right side      Data Reviewed: Basic Metabolic Panel:  Recent Labs Lab 02/04/14 1825 02/06/14 1535 02/07/14 0522  NA 140 142 138  K 3.1* 4.0 4.5  CL 100 102 100  CO2 25 26 24   GLUCOSE 119* 180* 142*  BUN 10 16 14   CREATININE 0.40* 0.48* 0.42*  CALCIUM 9.2 9.6 9.6    Liver Function Tests:  Recent Labs Lab 02/06/14 1535  AST 18  ALT 13  ALKPHOS 72  BILITOT <0.2*  PROT 7.9  ALBUMIN 3.5   No results found for this basename: LIPASE,  AMYLASE,  in the last 168 hours No results found for this basename: AMMONIA,  in the last 168 hours  CBC:  Recent Labs Lab 02/04/14 1825 02/07/14 0522  WBC 4.8 7.2  NEUTROABS 2.5  --   HGB 11.3* 11.8*  HCT 33.4* 34.8*  MCV 90.0 90.4  PLT 280 324    Cardiac Enzymes: No results found for this basename: CKTOTAL, CKMB, CKMBINDEX, TROPONINI,  in the last 168 hours BNP (last 3 results) No results found for this basename: PROBNP,  in the last 8760 hours   CBG: No results found for this basename: GLUCAP,  in the last 168 hours  Recent Results (from the past 240 hour(s))  CSF CULTURE     Status: None   Collection Time    02/06/14  7:20 PM      Result Value Ref Range Status   Specimen Description CSF   Final   Special Requests NO 4 4CC   Final   Gram Stain     Final   Value: CYTSOSPIN SLIDE WBC PRESENT, PREDOMINANTLY MONONUCLEAR     NO ORGANISMS SEEN     Performed at [facility]     Performed at [facility]   Culture     Final   Value: NO GROWTH     Performed at [facility]   Report Status PENDING   Incomplete  GRAM STAIN     Status: None   Collection Time    02/06/14  7:20 PM      Result Value Ref Range Status   Specimen Description CSF   Final   Special Requests NONE   Final   Gram Stain     Final   Value: WBC PRESENT, PREDOMINANTLY MONONUCLEAR     NO ORGANISMS SEEN     CYTOSPIN SLIDE   Report Status 02/06/2014 FINAL   Final     Studies: Dg Shoulder Right  01/25/2014   CLINICAL DATA:  Right shoulder pain.  No trauma.  EXAM: RIGHT SHOULDER - 2+ VIEW  COMPARISON:  None.  FINDINGS: Normal anatomic alignment. No evidence for acute fracture or dislocation. No significant osseous degenerative change. Visualized right hemi thorax is unremarkable.  IMPRESSION: No acute osseous abnormality.   Electronically Signed   By: Lovey Newcomer M.D.   On: 01/25/2014 13:05   Ct Head Wo Contrast  02/04/2014   CLINICAL DATA:  Right-sided weakness.  EXAM: CT HEAD  WITHOUT CONTRAST  CT CERVICAL SPINE WITHOUT CONTRAST  TECHNIQUE: Multidetector CT imaging of the head and cervical spine was performed following the standard protocol without intravenous contrast. Multiplanar CT image reconstructions of the cervical spine were also generated.  COMPARISON:  CT scan of head of January 18, 2013.  FINDINGS: CT HEAD FINDINGS  Bony calvarium appears intact. No mass effect or midline shift  is noted. Ventricular size is within normal limits. There is no evidence of mass lesion, hemorrhage or acute infarction.  CT CERVICAL SPINE FINDINGS  Mild levoscoliosis is noted. No fracture or spondylolisthesis is noted. Moderate degenerative disc disease is noted at C3-4, C4-5 and C5-6 with anterior osteophyte formation. Mild degenerative changes are seen involving the posterior facet joints.  IMPRESSION: Moderate multilevel degenerative disc disease is noted in the cervical spine. No acute abnormality seen in the cervical spine.  Normal head CT.   Electronically Signed   By: Sabino Dick M.D.   On: 02/04/2014 18:49   Ct Cervical Spine Wo Contrast  02/04/2014   CLINICAL DATA:  Right-sided weakness.  EXAM: CT HEAD WITHOUT CONTRAST  CT CERVICAL SPINE WITHOUT CONTRAST  TECHNIQUE: Multidetector CT imaging of the head and cervical spine was performed following the standard protocol without intravenous contrast. Multiplanar CT image reconstructions of the cervical spine were also generated.  COMPARISON:  CT scan of head of January 18, 2013.  FINDINGS: CT HEAD FINDINGS  Bony calvarium appears intact. No mass effect or midline shift is noted. Ventricular size is within normal limits. There is no evidence of mass lesion, hemorrhage or acute infarction.  CT CERVICAL SPINE FINDINGS  Mild levoscoliosis is noted. No fracture or spondylolisthesis is noted. Moderate degenerative disc disease is noted at C3-4, C4-5 and C5-6 with anterior osteophyte formation. Mild degenerative changes are seen involving the  posterior facet joints.  IMPRESSION: Moderate multilevel degenerative disc disease is noted in the cervical spine. No acute abnormality seen in the cervical spine.  Normal head CT.   Electronically Signed   By: Sabino Dick M.D.   On: 02/04/2014 18:49   Mr Jeri Cos ZY Contrast  02/05/2014   ADDENDUM REPORT: 02/05/2014 05:40  ADDENDUM: Partially imaged heterogeneous RIGHT thyroid lesion, predominately cystic measuring up to 19 mm for which thyroid sonogram is recommended on a nonemergent basis.   Electronically Signed   By: Elon Alas   On: 02/05/2014 05:40   02/05/2014   CLINICAL DATA:  RIGHT extremity weakness. History of seizure, headache, viral encephalitis and meningitis.  EXAM: MRI HEAD WITHOUT CONTRAST  MRI CERVICAL SPINE WITHOUT CONTRAST  TECHNIQUE: Multiplanar, multiecho pulse sequences of the brain and surrounding structures, and cervical spine, to include the craniocervical junction and cervicothoracic junction, were obtained without intravenous contrast. Patient did not wish to continue with postcontrast imaging.  COMPARISON:  CT of the head and cervical spine February 04, 2014  FINDINGS: MRI HEAD FINDINGS  The ventricles and sulci are normal. No suspicious parenchymal signal, mass lesions or mass of affect. Subcentimeter focus of bright T2 signal within the RIGHT putamen. A few scattered subcentimeter supratentorial white matter FLAIR T2 hyperintensities suggest sequelae of chronic small vessel ischemic disease, less than expected for age. No reduced diffusion to suggest acute ischemia. No susceptibility artifact to suggest hemorrhage.  No abnormal extra-axial fluid collection. No abnormal calvarial bone marrow signal. Ocular globes and orbital contents are unremarkable though not tailored for evaluation. Visualized paranasal sinuses and mastoid air cells appear well-aerated. No abnormal sellar expansion. Craniocervical junction is maintained. Minimal susceptibility artifact within the scalp.   MRI CERVICAL SPINE FINDINGS  Mild motion degraded examination. The cervical spinal cord is expanded from approximate C2 through T1, measuring up to 10 mm in AP dimension, with focal T2 bright lesions within the RIGHT lateral spinal cord measuring up to 3 mm at C3 through C5. The cord signal abnormality is predominately on the RIGHT. No  syrinx. Craniocervical junction maintained.  Cervical vertebral bodies appear intact and aligned with maintenance of cervical lordosis. Moderate to severe C3-4 disc height loss, at least moderate at C4-5 and C5-6. Decreased T2 signal within all cervical discs most consistent with at least moderate desiccation. Moderate acute on chronic discogenic endplate change at V9-4, moderate chronic discogenic endplate changes I0-1 and C5-6. No suspicious STIR signal abnormality to suggest fracture.  Level by level evaluation:  C2-3: 2 mm central disc protrusion, no canal stenosis or neural foraminal narrowing.  C3-4: 3 mm broad-based disc bulge, uncovertebral hypertrophy and moderate LEFT facet arthropathy. No mild canal stenosis. Mild to moderate LEFT neural foraminal narrowing.  C4-5: 2 mm broad-based disc bulge, uncovertebral hypertrophy. Moderate LEFT and mild RIGHT facet arthropathy. Mild canal stenosis. Mild bilateral neural foraminal narrowing.  C5-6: 3 mm broad-based disc bulge, uncovertebral hypertrophy. Mild facet arthropathy. Mild canal stenosis. Mild RIGHT neural foraminal narrowing.  C6-7: 2 mm broad-based disc bulge, uncovertebral hypertrophy mild facet arthropathy. No canal stenosis or neural foraminal narrowing.  C7-T1: No disc bulge, canal stenosis nor neural foraminal narrowing.  IMPRESSION: MRI of the head: No acute intracranial process. Contrast enhanced sequences may be more sensitive for assessment of reported meningitis.  Remote RIGHT putaminal lacunar infarct. Minimal white matter changes suggest chronic small vessel ischemic disease.  MRI of the cervical spine: Abnormal  expansile signal within the spinal cord from C2 through approximately T1, predominately in the RIGHT associated with a 3 mm T2 bright cord lesion: Findings may reflect infectious or inflammatory/demyelinating process, metastasis or primary neoplasm is a consideration given the degree of edema. Recommend MRI of the cervical spine with contrast and correlation with CSF studies.  Cervical spondylosis. Mild canal stenosis C4-5 and C5-6. Neural foraminal narrowing C3-4 through C5-6: Mild to moderate on the LEFT at C3-4.  Electronically Signed: By: Elon Alas On: 02/05/2014 03:26   Mr Cervical Spine W Contrast  02/05/2014   CLINICAL DATA:  Right lower extremity weakness. History viral encephalitis an meningitis. Abnormal MRI of the cervical spine.  EXAM: MRI CERVICAL SPINE WITH CONTRAST  TECHNIQUE: Multiplanar and multiecho pulse sequences of the cervical spine, to include the craniocervical junction and cervicothoracic junction, were obtained according to standard protocol with intravenous contrast.  CONTRAST:  36m MULTIHANCE GADOBENATE DIMEGLUMINE 529 MG/ML IV SOLN  COMPARISON:  MRI of the cervical spine without contrast 02/04/2014.  FINDINGS: Asymmetric irregular enhancement is noted within the right side of the cord from C3-4 through C6-7, extending 3.7 cm. This is less than the total extent of T2 signal abnormality which extends to the level of T1. No other focal cord enhancement is present. There is no enhancement of the osseous spine. Disc disease is again noted.  IMPRESSION: Asymmetric right-sided enhancement of the spinal cord from C3-4 through C6-7. The finding is most compatible with transverse myelitis or ADEM. There is no discrete enhancement to suggest tumor. The precontrast pattern is atypical of multiple sclerosis.   Electronically Signed   By: CLawrence SantiagoM.D.   On: 02/05/2014 17:58   Mr Cervical Spine W Wo Contrast  02/05/2014   ADDENDUM REPORT: 02/05/2014 05:40  ADDENDUM: Partially  imaged heterogeneous RIGHT thyroid lesion, predominately cystic measuring up to 19 mm for which thyroid sonogram is recommended on a nonemergent basis.   Electronically Signed   By: CElon Alas  On: 02/05/2014 05:40   02/05/2014   CLINICAL DATA:  RIGHT extremity weakness. History of seizure, headache, viral encephalitis and meningitis.  EXAM: MRI HEAD WITHOUT CONTRAST  MRI CERVICAL SPINE WITHOUT CONTRAST  TECHNIQUE: Multiplanar, multiecho pulse sequences of the brain and surrounding structures, and cervical spine, to include the craniocervical junction and cervicothoracic junction, were obtained without intravenous contrast. Patient did not wish to continue with postcontrast imaging.  COMPARISON:  CT of the head and cervical spine February 04, 2014  FINDINGS: MRI HEAD FINDINGS  The ventricles and sulci are normal. No suspicious parenchymal signal, mass lesions or mass of affect. Subcentimeter focus of bright T2 signal within the RIGHT putamen. A few scattered subcentimeter supratentorial white matter FLAIR T2 hyperintensities suggest sequelae of chronic small vessel ischemic disease, less than expected for age. No reduced diffusion to suggest acute ischemia. No susceptibility artifact to suggest hemorrhage.  No abnormal extra-axial fluid collection. No abnormal calvarial bone marrow signal. Ocular globes and orbital contents are unremarkable though not tailored for evaluation. Visualized paranasal sinuses and mastoid air cells appear well-aerated. No abnormal sellar expansion. Craniocervical junction is maintained. Minimal susceptibility artifact within the scalp.  MRI CERVICAL SPINE FINDINGS  Mild motion degraded examination. The cervical spinal cord is expanded from approximate C2 through T1, measuring up to 10 mm in AP dimension, with focal T2 bright lesions within the RIGHT lateral spinal cord measuring up to 3 mm at C3 through C5. The cord signal abnormality is predominately on the RIGHT. No syrinx.  Craniocervical junction maintained.  Cervical vertebral bodies appear intact and aligned with maintenance of cervical lordosis. Moderate to severe C3-4 disc height loss, at least moderate at C4-5 and C5-6. Decreased T2 signal within all cervical discs most consistent with at least moderate desiccation. Moderate acute on chronic discogenic endplate change at M1-9, moderate chronic discogenic endplate changes Q2-2 and C5-6. No suspicious STIR signal abnormality to suggest fracture.  Level by level evaluation:  C2-3: 2 mm central disc protrusion, no canal stenosis or neural foraminal narrowing.  C3-4: 3 mm broad-based disc bulge, uncovertebral hypertrophy and moderate LEFT facet arthropathy. No mild canal stenosis. Mild to moderate LEFT neural foraminal narrowing.  C4-5: 2 mm broad-based disc bulge, uncovertebral hypertrophy. Moderate LEFT and mild RIGHT facet arthropathy. Mild canal stenosis. Mild bilateral neural foraminal narrowing.  C5-6: 3 mm broad-based disc bulge, uncovertebral hypertrophy. Mild facet arthropathy. Mild canal stenosis. Mild RIGHT neural foraminal narrowing.  C6-7: 2 mm broad-based disc bulge, uncovertebral hypertrophy mild facet arthropathy. No canal stenosis or neural foraminal narrowing.  C7-T1: No disc bulge, canal stenosis nor neural foraminal narrowing.  IMPRESSION: MRI of the head: No acute intracranial process. Contrast enhanced sequences may be more sensitive for assessment of reported meningitis.  Remote RIGHT putaminal lacunar infarct. Minimal white matter changes suggest chronic small vessel ischemic disease.  MRI of the cervical spine: Abnormal expansile signal within the spinal cord from C2 through approximately T1, predominately in the RIGHT associated with a 3 mm T2 bright cord lesion: Findings may reflect infectious or inflammatory/demyelinating process, metastasis or primary neoplasm is a consideration given the degree of edema. Recommend MRI of the cervical spine with contrast  and correlation with CSF studies.  Cervical spondylosis. Mild canal stenosis C4-5 and C5-6. Neural foraminal narrowing C3-4 through C5-6: Mild to moderate on the LEFT at C3-4.  Electronically Signed: By: Elon Alas On: 02/05/2014 03:26   Mr Thoracic Spine W Wo Contrast  02/05/2014   CLINICAL DATA:  Right lower extremity weakness. History of viral encephalitis and meningitis.  EXAM: MRI THORACIC SPINE WITHOUT AND WITH CONTRAST  TECHNIQUE: Multiplanar and multiecho pulse sequences  of the thoracic spine were obtained without and with intravenous contrast.  CONTRAST:  90m MULTIHANCE GADOBENATE DIMEGLUMINE 529 MG/ML IV SOLN  COMPARISON:  MRI of the cervical spine an MRI the brain 02/04/2014.  FINDINGS: Abnormal cord signal within the cervical spine extends to the level of T1. No additional cord signal abnormality is present through the remainder of the thoracic spine. The conus medullaris is at L1, within normal limits. Minimal endplate marrow changes are noted inferiorly at T8. Marrow signal, vertebral body heights, alignment are otherwise normal.  Shallow disc protrusions are present at T3-4, T4-5, T5-6, and T6-7. Slight disc bulging is present at T10-11. The foramina are patent bilaterally.  Postcontrast images demonstrate no pathologic enhancement within the thoracic spine.  IMPRESSION: 1. Abnormal cord signal within the cervical spine extends to the level of T1. 2. Remainder of the thoracic spine is within normal limits. 3. Minimal disc bulging at multiple levels as described. There is no focal stenosis.   Electronically Signed   By: CLawrence SantiagoM.D.   On: 02/05/2014 17:54    Scheduled Meds: . acyclovir  475 mg Intravenous 3 times per day  . cefTRIAXone (ROCEPHIN)  IV  2 g Intravenous Q12H  . heparin subcutaneous  5,000 Units Subcutaneous 3 times per day  . methylPREDNISolone (SOLU-MEDROL) injection  500 mg Intravenous Q12H  . pantoprazole  40 mg Oral Q0600  . potassium chloride  40 mEq Oral  BID  . vancomycin  1,000 mg Intravenous Q24H   Continuous Infusions:   Principal Problem:   Right arm weakness    Time spent: 40 minutes   [facility] Pager [phone] If 7PM-7AM, please contact night-coverage at [email or web address], password TS[facility]10/06/2013, 10:12 AM  LOS: 3 days

## 2014-02-08 LAB — VARICELLA-ZOSTER BY PCR: Varicella-Zoster, PCR: NOT DETECTED

## 2014-02-08 NOTE — Progress Notes (Signed)
Subjective: No changes  Exam: Filed Vitals:   02/08/14 0914  BP: 151/76  Pulse: 72  Temp: 98.3 F (36.8 C)  Resp: 18   Gen: In bed, NAD MS: awake, alert, interactive and appropriate WL:KHVFM, EOMI, face symmetric Motor: extension > flexion weakness of the RUE, mild right hip weakness, full on left.  Sensory:decreased mildly in right arm.   SSA neg SSB neg ACE neg HIV neg ESR 54, CRP nml nmo pending RF neg Anti jo , smith, ds dna - pending Lyme titers pending and   CSF: WBC 54 Protein 48 Glucose 88 VDRL(pending) Oligoclonal bands and IgG index - pending Enterovitrus PCR pending HSV pendgin CMV pending VZV pending [place] and IgM pending  ANA: 1:160, homogenous.    Impression: 63 yo F with transverse myelitis. Etiological workup proceeding.   Recommendations: 1) continue solumedrol (day 4/5) 2) f/u labs  Roland Rack, MD [facility] [phone]  If 7pm- 7am, please page neurology on call as listed in [place].

## 2014-02-08 NOTE — Progress Notes (Addendum)
[facility] PROGRESS NOTE  Taylor Wood ZNB:[id number] DOB: 1950-12-01 DOA: 02/04/2014 PCP: Leeanne Rio, PA-C  Assessment/Plan: Principal Problem:   Right arm weakness   Right arm weakness, RLE weakness secondary to transverse myelitis,  Remote RIGHT putaminal lacunar infarct. Minimal white matter changes  MRI of the cervical spine   MRI findings concerning for myelitis of unclear etiology. Differential diagnosis includes multiple sclerosis, and neuromyelitis optica, infectious myelitis, autoimmune disease, metabolic myelopathy.   Status post lumbar puncture Neurology workup as follows  SSA positive  SSB neg  ACE neg  HIV neg  ESR 54, CRP nml  nmo pending  RF neg  Anti jo , smith, ds dna - pending  Lyme titers pending and CSF:  WBC 54  Protein 48  Glucose 88  VDRL(pending)  Oligoclonal bands and IgG index - pending  Enterovitrus PCR pending  HSV negative CMV pending  [place] Igg and IgM pending     smith, Jo-1, Ds DNA, Scl-70 Ab-pending    3) CSF evaluation showed increased WBC in CSF with primary lymphocytosis, mildly elevated protein and glucose-review results with Dr. Debroah Loop indication for antibiotics at this time, discontinue all antibiotics  Pending oligoclonal bands, IgG index.   4) HIV nonreactive    5) MRI C-spine, T-spine -Abnormal cord signal within the cervical spine extends to the  level of T1.    6) continue Solumedrol 513m BID    PT/OT consult -recommend CIR     Prediabetes  Hemoglobin A1c 6.3 , CBg stable  Hypokalemia  Repleted    Code Status: full  Family Communication: family updated about patient's clinical progress  Disposition Plan: Patient declined CIR Plan is to go home in stable  Brief narrative:  Taylor Bloodgoodis an 63y.o. female With past history of viral meningitis one year ago. At that time she had a ANA titer of 1:80. Pateint has had no problems since then. Two weeks ago she noted she had right  shoulder pain. She went to urgent care where xray of shoulder was normal. Shoulder pain continued and spread to the whole right arm. One week later she noted her right leg was weaker and she ad more difficulty with walking and at time keeping her balance. At no time did she notice a decreased sensation in right leg or arm. She came to ED on this visit due to her right shoulder continued to have pain. MRI was obtained showing abnormal signal in the cervical spine and Neurology was consulted. She has received a one time dose of 10 mg Decadron while in the ED.  Consultants:  Neurology Procedures:  None Antibiotics:  None     HPI/Subjective: Still has some weakness with ambulation no other complaints  Objective: Filed Vitals:   02/07/14 2136 02/08/14 0125 02/08/14 0601 02/08/14 0914  BP: 143/57 166/76 156/66 151/76  Pulse: 75 73 64 72  Temp: 98.6 F (37 C) 98.4 F (36.9 C) 98.2 F (36.8 C) 98.3 F (36.8 C)  TempSrc: Oral Oral Oral Oral  Resp: 18 18 18 18   Height:      Weight:      SpO2: 96% 96% 98% 99%    Intake/Output Summary (Last 24 hours) at 02/08/14 1014 Last data filed at 02/08/14 0900  Gross per 24 hour  Intake    240 ml  Output      0 ml  Net    240 ml    Exam:  Gen: In bed, NAD  MS: awake, alert,  interactive and appropriate  SU:GAYGE, EOMI, face symmetric  Motor: extension > flexion weakness of the RUE, mild right hip weakness, full on left.  Sensory:decreased mildly in right arm.       Data Reviewed: Basic Metabolic Panel:  Recent Labs Lab 02/04/14 1825 02/06/14 1535 02/07/14 0522  NA 140 142 138  K 3.1* 4.0 4.5  CL 100 102 100  CO2 25 26 24   GLUCOSE 119* 180* 142*  BUN 10 16 14   CREATININE 0.40* 0.48* 0.42*  CALCIUM 9.2 9.6 9.6    Liver Function Tests:  Recent Labs Lab 02/06/14 1535  AST 18  ALT 13  ALKPHOS 72  BILITOT <0.2*  PROT 7.9  ALBUMIN 3.5   No results found for this basename: LIPASE, AMYLASE,  in the last 168 hours No  results found for this basename: AMMONIA,  in the last 168 hours  CBC:  Recent Labs Lab 02/04/14 1825 02/07/14 0522  WBC 4.8 7.2  NEUTROABS 2.5  --   HGB 11.3* 11.8*  HCT 33.4* 34.8*  MCV 90.0 90.4  PLT 280 324    Cardiac Enzymes: No results found for this basename: CKTOTAL, CKMB, CKMBINDEX, TROPONINI,  in the last 168 hours BNP (last 3 results) No results found for this basename: PROBNP,  in the last 8760 hours   CBG: No results found for this basename: GLUCAP,  in the last 168 hours  Recent Results (from the past 240 hour(s))  CSF CULTURE     Status: None   Collection Time    02/06/14  7:20 PM      Result Value Ref Range Status   Specimen Description CSF   Final   Special Requests NO 4 4CC   Final   Gram Stain     Final   Value: CYTSOSPIN SLIDE WBC PRESENT, PREDOMINANTLY MONONUCLEAR     NO ORGANISMS SEEN     Performed at [facility]     Performed at [facility]   Culture     Final   Value: NO GROWTH     Performed at [facility]   Report Status PENDING   Incomplete  GRAM STAIN     Status: None   Collection Time    02/06/14  7:20 PM      Result Value Ref Range Status   Specimen Description CSF   Final   Special Requests NONE   Final   Gram Stain     Final   Value: WBC PRESENT, PREDOMINANTLY MONONUCLEAR     NO ORGANISMS SEEN     CYTOSPIN SLIDE   Report Status 02/06/2014 FINAL   Final     Studies: Dg Shoulder Right  01/25/2014   CLINICAL DATA:  Right shoulder pain.  No trauma.  EXAM: RIGHT SHOULDER - 2+ VIEW  COMPARISON:  None.  FINDINGS: Normal anatomic alignment. No evidence for acute fracture or dislocation. No significant osseous degenerative change. Visualized right hemi thorax is unremarkable.  IMPRESSION: No acute osseous abnormality.   Electronically Signed   By: Lovey Newcomer M.D.   On: 01/25/2014 13:05   Ct Head Wo Contrast  02/04/2014   CLINICAL DATA:  Right-sided weakness.  EXAM: CT HEAD WITHOUT CONTRAST  CT CERVICAL SPINE  WITHOUT CONTRAST  TECHNIQUE: Multidetector CT imaging of the head and cervical spine was performed following the standard protocol without intravenous contrast. Multiplanar CT image reconstructions of the cervical spine were also generated.  COMPARISON:  CT scan of head of January 18, 2013.  FINDINGS: CT HEAD FINDINGS  Bony calvarium appears intact. No mass effect or midline shift is noted. Ventricular size is within normal limits. There is no evidence of mass lesion, hemorrhage or acute infarction.  CT CERVICAL SPINE FINDINGS  Mild levoscoliosis is noted. No fracture or spondylolisthesis is noted. Moderate degenerative disc disease is noted at C3-4, C4-5 and C5-6 with anterior osteophyte formation. Mild degenerative changes are seen involving the posterior facet joints.  IMPRESSION: Moderate multilevel degenerative disc disease is noted in the cervical spine. No acute abnormality seen in the cervical spine.  Normal head CT.   Electronically Signed   By: Sabino Dick M.D.   On: 02/04/2014 18:49   Ct Cervical Spine Wo Contrast  02/04/2014   CLINICAL DATA:  Right-sided weakness.  EXAM: CT HEAD WITHOUT CONTRAST  CT CERVICAL SPINE WITHOUT CONTRAST  TECHNIQUE: Multidetector CT imaging of the head and cervical spine was performed following the standard protocol without intravenous contrast. Multiplanar CT image reconstructions of the cervical spine were also generated.  COMPARISON:  CT scan of head of January 18, 2013.  FINDINGS: CT HEAD FINDINGS  Bony calvarium appears intact. No mass effect or midline shift is noted. Ventricular size is within normal limits. There is no evidence of mass lesion, hemorrhage or acute infarction.  CT CERVICAL SPINE FINDINGS  Mild levoscoliosis is noted. No fracture or spondylolisthesis is noted. Moderate degenerative disc disease is noted at C3-4, C4-5 and C5-6 with anterior osteophyte formation. Mild degenerative changes are seen involving the posterior facet joints.  IMPRESSION:  Moderate multilevel degenerative disc disease is noted in the cervical spine. No acute abnormality seen in the cervical spine.  Normal head CT.   Electronically Signed   By: Sabino Dick M.D.   On: 02/04/2014 18:49   Mr Jeri Cos ZO Contrast  02/05/2014   ADDENDUM REPORT: 02/05/2014 05:40  ADDENDUM: Partially imaged heterogeneous RIGHT thyroid lesion, predominately cystic measuring up to 19 mm for which thyroid sonogram is recommended on a nonemergent basis.   Electronically Signed   By: Elon Alas   On: 02/05/2014 05:40   02/05/2014   CLINICAL DATA:  RIGHT extremity weakness. History of seizure, headache, viral encephalitis and meningitis.  EXAM: MRI HEAD WITHOUT CONTRAST  MRI CERVICAL SPINE WITHOUT CONTRAST  TECHNIQUE: Multiplanar, multiecho pulse sequences of the brain and surrounding structures, and cervical spine, to include the craniocervical junction and cervicothoracic junction, were obtained without intravenous contrast. Patient did not wish to continue with postcontrast imaging.  COMPARISON:  CT of the head and cervical spine February 04, 2014  FINDINGS: MRI HEAD FINDINGS  The ventricles and sulci are normal. No suspicious parenchymal signal, mass lesions or mass of affect. Subcentimeter focus of bright T2 signal within the RIGHT putamen. A few scattered subcentimeter supratentorial white matter FLAIR T2 hyperintensities suggest sequelae of chronic small vessel ischemic disease, less than expected for age. No reduced diffusion to suggest acute ischemia. No susceptibility artifact to suggest hemorrhage.  No abnormal extra-axial fluid collection. No abnormal calvarial bone marrow signal. Ocular globes and orbital contents are unremarkable though not tailored for evaluation. Visualized paranasal sinuses and mastoid air cells appear well-aerated. No abnormal sellar expansion. Craniocervical junction is maintained. Minimal susceptibility artifact within the scalp.  MRI CERVICAL SPINE FINDINGS  Mild  motion degraded examination. The cervical spinal cord is expanded from approximate C2 through T1, measuring up to 10 mm in AP dimension, with focal T2 bright lesions within the RIGHT lateral spinal cord measuring up  to 3 mm at C3 through C5. The cord signal abnormality is predominately on the RIGHT. No syrinx. Craniocervical junction maintained.  Cervical vertebral bodies appear intact and aligned with maintenance of cervical lordosis. Moderate to severe C3-4 disc height loss, at least moderate at C4-5 and C5-6. Decreased T2 signal within all cervical discs most consistent with at least moderate desiccation. Moderate acute on chronic discogenic endplate change at J8-5, moderate chronic discogenic endplate changes U3-1 and C5-6. No suspicious STIR signal abnormality to suggest fracture.  Level by level evaluation:  C2-3: 2 mm central disc protrusion, no canal stenosis or neural foraminal narrowing.  C3-4: 3 mm broad-based disc bulge, uncovertebral hypertrophy and moderate LEFT facet arthropathy. No mild canal stenosis. Mild to moderate LEFT neural foraminal narrowing.  C4-5: 2 mm broad-based disc bulge, uncovertebral hypertrophy. Moderate LEFT and mild RIGHT facet arthropathy. Mild canal stenosis. Mild bilateral neural foraminal narrowing.  C5-6: 3 mm broad-based disc bulge, uncovertebral hypertrophy. Mild facet arthropathy. Mild canal stenosis. Mild RIGHT neural foraminal narrowing.  C6-7: 2 mm broad-based disc bulge, uncovertebral hypertrophy mild facet arthropathy. No canal stenosis or neural foraminal narrowing.  C7-T1: No disc bulge, canal stenosis nor neural foraminal narrowing.  IMPRESSION: MRI of the head: No acute intracranial process. Contrast enhanced sequences may be more sensitive for assessment of reported meningitis.  Remote RIGHT putaminal lacunar infarct. Minimal white matter changes suggest chronic small vessel ischemic disease.  MRI of the cervical spine: Abnormal expansile signal within the spinal  cord from C2 through approximately T1, predominately in the RIGHT associated with a 3 mm T2 bright cord lesion: Findings may reflect infectious or inflammatory/demyelinating process, metastasis or primary neoplasm is a consideration given the degree of edema. Recommend MRI of the cervical spine with contrast and correlation with CSF studies.  Cervical spondylosis. Mild canal stenosis C4-5 and C5-6. Neural foraminal narrowing C3-4 through C5-6: Mild to moderate on the LEFT at C3-4.  Electronically Signed: By: Elon Alas On: 02/05/2014 03:26   Mr Cervical Spine W Contrast  02/05/2014   CLINICAL DATA:  Right lower extremity weakness. History viral encephalitis an meningitis. Abnormal MRI of the cervical spine.  EXAM: MRI CERVICAL SPINE WITH CONTRAST  TECHNIQUE: Multiplanar and multiecho pulse sequences of the cervical spine, to include the craniocervical junction and cervicothoracic junction, were obtained according to standard protocol with intravenous contrast.  CONTRAST:  70m MULTIHANCE GADOBENATE DIMEGLUMINE 529 MG/ML IV SOLN  COMPARISON:  MRI of the cervical spine without contrast 02/04/2014.  FINDINGS: Asymmetric irregular enhancement is noted within the right side of the cord from C3-4 through C6-7, extending 3.7 cm. This is less than the total extent of T2 signal abnormality which extends to the level of T1. No other focal cord enhancement is present. There is no enhancement of the osseous spine. Disc disease is again noted.  IMPRESSION: Asymmetric right-sided enhancement of the spinal cord from C3-4 through C6-7. The finding is most compatible with transverse myelitis or ADEM. There is no discrete enhancement to suggest tumor. The precontrast pattern is atypical of multiple sclerosis.   Electronically Signed   By: CLawrence SantiagoM.D.   On: 02/05/2014 17:58   Mr Cervical Spine W Wo Contrast  02/05/2014   ADDENDUM REPORT: 02/05/2014 05:40  ADDENDUM: Partially imaged heterogeneous RIGHT thyroid  lesion, predominately cystic measuring up to 19 mm for which thyroid sonogram is recommended on a nonemergent basis.   Electronically Signed   By: CElon Alas  On: 02/05/2014 05:40   02/05/2014  CLINICAL DATA:  RIGHT extremity weakness. History of seizure, headache, viral encephalitis and meningitis.  EXAM: MRI HEAD WITHOUT CONTRAST  MRI CERVICAL SPINE WITHOUT CONTRAST  TECHNIQUE: Multiplanar, multiecho pulse sequences of the brain and surrounding structures, and cervical spine, to include the craniocervical junction and cervicothoracic junction, were obtained without intravenous contrast. Patient did not wish to continue with postcontrast imaging.  COMPARISON:  CT of the head and cervical spine February 04, 2014  FINDINGS: MRI HEAD FINDINGS  The ventricles and sulci are normal. No suspicious parenchymal signal, mass lesions or mass of affect. Subcentimeter focus of bright T2 signal within the RIGHT putamen. A few scattered subcentimeter supratentorial white matter FLAIR T2 hyperintensities suggest sequelae of chronic small vessel ischemic disease, less than expected for age. No reduced diffusion to suggest acute ischemia. No susceptibility artifact to suggest hemorrhage.  No abnormal extra-axial fluid collection. No abnormal calvarial bone marrow signal. Ocular globes and orbital contents are unremarkable though not tailored for evaluation. Visualized paranasal sinuses and mastoid air cells appear well-aerated. No abnormal sellar expansion. Craniocervical junction is maintained. Minimal susceptibility artifact within the scalp.  MRI CERVICAL SPINE FINDINGS  Mild motion degraded examination. The cervical spinal cord is expanded from approximate C2 through T1, measuring up to 10 mm in AP dimension, with focal T2 bright lesions within the RIGHT lateral spinal cord measuring up to 3 mm at C3 through C5. The cord signal abnormality is predominately on the RIGHT. No syrinx. Craniocervical junction maintained.   Cervical vertebral bodies appear intact and aligned with maintenance of cervical lordosis. Moderate to severe C3-4 disc height loss, at least moderate at C4-5 and C5-6. Decreased T2 signal within all cervical discs most consistent with at least moderate desiccation. Moderate acute on chronic discogenic endplate change at W2-9, moderate chronic discogenic endplate changes H3-7 and C5-6. No suspicious STIR signal abnormality to suggest fracture.  Level by level evaluation:  C2-3: 2 mm central disc protrusion, no canal stenosis or neural foraminal narrowing.  C3-4: 3 mm broad-based disc bulge, uncovertebral hypertrophy and moderate LEFT facet arthropathy. No mild canal stenosis. Mild to moderate LEFT neural foraminal narrowing.  C4-5: 2 mm broad-based disc bulge, uncovertebral hypertrophy. Moderate LEFT and mild RIGHT facet arthropathy. Mild canal stenosis. Mild bilateral neural foraminal narrowing.  C5-6: 3 mm broad-based disc bulge, uncovertebral hypertrophy. Mild facet arthropathy. Mild canal stenosis. Mild RIGHT neural foraminal narrowing.  C6-7: 2 mm broad-based disc bulge, uncovertebral hypertrophy mild facet arthropathy. No canal stenosis or neural foraminal narrowing.  C7-T1: No disc bulge, canal stenosis nor neural foraminal narrowing.  IMPRESSION: MRI of the head: No acute intracranial process. Contrast enhanced sequences may be more sensitive for assessment of reported meningitis.  Remote RIGHT putaminal lacunar infarct. Minimal white matter changes suggest chronic small vessel ischemic disease.  MRI of the cervical spine: Abnormal expansile signal within the spinal cord from C2 through approximately T1, predominately in the RIGHT associated with a 3 mm T2 bright cord lesion: Findings may reflect infectious or inflammatory/demyelinating process, metastasis or primary neoplasm is a consideration given the degree of edema. Recommend MRI of the cervical spine with contrast and correlation with CSF studies.   Cervical spondylosis. Mild canal stenosis C4-5 and C5-6. Neural foraminal narrowing C3-4 through C5-6: Mild to moderate on the LEFT at C3-4.  Electronically Signed: By: Elon Alas On: 02/05/2014 03:26   Mr Thoracic Spine W Wo Contrast  02/05/2014   CLINICAL DATA:  Right lower extremity weakness. History of viral encephalitis and meningitis.  EXAM: MRI THORACIC SPINE WITHOUT AND WITH CONTRAST  TECHNIQUE: Multiplanar and multiecho pulse sequences of the thoracic spine were obtained without and with intravenous contrast.  CONTRAST:  62m MULTIHANCE GADOBENATE DIMEGLUMINE 529 MG/ML IV SOLN  COMPARISON:  MRI of the cervical spine an MRI the brain 02/04/2014.  FINDINGS: Abnormal cord signal within the cervical spine extends to the level of T1. No additional cord signal abnormality is present through the remainder of the thoracic spine. The conus medullaris is at L1, within normal limits. Minimal endplate marrow changes are noted inferiorly at T8. Marrow signal, vertebral body heights, alignment are otherwise normal.  Shallow disc protrusions are present at T3-4, T4-5, T5-6, and T6-7. Slight disc bulging is present at T10-11. The foramina are patent bilaterally.  Postcontrast images demonstrate no pathologic enhancement within the thoracic spine.  IMPRESSION: 1. Abnormal cord signal within the cervical spine extends to the level of T1. 2. Remainder of the thoracic spine is within normal limits. 3. Minimal disc bulging at multiple levels as described. There is no focal stenosis.   Electronically Signed   By: CLawrence SantiagoM.D.   On: 02/05/2014 17:54    Scheduled Meds: . heparin subcutaneous  5,000 Units Subcutaneous 3 times per day  . methylPREDNISolone (SOLU-MEDROL) injection  500 mg Intravenous Q12H  . pantoprazole  40 mg Oral Q0600   Continuous Infusions:   Principal Problem:   Right arm weakness    Time spent: 40 minutes   [facility] Pager [phone] If 7PM-7AM, please  contact night-coverage at [email or web address], password [email or web address] 02/08/2014, 10:14 AM  LOS: 4 days

## 2014-02-09 MED ORDER — PANTOPRAZOLE SODIUM 40 MG PO TBEC: 40.0000 mg | DELAYED_RELEASE_TABLET | Freq: Every day | ORAL | Status: DC

## 2014-02-09 NOTE — Progress Notes (Signed)
Physical Therapy Treatment Patient Details Name: Taylor CoJulia Cuadras MRN: [id number] DOB: 09/12/1950 Today's Date: 02/09/2014    History of Present Illness 63 y.o. admitted with right arm weakness and leg weakness for 2 weeks with abnormal .MRI revealed remote RIGHT putaminal lacunar infarct. Abnormal cord signal within the cervical spine extends to the level of T1. Abnormal expansile signal within the spinal cord from C2 through approximately T1, predominately in the RIGHT associated with a 3 mm T2 bright cord lesion: Findings may reflect infectious or inflammatory/demyelinating process, metastasis or primary neoplasm is a consideration given the degree of edema Asymmetric right-sided enhancement of the spinal cord from C3-4 through C6-7. The finding is most compatible with transverse myelitis or ADEM. There is no discrete enhancement to suggest tumor. Pending lumbar puncture . Workup to r/o autoimmune disorder currently PMH:  Seizures, HA, viral encephalitis, viral meningitis     PT Comments    Continue to recommend CIR as best choice for pt post acute stay due to pt's decreased awareness/insight into deficits placing her at high fall risk. Family member in room with her during session. During stair instruction pt stating "someone will just pick me up, i don't need to learn this way". Education on fall risk, decr'd safety with that method for both parties.    Follow Up Recommendations  CIR     Equipment Recommendations  Rolling walker with 5" wheels;3in1 (PT)    Recommendations for Other Services Rehab consult     Precautions / Restrictions Precautions Precautions: Fall Restrictions Weight Bearing Restrictions: No    Mobility  Bed Mobility           Sit to supine: Modified independent (Device/Increase time)   General bed mobility comments: bed flat- did not assess supine to sit as pt was up with nursing on arrival to room.  Transfers Overall transfer level: Needs  assistance Equipment used: Rolling walker (2 wheeled) Transfers: Sit to/from Stand Sit to Stand: Supervision         General transfer comment: needs cues on right foot placement and hand placement for safety with transfers. demo's retropulsion with stands, however able to achieve standing with incr time.   Ambulation/Gait Ambulation/Gait assistance: Min assist Ambulation Distance (Feet): 80 Feet Assistive device: Rolling walker (2 wheeled) Gait Pattern/deviations: Step-to pattern;Ataxic;Shuffle;Narrow base of support;Decreased stance time - right;Decreased step length - left;Decreased dorsiflexion - right;Decreased weight shift to right Gait velocity: decreased Gait velocity interpretation: Below normal speed for age/gender General Gait Details: cues for sequence to keep right leg in front of her, otherwise pt tends to forget about her right leg and drag's it along. with cueing pt able to advance right leg without assistance. heavy UE use with advancement of left leg due to right leg instability and weaness.                             Stairs   Stairs assistance: Min assist Stair Management: No rails;Step to pattern;Backwards;With walker Number of Stairs: 3 General stair comments: pt does not have rails on home stairs so educated pt on use of walker, handout issued. min assist with cues for pt to safely demo stairs with walker.  Wheelchair Mobility    Modified Rankin (Stroke Patients Only)          Cognition Arousal/Alertness: Awake/alert Behavior During Therapy: WFL for tasks assessed/performed Overall Cognitive Status: Difficult to assess Area of Impairment: Safety/judgement  Safety/Judgement: Decreased awareness of deficits;Decreased awareness of safety     General Comments: Pt demo's decreased safety awareness, insight into deficits, however unsure if this is baseline behavior or not     Pertinent Vitals/Pain Pain Assessment: No/denies pain     PT  Goals (current goals can now be found in the care plan section) Acute Rehab PT Goals Patient Stated Goal: to get better and go home PT Goal Formulation: With patient Time For Goal Achievement: 02/12/14 Potential to Achieve Goals: Good Progress towards PT goals: Progressing toward goals    Frequency  Min 4X/week    PT Plan Current plan remains appropriate       End of Session Equipment Utilized During Treatment: Gait belt Activity Tolerance: Patient tolerated treatment well Patient left: in bed;with call bell/phone within reach;with family/visitor present;with bed alarm set     Time: 6468-0321 PT Time Calculation (min): 13 min  Charges:  $Gait Training: 8-22 mins                    G Codes:      Sallyanne Kuster 2014-02-12, 11:41 AM  Sallyanne Kuster, PTA Office- [phone]

## 2014-02-09 NOTE — Progress Notes (Signed)
Subjective: No changes  Exam: Filed Vitals:   02/09/14 0918  BP: 152/65  Pulse: 76  Temp: 98.9 F (37.2 C)  Resp: 18   Gen: In bed, NAD MS: awake, alert, interactive and appropriate BL:TJQZE, EOMI, face symmetric Motor: 4/5 extension of the right upper extremity of the RUE, mild right hip weakness, full on left.  Sensory:decreased mildly in right arm.   SSA neg SSB neg ACE neg HIV neg ESR 54, CRP nml nmo pending RF neg Anti jo , smith, ds dna - pending Lyme titers pending    CSF: WBC 54 Protein 48 Glucose 88 VDRL(pending) Oligoclonal bands and IgG index - pending Enterovitrus PCR pending HSV pendgin CMV pending VZV pending [place] Igg and IgM pending  ANA: 1:160, homogenous.    Impression: 63 yo F with transverse myelitis. Etiological workup proceeding. I have a very low suspicion for infectious etiology and she has markedly improved with steroids. Many of her labs will take some time to return and she does not need to wait for all of them.   Recommendations: 1) continue solumedrol (day 5/5) 2) can follow up as outpatient following finishing her course of solumedrol   Roland Rack, MD [facility] [phone]  If 7pm- 7am, please page neurology on call as listed in [place].

## 2014-02-09 NOTE — Progress Notes (Signed)
Patient ready for discharged, instructions were given and reviewed. Equipment delivered to patient, discharged accompanied by daughter.

## 2014-02-09 NOTE — Discharge Summary (Signed)
Physician Discharge Summary  Taylor Wood MRN: [id number] DOB/AGE: [birth year] 63 y.o.  PCP: Taylor Rio, PA-C   Admit date: 02/04/2014 Discharge date: 02/09/2014  Discharge Diagnoses:  Transverse myelitis   Right arm weakness     Medication List         pantoprazole 40 MG tablet  Commonly known as:  PROTONIX  Take 1 tablet (40 mg total) by mouth daily at 6 (six) AM.        Discharge Condition: Stable  Disposition: 01-Home or Self Care Patient declined CIR, wants to go home  Consults:  Neurology  Significant Diagnostic Studies: Dg Shoulder Right  01/25/2014   CLINICAL DATA:  Right shoulder pain.  No trauma.  EXAM: RIGHT SHOULDER - 2+ VIEW  COMPARISON:  None.  FINDINGS: Normal anatomic alignment. No evidence for acute fracture or dislocation. No significant osseous degenerative change. Visualized right hemi thorax is unremarkable.  IMPRESSION: No acute osseous abnormality.   Electronically Signed   By: Lovey Newcomer M.D.   On: 01/25/2014 13:05   Ct Head Wo Contrast  02/04/2014   CLINICAL DATA:  Right-sided weakness.  EXAM: CT HEAD WITHOUT CONTRAST  CT CERVICAL SPINE WITHOUT CONTRAST  TECHNIQUE: Multidetector CT imaging of the head and cervical spine was performed following the standard protocol without intravenous contrast. Multiplanar CT image reconstructions of the cervical spine were also generated.  COMPARISON:  CT scan of head of January 18, 2013.  FINDINGS: CT HEAD FINDINGS  Bony calvarium appears intact. No mass effect or midline shift is noted. Ventricular size is within normal limits. There is no evidence of mass lesion, hemorrhage or acute infarction.  CT CERVICAL SPINE FINDINGS  Mild levoscoliosis is noted. No fracture or spondylolisthesis is noted. Moderate degenerative disc disease is noted at C3-4, C4-5 and C5-6 with anterior osteophyte formation. Mild degenerative changes are seen involving the posterior facet joints.  IMPRESSION: Moderate multilevel  degenerative disc disease is noted in the cervical spine. No acute abnormality seen in the cervical spine.  Normal head CT.   Electronically Signed   By: Sabino Dick M.D.   On: 02/04/2014 18:49   Ct Cervical Spine Wo Contrast  02/04/2014   CLINICAL DATA:  Right-sided weakness.  EXAM: CT HEAD WITHOUT CONTRAST  CT CERVICAL SPINE WITHOUT CONTRAST  TECHNIQUE: Multidetector CT imaging of the head and cervical spine was performed following the standard protocol without intravenous contrast. Multiplanar CT image reconstructions of the cervical spine were also generated.  COMPARISON:  CT scan of head of January 18, 2013.  FINDINGS: CT HEAD FINDINGS  Bony calvarium appears intact. No mass effect or midline shift is noted. Ventricular size is within normal limits. There is no evidence of mass lesion, hemorrhage or acute infarction.  CT CERVICAL SPINE FINDINGS  Mild levoscoliosis is noted. No fracture or spondylolisthesis is noted. Moderate degenerative disc disease is noted at C3-4, C4-5 and C5-6 with anterior osteophyte formation. Mild degenerative changes are seen involving the posterior facet joints.  IMPRESSION: Moderate multilevel degenerative disc disease is noted in the cervical spine. No acute abnormality seen in the cervical spine.  Normal head CT.   Electronically Signed   By: Sabino Dick M.D.   On: 02/04/2014 18:49   Mr Jeri Cos JY Contrast  02/05/2014   ADDENDUM REPORT: 02/05/2014 05:40  ADDENDUM: Partially imaged heterogeneous RIGHT thyroid lesion, predominately cystic measuring up to 19 mm for which thyroid sonogram is recommended on a nonemergent basis.   Electronically Signed  By: Elon Alas   On: 02/05/2014 05:40   02/05/2014   CLINICAL DATA:  RIGHT extremity weakness. History of seizure, headache, viral encephalitis and meningitis.  EXAM: MRI HEAD WITHOUT CONTRAST  MRI CERVICAL SPINE WITHOUT CONTRAST  TECHNIQUE: Multiplanar, multiecho pulse sequences of the brain and surrounding structures,  and cervical spine, to include the craniocervical junction and cervicothoracic junction, were obtained without intravenous contrast. Patient did not wish to continue with postcontrast imaging.  COMPARISON:  CT of the head and cervical spine February 04, 2014  FINDINGS: MRI HEAD FINDINGS  The ventricles and sulci are normal. No suspicious parenchymal signal, mass lesions or mass of affect. Subcentimeter focus of bright T2 signal within the RIGHT putamen. A few scattered subcentimeter supratentorial white matter FLAIR T2 hyperintensities suggest sequelae of chronic small vessel ischemic disease, less than expected for age. No reduced diffusion to suggest acute ischemia. No susceptibility artifact to suggest hemorrhage.  No abnormal extra-axial fluid collection. No abnormal calvarial bone marrow signal. Ocular globes and orbital contents are unremarkable though not tailored for evaluation. Visualized paranasal sinuses and mastoid air cells appear well-aerated. No abnormal sellar expansion. Craniocervical junction is maintained. Minimal susceptibility artifact within the scalp.  MRI CERVICAL SPINE FINDINGS  Mild motion degraded examination. The cervical spinal cord is expanded from approximate C2 through T1, measuring up to 10 mm in AP dimension, with focal T2 bright lesions within the RIGHT lateral spinal cord measuring up to 3 mm at C3 through C5. The cord signal abnormality is predominately on the RIGHT. No syrinx. Craniocervical junction maintained.  Cervical vertebral bodies appear intact and aligned with maintenance of cervical lordosis. Moderate to severe C3-4 disc height loss, at least moderate at C4-5 and C5-6. Decreased T2 signal within all cervical discs most consistent with at least moderate desiccation. Moderate acute on chronic discogenic endplate change at C7-8, moderate chronic discogenic endplate changes L3-8 and C5-6. No suspicious STIR signal abnormality to suggest fracture.  Level by level  evaluation:  C2-3: 2 mm central disc protrusion, no canal stenosis or neural foraminal narrowing.  C3-4: 3 mm broad-based disc bulge, uncovertebral hypertrophy and moderate LEFT facet arthropathy. No mild canal stenosis. Mild to moderate LEFT neural foraminal narrowing.  C4-5: 2 mm broad-based disc bulge, uncovertebral hypertrophy. Moderate LEFT and mild RIGHT facet arthropathy. Mild canal stenosis. Mild bilateral neural foraminal narrowing.  C5-6: 3 mm broad-based disc bulge, uncovertebral hypertrophy. Mild facet arthropathy. Mild canal stenosis. Mild RIGHT neural foraminal narrowing.  C6-7: 2 mm broad-based disc bulge, uncovertebral hypertrophy mild facet arthropathy. No canal stenosis or neural foraminal narrowing.  C7-T1: No disc bulge, canal stenosis nor neural foraminal narrowing.  IMPRESSION: MRI of the head: No acute intracranial process. Contrast enhanced sequences may be more sensitive for assessment of reported meningitis.  Remote RIGHT putaminal lacunar infarct. Minimal white matter changes suggest chronic small vessel ischemic disease.  MRI of the cervical spine: Abnormal expansile signal within the spinal cord from C2 through approximately T1, predominately in the RIGHT associated with a 3 mm T2 bright cord lesion: Findings may reflect infectious or inflammatory/demyelinating process, metastasis or primary neoplasm is a consideration given the degree of edema. Recommend MRI of the cervical spine with contrast and correlation with CSF studies.  Cervical spondylosis. Mild canal stenosis C4-5 and C5-6. Neural foraminal narrowing C3-4 through C5-6: Mild to moderate on the LEFT at C3-4.  Electronically Signed: By: Elon Alas On: 02/05/2014 03:26   Mr Cervical Spine W Contrast  02/05/2014   CLINICAL  DATA:  Right lower extremity weakness. History viral encephalitis an meningitis. Abnormal MRI of the cervical spine.  EXAM: MRI CERVICAL SPINE WITH CONTRAST  TECHNIQUE: Multiplanar and multiecho pulse  sequences of the cervical spine, to include the craniocervical junction and cervicothoracic junction, were obtained according to standard protocol with intravenous contrast.  CONTRAST:  56m MULTIHANCE GADOBENATE DIMEGLUMINE 529 MG/ML IV SOLN  COMPARISON:  MRI of the cervical spine without contrast 02/04/2014.  FINDINGS: Asymmetric irregular enhancement is noted within the right side of the cord from C3-4 through C6-7, extending 3.7 cm. This is less than the total extent of T2 signal abnormality which extends to the level of T1. No other focal cord enhancement is present. There is no enhancement of the osseous spine. Disc disease is again noted.  IMPRESSION: Asymmetric right-sided enhancement of the spinal cord from C3-4 through C6-7. The finding is most compatible with transverse myelitis or ADEM. There is no discrete enhancement to suggest tumor. The precontrast pattern is atypical of multiple sclerosis.   Electronically Signed   By: CLawrence SantiagoM.D.   On: 02/05/2014 17:58   Mr Cervical Spine W Wo Contrast  02/05/2014   ADDENDUM REPORT: 02/05/2014 05:40  ADDENDUM: Partially imaged heterogeneous RIGHT thyroid lesion, predominately cystic measuring up to 19 mm for which thyroid sonogram is recommended on a nonemergent basis.   Electronically Signed   By: CElon Alas  On: 02/05/2014 05:40   02/05/2014   CLINICAL DATA:  RIGHT extremity weakness. History of seizure, headache, viral encephalitis and meningitis.  EXAM: MRI HEAD WITHOUT CONTRAST  MRI CERVICAL SPINE WITHOUT CONTRAST  TECHNIQUE: Multiplanar, multiecho pulse sequences of the brain and surrounding structures, and cervical spine, to include the craniocervical junction and cervicothoracic junction, were obtained without intravenous contrast. Patient did not wish to continue with postcontrast imaging.  COMPARISON:  CT of the head and cervical spine February 04, 2014  FINDINGS: MRI HEAD FINDINGS  The ventricles and sulci are normal. No suspicious  parenchymal signal, mass lesions or mass of affect. Subcentimeter focus of bright T2 signal within the RIGHT putamen. A few scattered subcentimeter supratentorial white matter FLAIR T2 hyperintensities suggest sequelae of chronic small vessel ischemic disease, less than expected for age. No reduced diffusion to suggest acute ischemia. No susceptibility artifact to suggest hemorrhage.  No abnormal extra-axial fluid collection. No abnormal calvarial bone marrow signal. Ocular globes and orbital contents are unremarkable though not tailored for evaluation. Visualized paranasal sinuses and mastoid air cells appear well-aerated. No abnormal sellar expansion. Craniocervical junction is maintained. Minimal susceptibility artifact within the scalp.  MRI CERVICAL SPINE FINDINGS  Mild motion degraded examination. The cervical spinal cord is expanded from approximate C2 through T1, measuring up to 10 mm in AP dimension, with focal T2 bright lesions within the RIGHT lateral spinal cord measuring up to 3 mm at C3 through C5. The cord signal abnormality is predominately on the RIGHT. No syrinx. Craniocervical junction maintained.  Cervical vertebral bodies appear intact and aligned with maintenance of cervical lordosis. Moderate to severe C3-4 disc height loss, at least moderate at C4-5 and C5-6. Decreased T2 signal within all cervical discs most consistent with at least moderate desiccation. Moderate acute on chronic discogenic endplate change at CK8-0 moderate chronic discogenic endplate changes CK3-4and C5-6. No suspicious STIR signal abnormality to suggest fracture.  Level by level evaluation:  C2-3: 2 mm central disc protrusion, no canal stenosis or neural foraminal narrowing.  C3-4: 3 mm broad-based disc bulge, uncovertebral hypertrophy and  moderate LEFT facet arthropathy. No mild canal stenosis. Mild to moderate LEFT neural foraminal narrowing.  C4-5: 2 mm broad-based disc bulge, uncovertebral hypertrophy. Moderate LEFT  and mild RIGHT facet arthropathy. Mild canal stenosis. Mild bilateral neural foraminal narrowing.  C5-6: 3 mm broad-based disc bulge, uncovertebral hypertrophy. Mild facet arthropathy. Mild canal stenosis. Mild RIGHT neural foraminal narrowing.  C6-7: 2 mm broad-based disc bulge, uncovertebral hypertrophy mild facet arthropathy. No canal stenosis or neural foraminal narrowing.  C7-T1: No disc bulge, canal stenosis nor neural foraminal narrowing.  IMPRESSION: MRI of the head: No acute intracranial process. Contrast enhanced sequences may be more sensitive for assessment of reported meningitis.  Remote RIGHT putaminal lacunar infarct. Minimal white matter changes suggest chronic small vessel ischemic disease.  MRI of the cervical spine: Abnormal expansile signal within the spinal cord from C2 through approximately T1, predominately in the RIGHT associated with a 3 mm T2 bright cord lesion: Findings may reflect infectious or inflammatory/demyelinating process, metastasis or primary neoplasm is a consideration given the degree of edema. Recommend MRI of the cervical spine with contrast and correlation with CSF studies.  Cervical spondylosis. Mild canal stenosis C4-5 and C5-6. Neural foraminal narrowing C3-4 through C5-6: Mild to moderate on the LEFT at C3-4.  Electronically Signed: By: Elon Alas On: 02/05/2014 03:26   Mr Thoracic Spine W Wo Contrast  02/05/2014   CLINICAL DATA:  Right lower extremity weakness. History of viral encephalitis and meningitis.  EXAM: MRI THORACIC SPINE WITHOUT AND WITH CONTRAST  TECHNIQUE: Multiplanar and multiecho pulse sequences of the thoracic spine were obtained without and with intravenous contrast.  CONTRAST:  104m MULTIHANCE GADOBENATE DIMEGLUMINE 529 MG/ML IV SOLN  COMPARISON:  MRI of the cervical spine an MRI the brain 02/04/2014.  FINDINGS: Abnormal cord signal within the cervical spine extends to the level of T1. No additional cord signal abnormality is present through  the remainder of the thoracic spine. The conus medullaris is at L1, within normal limits. Minimal endplate marrow changes are noted inferiorly at T8. Marrow signal, vertebral body heights, alignment are otherwise normal.  Shallow disc protrusions are present at T3-4, T4-5, T5-6, and T6-7. Slight disc bulging is present at T10-11. The foramina are patent bilaterally.  Postcontrast images demonstrate no pathologic enhancement within the thoracic spine.  IMPRESSION: 1. Abnormal cord signal within the cervical spine extends to the level of T1. 2. Remainder of the thoracic spine is within normal limits. 3. Minimal disc bulging at multiple levels as described. There is no focal stenosis.   Electronically Signed   By: CLawrence SantiagoM.D.   On: 02/05/2014 17:54      Microbiology: Recent Results (from the past 240 hour(s))  CSF CULTURE     Status: None   Collection Time    02/06/14  7:20 PM      Result Value Ref Range Status   Specimen Description CSF   Final   Special Requests NO 4 4CC   Final   Gram Stain     Final   Value: CYTSOSPIN SLIDE WBC PRESENT, PREDOMINANTLY MONONUCLEAR     NO ORGANISMS SEEN     [facility]    [facility]  Culture     Final   Value: NO GROWTH 2 DAYS     [facility]formed at [facility]  Report Status PENDING   Incomplete  GRAM STAIN     Status: None   Collection Time  02/06/14  7:20 PM      Result Value Ref Range Status   Specimen Description CSF   Final   Special Requests NONE   Final   Gram Stain     Final   Value: WBC PRESENT, PREDOMINANTLY MONONUCLEAR     NO ORGANISMS SEEN     CYTOSPIN SLIDE   Report Status 02/06/2014 FINAL   Final     Labs: No results found for this or any previous visit (from the past 48 hour(s)).   HPI  Taylor Wood is a 63 y.o. female with history of viral meningitis, seizures, two week history of right shoulder pain progressing to RUE pain as well as RLE weakness with difficulty walking  for the past week. She was evaluated in ED for shoulder pain and admitted on 02/04/14 due to abnormal xrays. MRI brain with remote right putaminal infarct and MRI cervical/ thoracic spine with expansile signal in C2-T1 predominately in right associated with T2 bright cord signal with question infectious, inflammatory, metastases or primary neoplasm. She was started on solumedrol 500 mg bid and neurology following for work up. ANA positive. LP performed with results as follows,PT evaluation done revealing difficulty with mobility with RLE weakness. CIR recommended for follow up therapy, but patient declined.     HOSPITAL COURSE:  Right arm weakness, RLE weakness secondary to transverse myelitis,  Evaluated by neurology MRI of the brain showed:Remote RIGHT putaminal lacunar infarct. Minimal white matter changes  MRI of the cervical spine: Findings may  reflect infectious or inflammatory/demyelinating process, metastasis  or primary neoplasm is a consideration given the degree of edema.  MRI findings concerning for myelitis of unclear etiology. Differential diagnosis includes multiple sclerosis, and neuromyelitis optica, infectious myelitis, autoimmune disease, metabolic myelopathy.  Following workup was ordered by neurology SSA neg  SSB neg  ACE neg  HIV neg  ESR 54, CRP nml  nmo pending  smith, Jo-1, Ds DNA, Scl-70 Ab-pending  West [place] virus antibody pending  3) lumbar puncture was performed and CSF evaluation showed increased WBC in CSF with primary lymphocytosis, mildly elevated protein and glucose-patient empirically started on acyclovir, vancomycin, Rocephin for suspected meningitis but antibiotics discontinued because of low suspicion for meningitis  Pending oligoclonal bands, IgG index.   4)CSF PCR for HSV-1, HSV-2 negative, HHV-6, VZV negative, CMV negative, EBV, enteroviruses, preliminary CSF fluid cultures negative   4) HIV nonreactive   5) MRI C-spine, T-spine -Abnormal cord  signal within the cervical spine extends to the  level of T1.   6) status post treatment with Solumedrol 570m BID for 5 days Patient recommended to follow up with neurology  PT/OT consult -recommend CIR , patient declined, home health has been set up    Prediabetes  Hemoglobin A1c 6.3 , CBg stable   Hypokalemia  Repleted    Discharge Exam:  Blood pressure 152/65, pulse 76, temperature 98.9 F (37.2 C), temperature source Oral, resp. rate 18, height 5' (1.524 m), weight 47.628 kg (105 lb), SpO2 100.00%. Constitutional: She appears well-developed and well-nourished.  HENT:  Head: Normocephalic and atraumatic.  Cardiovascular: Normal rate.  Respiratory: Effort normal.  GI: She exhibits no distension.  Neurological:  RUE: grossly 3- to 3+/5. RLE: 2-3/5 but inconsistent. Senses pain and light touch. LUE and LLE appear normal. Fair insight and awareness.            Follow-up Information   Follow up with MLeeanne Rio PA-C. Schedule an appointment as soon as possible for a visit  in 1 week.   Specialty:  Physician Assistant   Contact information:   [address] [place] [address] [phone]       Call Neurology. (For transverse myelitis)    Contact information:   [facility]  [address]  [place], [place] [address]  Tel: [phone]  Fax: [phone]       Signed: Reyne Dumas 02/09/2014, 10:25 AM

## 2014-02-09 NOTE — Progress Notes (Signed)
Occupational Therapy Treatment Patient Details Name: Taylor Wood MRN: [id number] DOB: 06/19/1950 Today's Date: 02/09/2014    History of present illness 63 y.o. admitted with right arm weakness and leg weakness for 2 weeks with abnormal .MRI revealed remote RIGHT putaminal lacunar infarct. Abnormal cord signal within the cervical spine extends to the level of T1. Abnormal expansile signal within the spinal cord from C2 through approximately T1, predominately in the RIGHT associated with a 3 mm T2 bright cord lesion: Findings may reflect infectious or inflammatory/demyelinating process, metastasis or primary neoplasm is a consideration given the degree of edema Asymmetric right-sided enhancement of the spinal cord from C3-4 through C6-7. The finding is most compatible with transverse myelitis or ADEM. There is no discrete enhancement to suggest tumor. Pending lumbar puncture . Workup to r/o autoimmune disorder currently PMH:  Seizures, HA, viral encephalitis, viral meningitis    OT comments  Pt seen today for family education, therapeutic exercise, and ADL training. Pt continues to demonstrate decreased safety awareness, however is declining CIR at this time. As a result, pt would benefit from [facility] to address home safety with functional mobility and ADLs, as well as UE strength. Pt reports that UE strength is returning and now has full AROM, although she continues to move quickly through ROM exercises and required supervision for slow and sustained contractions.    Follow Up Recommendations  Home health OT (pt declining CIR at this time)    Equipment Recommendations  Tub/shower seat    Recommendations for Other Services      Precautions / Restrictions Precautions Precautions: Fall Restrictions Weight Bearing Restrictions: No       Mobility Bed Mobility           Sit to supine: Modified independent (Device/Increase time)   General bed mobility comments: bed flat- did not assess supine  to sit as pt was up with nursing on arrival to room.  Transfers Overall transfer level: Needs assistance Equipment used: Rolling walker (2 wheeled) Transfers: Sit to/from Stand Sit to Stand: Supervision         General transfer comment: VC's for RLE placement and awareness.         ADL Overall ADL's : Needs assistance/impaired     Grooming: Wash/dry hands;Supervision/safety;Standing;Set up   Upper Body Bathing: Set up;Supervision/ safety;Sitting               Toilet Transfer: Supervision/safety;Ambulation;RW Toilet Transfer Details (indicate cue type and reason): VC's for RLE Toileting- Clothing Manipulation and Hygiene: Supervision/safety;Sit to/from stand       Functional mobility during ADLs: Supervision/safety;Rolling walker General ADL Comments: Pt progressing with functional use of RUE. Full AROM, however unable to performed sustained antigravity hold due to weakness. Pt continues to present with decreased safety awareness and emphasized need for Supervision for OOB/mobility upon return home.                 Cognition  Arousal/Alertness: Awake/Alert Behavior During Therapy: WFL for tasks assessed/performed Overall Cognitive Status: Impaired/Different from baseline Area of Impairment: Safety/judgement          Safety/Judgement: Decreased awareness of deficits;Decreased awareness of safety     General Comments: Pt demo's decreased safety awareness, insight into deficits, however unsure if this is baseline behavior or not      Exercises Other Exercises Other Exercises: performed theraputty exercises with HEP. Had pt teach OT the exercises for improved recall. Pt making progress with RUE and has full shoulder flexion, but continues to move quickly.  Pt able to sustain forward flexion for 20+ seconds. Pt has difficulty with in-hand manipulation and encouraged to practice through functional activities (theraputty, picking up small items one at a time, using  pencil and erasing). Pt performed prayer stretch for full extension of composite digits. Pt has slight flexion at MCP/PIP and difficulty with full AROM extension.           Pertinent Vitals/ Pain       Pain Assessment: No/denies pain         Frequency Min 3X/week     Progress Toward Goals  OT Goals(current goals can now be found in the care plan section)  Progress towards OT goals: Progressing toward goals  Acute Rehab OT Goals Patient Stated Goal: to get better and go home OT Goal Formulation: With patient Time For Goal Achievement: 02/12/14 Potential to Achieve Goals: Good ADL Goals Pt Will Perform Grooming: with set-up;standing Pt Will Perform Upper Body Dressing: with set-up;sitting Pt Will Perform Lower Body Dressing: with set-up;sit to/from stand Pt Will Transfer to Toilet: with modified independence;ambulating Additional ADL Goal #1: Pt will independently perform HEP for RUE to increase strength and coordination.  Plan Discharge plan needs to be updated       End of Session Equipment Utilized During Treatment: Other (comment);Rolling walker (theraputty)   Activity Tolerance Patient tolerated treatment well   Patient Left in bed;with call bell/phone within reach;with bed alarm set   Nurse Communication Other (comment) (discharge recommendation updates)        Time: [id number] OT Time Calculation (min): 23 min  Charges: OT General Charges $OT Visit: 1 Procedure OT Treatments $Self Care/Home Management : 8-22 mins $Therapeutic Exercise: 8-22 mins  Nena JordanMiller, Brentlee Sciara M 02/09/2014, 4:24 PM  Carney LivingLeeAnn Marie Rutilio Yellowhair, OTR/L Occupational Therapist [phone] (pager)

## 2014-02-09 NOTE — Care Management Note (Signed)
Page 1 of 2   02/09/2014     4:07:45 PM CARE MANAGEMENT NOTE 02/09/2014  Patient:  Wayne,Evianna   Account Number:  [id number]  Date Initiated:  02/06/2014  Documentation initiated by:  Lorne Skeens  Subjective/Objective Assessment:   Patient was admitted with right arm weakness and leg weakness for 2 weeks with abnormal MRI of C spine. Admitted from home.     Action/Plan:   Will follow for discharge needs pending PT/OT evals and physician orders.   Anticipated DC Date:  02/09/2014   Anticipated DC Plan:  [place]  CM consult      [facility]  HOME HEALTH   Choice offered to / List presented to:  C-1 Patient   DME arranged  3-N-1  Vassie Moselle      DME agency  Alfred arranged  HH-1 RN      [place].   Status of service:  Completed, signed off Medicare Important Message given?   (If response is "NO", the following Medicare IM given date fields will be blank) Date Medicare IM given:   Medicare IM given by:   Date Additional Medicare IM given:   Additional Medicare IM given by:    Discharge Disposition:  [place]  Per UR Regulation:  Reviewed for med. necessity/level of care/duration of stay  If discussed at [place] of Stay Meetings, dates discussed:    Comments:  02/09/14 16:00 CM met with pt in room to offer choice of home health agency.  Pt chooses [facility] to render [facility] service. Address and contact information verified with pt.  CM called DME rep, Jeneen Rinks to deliver 3n1 and RW to room prior to discharge.  Referral called to [facility] rep, Jamie.  No other CM needs were communicated.  Mariane Masters, BSn, [place].

## 2014-02-10 ENCOUNTER — Telehealth: Payer: Self-pay | Admitting: Family

## 2014-02-10 LAB — ANTI-DNA ANTIBODY, DOUBLE-STRANDED: ds DNA Ab: 2 IU/mL

## 2014-02-10 LAB — ANTI-SCLERODERMA ANTIBODY: Scleroderma (Scl-70) (ENA) Antibody, IgG: <1

## 2014-02-10 LAB — ANTI-SMITH ANTIBODY: ENA SM Ab Ser-aCnc: <1

## 2014-02-10 LAB — JO-1 ANTIBODY-IGG: Jo-1 Antibody, IgG: <1

## 2014-02-10 LAB — HIV-1 RNA, QUALITATIVE, TMA: HIV-1 RNA, Qualitative, TMA: NOT DETECTED

## 2014-02-10 NOTE — Telephone Encounter (Signed)
Left message for patient to return my call.

## 2014-02-10 NOTE — Telephone Encounter (Signed)
Left a message for call back.  Need to complete tcm call.

## 2014-02-10 NOTE — Telephone Encounter (Signed)
Please schedule a follow up (hospital) within 1 week with [facility].

## 2014-02-11 NOTE — Telephone Encounter (Signed)
Appointment scheduled.

## 2014-02-12 LAB — OLIGOCLONAL BANDS, CSF + SERM

## 2014-02-13 LAB — NEUROMYELITIS OPTICA AUTOAB, IGG: NMO-IGG: POSITIVE

## 2014-02-19 ENCOUNTER — Ambulatory Visit (INDEPENDENT_AMBULATORY_CARE_PROVIDER_SITE_OTHER): Payer: BC Managed Care – PPO | Admitting: Physician Assistant

## 2014-02-19 ENCOUNTER — Encounter: Payer: Self-pay | Admitting: Physician Assistant

## 2014-02-19 VITALS — BP 140/78 | HR 92 | Temp 98.2°F | Wt 105.0 lb

## 2014-02-19 DIAGNOSIS — G0489 Other myelitis: Secondary | ICD-10-CM

## 2014-02-19 DIAGNOSIS — G373 Acute transverse myelitis in demyelinating disease of central nervous system: Secondary | ICD-10-CM

## 2014-02-19 NOTE — Progress Notes (Signed)
Pre visit review using our clinic review tool, if applicable. No additional management support is needed unless otherwise documented below in the visit note. 

## 2014-02-19 NOTE — Patient Instructions (Signed)
Please follow-up with Dr. Everlena Cooper as scheduled next Thursday. Please stay well hydrated.  Limit salt intake as your BP is a little high today.  Read the DASH diet instructions below.  STop by the lab today for blood work. I will call you with your results. Follow-up with me in 1 month.  In the meantime, I am going to call [facility] to assess the status of home Physical Therapy.  Please drop off FMLA paperwork and I will complete it.  If you develop a recurrence of symptoms, please proceed directly to the ER.  DASH Eating Plan DASH stands for "Dietary Approaches to Stop Hypertension." The DASH eating plan is a healthy eating plan that has been shown to reduce high blood pressure (hypertension). Additional health benefits may include reducing the risk of type 2 diabetes mellitus, heart disease, and stroke. The DASH eating plan may also help with weight loss. WHAT DO I NEED TO KNOW ABOUT THE DASH EATING PLAN? For the DASH eating plan, you will follow these general guidelines:  Choose foods with a percent daily value for sodium of less than 5% (as listed on the food label).  Use salt-free seasonings or herbs instead of table salt or sea salt.  Check with your health care provider or pharmacist before using salt substitutes.  Eat lower-sodium products, often labeled as "lower sodium" or "no salt added."  Eat fresh foods.  Eat more vegetables, fruits, and low-fat dairy products.  Choose whole grains. Look for the word "whole" as the first word in the ingredient list.  Choose fish and skinless chicken or [place] more often than red meat. Limit fish, poultry, and meat to 6 oz (170 g) each day.  Limit sweets, desserts, sugars, and sugary drinks.  Choose heart-healthy fats.  Limit cheese to 1 oz (28 g) per day.  Eat more home-cooked food and less restaurant, buffet, and fast food.  Limit fried foods.  Cook foods using methods other than frying.  Limit canned vegetables. If you do  use them, rinse them well to decrease the sodium.  When eating at a restaurant, ask that your food be prepared with less salt, or no salt if possible. WHAT FOODS CAN I EAT? Seek help from a dietitian for individual calorie needs. Grains Whole grain or whole wheat bread. Brown rice. Whole grain or whole wheat pasta. Quinoa, bulgur, and whole grain cereals. Low-sodium cereals. Corn or whole wheat flour tortillas. Whole grain cornbread. Whole grain crackers. Low-sodium crackers. Vegetables Fresh or frozen vegetables (raw, steamed, roasted, or grilled). Low-sodium or reduced-sodium tomato and vegetable juices. Low-sodium or reduced-sodium tomato sauce and paste. Low-sodium or reduced-sodium canned vegetables.  Fruits All fresh, canned (in natural juice), or frozen fruits. Meat and Other Protein Products Ground beef (85% or leaner), grass-fed beef, or beef trimmed of fat. Skinless chicken or [place]. Ground chicken or [place]. Pork trimmed of fat. All fish and seafood. Eggs. Dried beans, peas, or lentils. Unsalted nuts and seeds. Unsalted canned beans. Dairy Low-fat dairy products, such as skim or 1% milk, 2% or reduced-fat cheeses, low-fat ricotta or cottage cheese, or plain low-fat yogurt. Low-sodium or reduced-sodium cheeses. Fats and Oils Tub margarines without trans fats. Light or reduced-fat mayonnaise and salad dressings (reduced sodium). Avocado. Safflower, olive, or canola oils. Natural peanut or almond butter. Other Unsalted popcorn and pretzels. The items listed above may not be a complete list of recommended foods or beverages. Contact your dietitian for more options. WHAT FOODS ARE NOT RECOMMENDED? Grains  White bread. White pasta. White rice. Refined cornbread. Bagels and croissants. Crackers that contain trans fat. Vegetables Creamed or fried vegetables. Vegetables in a cheese sauce. Regular canned vegetables. Regular canned tomato sauce and paste. Regular tomato and vegetable  juices. Fruits Dried fruits. Canned fruit in light or heavy syrup. Fruit juice. Meat and Other Protein Products Fatty cuts of meat. Ribs, chicken wings, bacon, sausage, bologna, salami, chitterlings, fatback, hot dogs, bratwurst, and packaged luncheon meats. Salted nuts and seeds. Canned beans with salt. Dairy Whole or 2% milk, cream, half-and-half, and cream cheese. Whole-fat or sweetened yogurt. Full-fat cheeses or blue cheese. Nondairy creamers and whipped toppings. Processed cheese, cheese spreads, or cheese curds. Condiments Onion and garlic salt, seasoned salt, table salt, and sea salt. Canned and packaged gravies. Worcestershire sauce. Tartar sauce. Barbecue sauce. Teriyaki sauce. Soy sauce, including reduced sodium. Steak sauce. Fish sauce. Oyster sauce. Cocktail sauce. Horseradish. Ketchup and mustard. Meat flavorings and tenderizers. Bouillon cubes. Hot sauce. Tabasco sauce. Marinades. Taco seasonings. Relishes. Fats and Oils Butter, stick margarine, lard, shortening, ghee, and bacon fat. Coconut, palm kernel, or palm oils. Regular salad dressings. Other Pickles and olives. Salted popcorn and pretzels. The items listed above may not be a complete list of foods and beverages to avoid. Contact your dietitian for more information. WHERE CAN I FIND MORE INFORMATION? National [employer], [employer], and [employer] Institute: travelstabloid.com Document Released: 04/14/2011 Document Revised: 09/09/2013 Document Reviewed: 02/27/2013 [facility] Patient Information 2015 [place], [place]. This information is not intended to replace advice given to you by your health care provider. Make sure you discuss any questions you have with your health care provider.

## 2014-02-20 LAB — BASIC METABOLIC PANEL
BUN: 17 mg/dL (ref 6–23)
CALCIUM: 9.6 mg/dL (ref 8.4–10.5)
CO2: 23 mEq/L (ref 19–32)
Chloride: 102 mEq/L (ref 96–112)
Creatinine, Ser: 0.5 mg/dL (ref 0.4–1.2)
GFR: 177.59 mL/min (ref >60.00)
GLUCOSE: 94 mg/dL (ref 70–99)
POTASSIUM: 4.1 meq/L (ref 3.5–5.1)
Sodium: 137 mEq/L (ref 135–145)

## 2014-02-20 LAB — CBC
HEMATOCRIT: 38 % (ref 36.0–46.0)
HEMOGLOBIN: 12.6 g/dL (ref 12.0–15.0)
MCHC: 33 g/dL (ref 30.0–36.0)
MCV: 92.2 fl (ref 78.0–100.0)
PLATELETS: 246 10*3/uL (ref 150.0–400.0)
RBC: 4.13 Mil/uL (ref 3.87–5.11)
RDW: 14.9 % (ref 11.5–15.5)
WBC: 4.3 10*3/uL (ref 4.0–10.5)

## 2014-02-23 DIAGNOSIS — G373 Acute transverse myelitis in demyelinating disease of central nervous system: Secondary | ICD-10-CM | POA: Insufficient documentation

## 2014-02-23 NOTE — Progress Notes (Signed)
Patient presents to clinic today for hospital follow-up after admission for transverse myelitis.  Patient treated with high-dose steroids with resolution of symptoms.  Was scheduled for outpatient Neurology follow-up with Dr. Tomi Likens.  Patient has yet to schedule this appointment. Endorses doing well since discharge.  Denies recurrence of symptoms.  Is still having some mild ride-sided weakness but states she is gaining her strength back daily.  Is set up for PT at home but is waiting for her first session. No other concerns at today's visit. Patient needing repeat labs -- BMP at today's visit.  Past Medical History  Diagnosis Date  . Seizure   . HA (headache)   . Viral encephalitis   . Viral meningitis     Current Outpatient Prescriptions on File Prior to Visit  Medication Sig Dispense Refill  . pantoprazole (PROTONIX) 40 MG tablet Take 1 tablet (40 mg total) by mouth daily at 6 (six) AM.  30 tablet  0   No current facility-administered medications on file prior to visit.    No Known Allergies  Family History  Problem Relation Age of Onset  . Hypertension Mother   . Heart disease Mother   . Diabetes Mother   . Hypertension Father   . Alzheimer's disease Father   . Diabetes Maternal Grandmother   . Hypertension Maternal Aunt   . Diabetes Maternal Aunt   . Hypertension Sister   . Healthy Brother     x3  . Healthy Daughter     x4  . Seizures Daughter   . Healthy Son     x1    History   Social History  . Marital Status: Single    Spouse Name: N/A    Number of Children: N/A  . Years of Education: N/A   Social History Main Topics  . Smoking status: Never Smoker   . Smokeless tobacco: Never Used  . Alcohol Use: No  . Drug Use: No  . Sexual Activity: None   Other Topics Concern  . None   Social History Narrative  . None    Review of Systems - See HPI.  All other ROS are negative.  BP 140/78  Pulse 92  Temp(Src) 98.2 F (36.8 C)  Wt 105 lb (47.628 kg)   SpO2 98%  Physical Exam  Vitals reviewed. Constitutional: She is oriented to person, place, and time and well-developed, well-nourished, and in no distress.  HENT:  Head: Normocephalic and atraumatic.  Eyes: Conjunctivae are normal. Pupils are equal, round, and reactive to light.  Neck: Neck supple.  Cardiovascular: Normal rate, regular rhythm, normal heart sounds and intact distal pulses.   Pulmonary/Chest: Effort normal and breath sounds normal. No respiratory distress. She has no wheezes. She has no rales. She exhibits no tenderness.  Neurological: She is alert and oriented to person, place, and time. She has normal sensation, normal reflexes and intact cranial nerves. GCS score is 15.  Muscular strength 4/5 and symmetric.  No evidence of facial drooping of difficulty with speech or understanding.  Skin: Skin is warm and dry. No rash noted.  Psychiatric: Affect normal.    Recent Results (from the past 2160 hour(s))  CBC WITH DIFFERENTIAL     Status: Abnormal   Collection Time    02/04/14  6:25 PM      Result Value Ref Range   WBC 4.8  4.0 - 10.5 K/uL   RBC 3.71 (*) 3.87 - 5.11 MIL/uL   Hemoglobin 11.3 (*) 12.0 -  15.0 g/dL   HCT 33.4 (*) 36.0 - 46.0 %   MCV 90.0  78.0 - 100.0 fL   MCH 30.5  26.0 - 34.0 pg   MCHC 33.8  30.0 - 36.0 g/dL   RDW 13.0  11.5 - 15.5 %   Platelets 280  150 - 400 K/uL   Neutrophils Relative % 53  43 - 77 %   Neutro Abs 2.5  1.7 - 7.7 K/uL   Lymphocytes Relative 34  12 - 46 %   Lymphs Abs 1.7  0.7 - 4.0 K/uL   Monocytes Relative 12  3 - 12 %   Monocytes Absolute 0.6  0.1 - 1.0 K/uL   Eosinophils Relative 1  0 - 5 %   Eosinophils Absolute 0.0  0.0 - 0.7 K/uL   Basophils Relative 0  0 - 1 %   Basophils Absolute 0.0  0.0 - 0.1 K/uL  BASIC METABOLIC PANEL     Status: Abnormal   Collection Time    02/04/14  6:25 PM      Result Value Ref Range   Sodium 140  137 - 147 mEq/L   Potassium 3.1 (*) 3.7 - 5.3 mEq/L   Chloride 100  96 - 112 mEq/L   CO2 25   19 - 32 mEq/L   Glucose, Bld 119 (*) 70 - 99 mg/dL   BUN 10  6 - 23 mg/dL   Creatinine, Ser 0.40 (*) 0.50 - 1.10 mg/dL   Calcium 9.2  8.4 - 10.5 mg/dL   GFR calc non Af Amer >90  >90 mL/min   GFR calc Af Amer >90  >90 mL/min   Comment: (NOTE)     The eGFR has been calculated using the CKD EPI equation.     This calculation has not been validated in all clinical situations.     eGFR's persistently <90 mL/min signify possible Chronic Kidney     Disease.   Anion gap 15  5 - 15  LIPID PANEL     Status: Abnormal   Collection Time    02/05/14 11:45 AM      Result Value Ref Range   Cholesterol 228 (*) 0 - 200 mg/dL   Triglycerides 35  <150 mg/dL   HDL 78  >39 mg/dL   Total CHOL/HDL Ratio 2.9     VLDL 7  0 - 40 mg/dL   LDL Cholesterol 143 (*) 0 - 99 mg/dL   Comment:            Total Cholesterol/HDL:CHD Risk     Coronary Heart Disease Risk Table                         Men   Women      1/2 Average Risk   3.4   3.3      Average Risk       5.0   4.4      2 X Average Risk   9.6   7.1      3 X Average Risk  23.4   11.0                Use the calculated Patient Ratio     above and the CHD Risk Table     to determine the patient's CHD Risk.                ATP III CLASSIFICATION (LDL):      <100  mg/dL   Optimal      100-129  mg/dL   Near or Above                        Optimal      130-159  mg/dL   Borderline      160-189  mg/dL   High      >190     mg/dL   Very High  HEMOGLOBIN A1C     Status: Abnormal   Collection Time    02/05/14 11:45 AM      Result Value Ref Range   Hemoglobin A1C 6.3 (*) <5.7 %   Comment: (NOTE)                                                                               According to the ADA Clinical Practice Recommendations for 2011, when     HbA1c is used as a screening test:      >=6.5%   Diagnostic of Diabetes Mellitus               (if abnormal result is confirmed)     5.7-6.4%   Increased risk of developing Diabetes Mellitus      References:Diagnosis and Classification of Diabetes Mellitus,Diabetes     MAYO,4599,77(SFSEL 1):S62-S69 and Standards of Medical Care in             Diabetes - 2011,Diabetes Care,2011,34 (Suppl 1):S11-S61.   Mean Plasma Glucose 134 (*) <117 mg/dL   Comment: Performed at [facility]     Status: None   Collection Time    02/05/14  2:30 PM      Result Value Ref Range   Opiates NONE DETECTED  NONE DETECTED   Cocaine NONE DETECTED  NONE DETECTED   Benzodiazepines NONE DETECTED  NONE DETECTED   Amphetamines NONE DETECTED  NONE DETECTED   Tetrahydrocannabinol NONE DETECTED  NONE DETECTED   Barbiturates NONE DETECTED  NONE DETECTED   Comment:            DRUG SCREEN FOR MEDICAL PURPOSES     ONLY.  IF CONFIRMATION IS NEEDED     FOR ANY PURPOSE, NOTIFY LAB     WITHIN 5 DAYS.                LOWEST DETECTABLE LIMITS     FOR URINE DRUG SCREEN     Drug Class       Cutoff (ng/mL)     Amphetamine      1000     Barbiturate      200     Benzodiazepine   953     Tricyclics       202     Opiates          300     Cocaine          300     THC              50  NEUROMYELITIS OPTICA AUTOAB, IGG     Status: None   Collection Time    02/05/14  9:31 PM  Result Value Ref Range   NMO-IgG POSITIVE  NEGATIVE   Comment: (NOTE)     FLAG:  H      This autoantibody supports the diagnosis of neuromyelitis       optica or a neuromyelitis optica spectrum disorder.       Seropositivity predicts high risk for relapse of myelitis,       optic neuritis or both.      Performed at [facility]  ANA     Status: Abnormal   Collection Time    02/05/14  9:31 PM      Result Value Ref Range   ANA POSITIVE (*) NEGATIVE   Comment: Performed at [place] ANTIBODY     Status: None   Collection Time    02/05/14  9:31 PM      Result Value Ref Range   SSA (Ro) (ENA) Antibody, IgG >8.0 POS  <1.0 NEG AI    Comment: Performed at [facility]     Status: None   Collection Time    02/05/14  9:31 PM      Result Value Ref Range   SSB (La) (ENA) Antibody, IgG <1.0 NEG  <1.0 NEG AI   Comment: Performed at [place]     Status: None   Collection Time    02/05/14  9:31 PM      Result Value Ref Range   Angiotensin-Converting Enzyme 20  8 - 52 U/L   Comment: Performed at [facility]  HIV-1 RNA, QUALITATIVE, TMA     Status: None   Collection Time    02/05/14  9:31 PM      Result Value Ref Range   HIV-1 RNA, Qualitative, TMA Not Detected  Not Detected   Comment: (NOTE)     This test was performed using the APTIMA(R) HIV-RNA     Qualitative Assay (Gen-Probe).     Performed at [facility]  HIV ANTIBODY (ROUTINE TESTING)     Status: None   Collection Time    02/05/14  9:31 PM      Result Value Ref Range   HIV 1&2 Ab, 4th Generation NONREACTIVE  NONREACTIVE   Comment: (NOTE)     A NONREACTIVE HIV Ag/Ab result does not exclude HIV infection since     the time frame for seroconversion is variable. If acute HIV infection     is suspected, a HIV-1 RNA Qualitative TMA test is recommended.     HIV-1/2 Antibody Diff         Not indicated.     HIV-1 RNA, Qual TMA           Not indicated.     PLEASE NOTE: This information has been disclosed to you from records     whose confidentiality may be protected by state law. If your state     requires such protection, then the state law prohibits you from making     any further disclosure of the information without the specific written     consent of the person to whom it pertains, or as otherwise permitted     by law. A general authorization for the release of medical or other     information is NOT sufficient for this purpose.     The performance of this assay has not been clinically validated in     patients less than 2  years old.     Performed at [facility]     Status: Abnormal   Collection Time    02/05/14  9:31 PM      Result Value Ref Range   Sed Rate 54 (*) 0 - 22 mm/hr  C-REACTIVE PROTEIN     Status: Abnormal   Collection Time    02/05/14  9:31 PM      Result Value Ref Range   CRP <0.5 (*) <0.60 mg/dL   Comment: Performed at [facility]     Status: None   Collection Time    02/05/14  9:31 PM      Result Value Ref Range   Rheumatoid Factor <10  <=14 IU/mL   Comment: (NOTE)                             Interpretive Table                        Low Positive: 15 - 41 IU/mL                        High Positive:  >= 42 IU/mL     In addition to the RF result, and clinical symptoms including joint     involvement, the 2010 ACR Classification Criteria for     scoring/diagnosing Rheumatoid Arthritis include the results of the     following tests:  CRP (26333), ESR (15010), and CCP (APCA) (54562).     www.rheumatology.org/practice/clinical/classification/ra/ra_2010.asp     Performed at [facility] AB-TITER (ANA TITER)     Status: Abnormal   Collection Time    02/05/14  9:31 PM      Result Value Ref Range   ANA Titer 1 1:160 (*) <1:40   Comment: (NOTE)     Reference Ranges:     1:40 - 1:80 Weakly positive, usually not clinically significant.     > or = to 1:160 Result may be clinically significant.                                                                             ANA Pattern 1 HOMOGENOUS     Comment: Performed at [facility]     Status: Abnormal   Collection Time    02/06/14  3:35 PM      Result Value Ref Range   Sodium 142  137 - 147 mEq/L   Potassium 4.0  3.7 - 5.3 mEq/L   Chloride 102  96 - 112 mEq/L   CO2 26  19 - 32 mEq/L   Glucose, Bld 180 (*) 70 - 99 mg/dL   BUN 16  6 - 23 mg/dL   Creatinine, Ser 0.48 (*) 0.50 - 1.10 mg/dL   Calcium 9.6  8.4 - 10.5 mg/dL   Total Protein 7.9  6.0 - 8.3 g/dL    Albumin 3.5  3.5 - 5.2 g/dL   AST 18  0 - 37 U/L   ALT 13  0 - 35 U/L  Alkaline Phosphatase 72  39 - 117 U/L   Total Bilirubin <0.2 (*) 0.3 - 1.2 mg/dL   GFR calc non Af Amer >90  >90 mL/min   GFR calc Af Amer >90  >90 mL/min   Comment: (NOTE)     The eGFR has been calculated using the CKD EPI equation.     This calculation has not been validated in all clinical situations.     eGFR's persistently <90 mL/min signify possible Chronic Kidney     Disease.   Anion gap 14  5 - 15  CSF IGG INDEX     Status: None   Collection Time    02/06/14  7:18 PM      Result Value Ref Range   IgG, CSF 5.2  0.8 - 7.7 mg/dL   Comment: Performed at [place], CSF     Status: Abnormal   Collection Time    02/06/14  7:18 PM      Result Value Ref Range   Glucose, CSF 88 (*) 43 - 76 mg/dL   Total  Protein, CSF 48 (*) 15 - 45 mg/dL  VDRL, CSF     Status: None   Collection Time    02/06/14  7:18 PM      Result Value Ref Range   VDRL Quant, CSF Nonreactive  Nonreactive   Comment: Performed at [facility] PCR     Status: None   Collection Time    02/06/14  7:18 PM      Result Value Ref Range   Enterovirus PCR Not Detected  Not Detected   Comment: (NOTE)     This assay is designed to detect multiple strains     of Enterovirus, including Enterovirus D68.     This test was developed and its performance     characteristics have been determined by [employer], [place], [place].     Performance characteristics refer to the     analytical performance of the test.     This test is performed pursuant to a license     agreement with [facility]     Comment: Performed at [facility] VIRUS(HSV) DNA BY PCR     Status: None   Collection Time    02/06/14  7:18 PM      Result Value Ref Range   Specimen source hsv CSF     HSV 1 DNA Not Detected  Not Detected   HSV 2 DNA Not  Detected  Not Detected   Comment: (NOTE)     Note:     This assay detects the presence of herpes simplex virus (HSV) DNA by     real-time polymerase chain reaction (PCR) amplification of the virus     polymerase gene.  A result "DETECTED" is reported if a fluorescent     signal for HSV DNA is detected. If HSV DNA is present, the virus is     further characterized into subtype 1 or subtype 2 according     type-specific differences in melting temperature. The assay is     performed in the presence of an internal PCR control to ensure     efficient sample extraction and the absence of PCR inhibitors in the     sample.     This test was developed and its analytical performance characteristics  have been determined by [facility].  It has not been cleared     or approved by the U.S. Food and Drug Administration.  The FDA has     determined that such clearance or approval is not necessary. This     assay has been validated pursuant to the CLIA regulations and is used     for clinical purposes.      Performed at [facility]     Status: None   Collection Time    02/06/14  7:18 PM      Result Value Ref Range   Source Varicella-Zoster, PCR CSF     Varicella-Zoster, PCR Not Detected  Not Detected   Comment: (NOTE)     This test was developed and its performance     characteristics have been determined by [employer], [place], [place].     Performance characteristics refer to the     analytical performance of the test.     This test is performed pursuant to a license     agreement with [place].     Performed at [place], QUALITATIVE     Status: None   Collection Time    02/06/14  7:18 PM      Result Value Ref Range   Specimen Source CMVPCR CSF     Cytomegalovirus DNA NOT DETECTED     Comment: (NOTE)     Reference range:  Not Detected     This test was developed  and its performance     characteristics have been determined by [employer], [place], [place].     Performance characteristics refer to the     analytical performance of the test.     Performed at [place], IGG AND IGM, CSF     Status: None   Collection Time    02/06/14  7:18 PM      Result Value Ref Range   West Nile Ab, IgG, CSF <1.30  <1.30 index   West Nile Ab, IgM, CSF <0.90  <0.90 index   Comment: (NOTE)     Interpretive Criteria:     IgG  <1.30     Antibody not detected         1.30-1.49 Equivocal         >=1.50    Antibody detected     IgM  <0.90     Antibody not detected         0.90-1.10 Equivocal         >1.10     Antibody detected     West Nile virus (WNV) IgM is usually detectable in CSF     from WNV-infected patients with encephalitis or     meningitis at the time of clinical presentation.     Because IgM antibody does not readily cross the blood-     brain barrier, IgM antibody in CSF strongly suggests     acute central nervous system infection.     WNV antibody results from CSF should be interpreted     with caution. Possible complicating factors include low     levels of antibody found in CSF, passive transfer of     antibodies from blood, and contamination via bloody     spinal taps.     Antibodies induced by other flavivirus infections     (  e.g. Dengue virus, [place] encephalitis virus)     may show cross-reactivity with WNV.     Performed at [facility]  CSF CELL COUNT WITH DIFFERENTIAL     Status: Abnormal   Collection Time    02/06/14  7:20 PM      Result Value Ref Range   Tube # 1     Color, CSF COLORLESS  COLORLESS   Appearance, CSF CLEAR  CLEAR   Supernatant NOT INDICATED     RBC Count, CSF 1 (*) 0 /cu mm   WBC, CSF 54 (*) 0 - 5 /cu mm   Comment: CRITICAL RESULT CALLED TO, READ BACK BY AND VERIFIED WITH:     E. MESSER RN [phone] GREEN R   Lymphs, CSF 90 (*) 40 - 80 %    Monocyte-Macrophage-Spinal Fluid 10 (*) 15 - 45 %  CSF CULTURE     Status: None   Collection Time    02/06/14  7:20 PM      Result Value Ref Range   Specimen Description CSF     Special Requests NO 4 4CC     Gram Stain       Value: CYTSOSPIN SLIDE WBC PRESENT, PREDOMINANTLY MONONUCLEAR     NO ORGANISMS SEEN     Performed at [facility]     Performed at [facility]   Culture       Value: NO GROWTH 3 DAYS     Performed at [facility]   Report Status PENDING    GRAM STAIN     Status: None   Collection Time    02/06/14  7:20 PM      Result Value Ref Range   Specimen Description CSF     Special Requests NONE     Gram Stain       Value: WBC PRESENT, PREDOMINANTLY MONONUCLEAR     NO ORGANISMS SEEN     CYTOSPIN SLIDE   Report Status 02/06/2014 FINAL    PATHOLOGIST SMEAR REVIEW     Status: None   Collection Time    02/06/14  7:20 PM      Result Value Ref Range   Path Review LYMPHOCYTOSIS.     Comment: Reviewed by Chrystie Nose. Saralyn Pilar, M.D.     02/07/2014.  OLIGOCLONAL BANDS, CSF + SERM     Status: None   Collection Time    02/06/14  7:21 PM      Result Value Ref Range   CSF Oligoclonal Bands REPORT     Comment: (NOTE)     **Bands noted**        Reference Range: No bands     One Gamma restriction band is present in the     patient's CSF that lines up with the one gamma     restriction band present in the patient's cor-     responding serum sample.  This electrophoretic     pattern is suggestive of a monoclonal gammopathy.     Clinical correlation is recommended for best     interpretation.     Oligoclonal bands are present in the CSF of more than     85% of patients with clinically definite multiple     sclerosis (MS). To distinguish between oligoclonal     bands in the CSF due to a peripheral gammopathy and     oligoclonal bands due to local production in the CNS,     serum and CSF  should be tested simultaneously.     Oligoclonal bands can however be  observed in a variety     of other diseases, e.g., subacute sclerosing panen-     cephalitis, inflammatory polyneuropathy, CNS lupus,     and brain tumors and infarctions. The clinical     significance of a numerical band count, determined     by isoelectric focusing, has not been definitively     defined. The data should be interpreted in conjunction     with all pertinent clinical and laboratory data for     this patient.     Performed at [place] ANTIBODY-IGG     Status: None   Collection Time    02/06/14  9:57 PM      Result Value Ref Range   Jo-1 Antibody, IgG <1.0 NEG  <1.0 NEG AI   Comment: Performed at [place]: None   Collection Time    02/06/14  9:57 PM      Result Value Ref Range   Scleroderma (Scl-70) (ENA) Antibody, IgG <1.0 NEG  <1.0 NEG AI   Comment: Performed at [place], DOUBLE-STRANDED     Status: None   Collection Time    02/06/14  9:57 PM      Result Value Ref Range   ds DNA Ab 2     Comment: (NOTE)                                  IU/mL       Interpretation                                  < or = 4    Negative                                  5-9         Indeterminate                                  > or = 10   Positive     Performed at [facility]  ANTI-SMITH ANTIBODY     Status: None   Collection Time    02/06/14  9:57 PM      Result Value Ref Range   ENA SM Ab Ser-aCnc <1.0 NEG  <1.0 NEG AI   Comment: Performed at [facility]  CBC     Status: Abnormal   Collection Time    02/07/14  5:22 AM      Result Value Ref Range   WBC 7.2  4.0 - 10.5 K/uL   RBC 3.85 (*) 3.87 - 5.11 MIL/uL   Hemoglobin 11.8 (*) 12.0 - 15.0 g/dL   HCT 34.8 (*) 36.0 - 46.0 %   MCV 90.4  78.0 - 100.0 fL   MCH 30.6  26.0 - 34.0 pg   MCHC 33.9  30.0 - 36.0 g/dL   RDW 14.0  11.5 - 15.5 %   Platelets 324  150 - 400 K/uL  BASIC METABOLIC PANEL     Status:  Abnormal   Collection Time  02/07/14  5:22 AM      Result Value Ref Range   Sodium 138  137 - 147 mEq/L   Potassium 4.5  3.7 - 5.3 mEq/L   Chloride 100  96 - 112 mEq/L   CO2 24  19 - 32 mEq/L   Glucose, Bld 142 (*) 70 - 99 mg/dL   BUN 14  6 - 23 mg/dL   Creatinine, Ser 0.42 (*) 0.50 - 1.10 mg/dL   Calcium 9.6  8.4 - 10.5 mg/dL   GFR calc non Af Amer >90  >90 mL/min   GFR calc Af Amer >90  >90 mL/min   Comment: (NOTE)     The eGFR has been calculated using the CKD EPI equation.     This calculation has not been validated in all clinical situations.     eGFR's persistently <90 mL/min signify possible Chronic Kidney     Disease.   Anion gap 14  5 - 15  CBC     Status: None   Collection Time    02/19/14  2:24 PM      Result Value Ref Range   WBC 4.3  4.0 - 10.5 K/uL   RBC 4.13  3.87 - 5.11 Mil/uL   Platelets 246.0  150.0 - 400.0 K/uL   Hemoglobin 12.6  12.0 - 15.0 g/dL   HCT 38.0  36.0 - 46.0 %   MCV 92.2  78.0 - 100.0 fl   MCHC 33.0  30.0 - 36.0 g/dL   RDW 14.9  11.5 - 16.5 %  BASIC METABOLIC PANEL     Status: None   Collection Time    02/19/14  2:24 PM      Result Value Ref Range   Sodium 137  135 - 145 mEq/L   Potassium 4.1  3.5 - 5.1 mEq/L   Chloride 102  96 - 112 mEq/L   CO2 23  19 - 32 mEq/L   Glucose, Bld 94  70 - 99 mg/dL   BUN 17  6 - 23 mg/dL   Creatinine, Ser 0.5  0.4 - 1.2 mg/dL   Calcium 9.6  8.4 - 10.5 mg/dL   GFR 177.59  >60.00 mL/min    Assessment/Plan: Transverse myelitis Doing well overall. No recurrence of symptoms.  Strength improving although still residual weakness, most prominent in R hand.  PT in place.  Appointment with Neurology facilitated while patient in office.  Will obtain repeat BMP.  FMLA paperwork will be filled out.  Patient will be notified upon completion.  Follow-up in 1 month.

## 2014-02-23 NOTE — Assessment & Plan Note (Signed)
Doing well overall. No recurrence of symptoms.  Strength improving although still residual weakness, most prominent in R hand.  PT in place.  Appointment with Neurology facilitated while patient in office.  Will obtain repeat BMP.  FMLA paperwork will be filled out.  Patient will be notified upon completion.  Follow-up in 1 month.

## 2014-02-24 ENCOUNTER — Telehealth: Payer: Self-pay | Admitting: *Deleted

## 2014-02-24 NOTE — Telephone Encounter (Signed)
Received completed paperwork from [facility]. Called and informed patient that paperwork is at front desk. There a few sections that patient needs to fill out. JG//CMA

## 2014-02-25 LAB — CYTOMEGALOVIRUS PCR, QUALITATIVE: Cytomegalovirus DNA: NOT DETECTED

## 2014-02-25 LAB — HERPES SIMPLEX VIRUS(HSV) DNA BY PCR
HSV 1 DNA: NOT DETECTED
HSV 2 DNA: NOT DETECTED

## 2014-02-25 LAB — WEST NILE AB, IGG AND IGM, CSF
West Nile Ab, IgG, CSF: <1.3 index (ref <1.30)
West Nile Ab, IgM, CSF: <0.9 index (ref <0.90)

## 2014-02-25 LAB — ENTEROVIRUS PCR: Enterovirus PCR: NOT DETECTED

## 2014-02-25 LAB — CSF IGG: IgG, CSF: 5.2 mg/dL (ref 0.8–7.7)

## 2014-02-25 LAB — VDRL, CSF: SYPHILIS VDRL QUANT CSF: NONREACTIVE

## 2014-02-26 LAB — B. BURGDORFI ANTIBODIES, CSF: LYME AB: NEGATIVE

## 2014-02-27 ENCOUNTER — Ambulatory Visit: Payer: BC Managed Care – PPO | Admitting: Neurology

## 2014-03-03 ENCOUNTER — Encounter: Payer: Self-pay | Admitting: Neurology

## 2014-03-03 ENCOUNTER — Other Ambulatory Visit: Payer: Self-pay | Admitting: *Deleted

## 2014-03-03 ENCOUNTER — Telehealth: Payer: Self-pay | Admitting: Physician Assistant

## 2014-03-03 ENCOUNTER — Ambulatory Visit (INDEPENDENT_AMBULATORY_CARE_PROVIDER_SITE_OTHER): Payer: BC Managed Care – PPO | Admitting: Neurology

## 2014-03-03 VITALS — BP 170/88 | HR 78 | Temp 98.2°F | Resp 16 | Ht 60.0 in | Wt 108.1 lb

## 2014-03-03 DIAGNOSIS — G030 Nonpyogenic meningitis: Secondary | ICD-10-CM

## 2014-03-03 DIAGNOSIS — G360 Neuromyelitis optica [Devic]: Secondary | ICD-10-CM

## 2014-03-03 DIAGNOSIS — R569 Unspecified convulsions: Secondary | ICD-10-CM

## 2014-03-03 MED ORDER — BACLOFEN 10 MG PO TABS: ORAL_TABLET | ORAL | Status: DC

## 2014-03-03 NOTE — Patient Instructions (Addendum)
[facility] for evaluation and care of the transverse myelitis.  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  I want to see you back after your visit. 1.  I will give you baclofen for the hand spasms.  Take 1/2 to 1 tablet every 8 hours as needed.  Caution for drowsiness 2.  We will set you up for occupational therapy of the hand.

## 2014-03-03 NOTE — Telephone Encounter (Signed)
Would like for her to continue the DASH diet handout I gave her and follow-up with me next week for a BP recheck.  If still elevated, we will have 3 elevated BP readings and a medication needs to be started.

## 2014-03-03 NOTE — Telephone Encounter (Signed)
Caller name: Lyanne CoLee, Malaisha Relation to pt: self  Call back number: [phone]   Reason for call:   Pt was seen today at the neuro Dr. Brayton C[place]Jessie in [place] and stated as per your request to check in with her BP vitals which are 170/88

## 2014-03-03 NOTE — Progress Notes (Signed)
NEUROLOGY FOLLOW UP OFFICE NOTE  Taylor Wood [id number]  HISTORY OF PRESENT ILLNESS: Taylor Wood is a 63 year old right-handed woman with history of chronic headaches who follows up for seizure in setting of aseptic meningitis.  She is accompanied by her daughter.  Records and images were personally reviewed.     I last saw her in January 2015 for an isolated seizure in the setting of aseptic meningitis, which occurred in September 2014.  At that time, she had a fever of 103.1.  CSF revealed protein 78, glucose 68, culture negative, WBC 108 and 163, neutrophils 6 and 8, lymphs 74 and 78, monocytes 20 and 14, no eosinophils and RBC 8.  She did not remain on anticonvulsant therapy, as it appeared to be provoked.  She returns today for transverse myelitis.  She was admitted to [facility] on 02/04/14 after presenting for right arm and leg weakness, with a gradual onset of right arm pain and paresthesias over 2 weeks.  She had a temperature of 99.1.  MRI of the brain with and without contrast revealed remote right putaminal lacunar infarct and mild nonspecific punctate hyperintensities.  However, the cervical spine  and thoracic spine revealed T2 signal abnormality from C2 to T1 with associated cord edema, as well as asymmetric right-sided enhancement of the spinal cord from C3-4 through C6-7, extending 3.7 cm.  She underwent a lumbar puncture, which revealed CSF cell count of 54 with 90% lymphs, protein 48, glucose 88, gram stain with predominantly mononuclear WGC with no organisms, culture negative, 1 gamma restricted band, IgG index 5.2, VDRL non-reactive, varicella-zoster PCR negative, CMV negative, HSV 1 & 2 negative, enterovirus PCR negative, and West Nile IgG & IgM antibodies negative.  Blood work showed ANA 1:160 with homogenous pattern, sed rate 54, CRP negative, RF negative, HIV non-reactive, ACE 20, SSB antibodies negative, anti-scleroderma antibodies negative, ds DNA antibodies 2, anti-Smith  antibodies negative, and anti-Jo antibodies negative.  However, SSA antibodies were positive and NMO-IgG was positive.  She was treated with IV Solumedrol.  She continues to have right leg weakness and she has spasms in her right hand.  She is using a cane to help her ambulate.  She denies bowel or bladder dysfunction.  She denies visual disturbance.  She has not had any more seizures.  PAST MEDICAL HISTORY: Past Medical History  Diagnosis Date  . Seizure   . HA (headache)   . Viral encephalitis   . Viral meningitis     MEDICATIONS: Current Outpatient Prescriptions on File Prior to Visit  Medication Sig Dispense Refill  . pantoprazole (PROTONIX) 40 MG tablet Take 1 tablet (40 mg total) by mouth daily at 6 (six) AM.  30 tablet  0   No current facility-administered medications on file prior to visit.    ALLERGIES: No Known Allergies  FAMILY HISTORY: Family History  Problem Relation Age of Onset  . Hypertension Mother   . Heart disease Mother   . Diabetes Mother   . Hypertension Father   . Alzheimer's disease Father   . Diabetes Maternal Grandmother   . Hypertension Maternal Aunt   . Diabetes Maternal Aunt   . Hypertension Sister   . Healthy Brother     x3  . Healthy Daughter     x4  . Seizures Daughter   . Healthy Son     x1    SOCIAL HISTORY: History   Social History  . Marital Status: Single    Spouse Name: N/A  Number of Children: N/A  . Years of Education: N/A   Occupational History  . Not on file.   Social History Main Topics  . Smoking status: Never Smoker   . Smokeless tobacco: Never Used  . Alcohol Use: No  . Drug Use: No  . Sexual Activity: No   Other Topics Concern  . Not on file   Social History Narrative  . No narrative on file    REVIEW OF SYSTEMS: Constitutional: No fevers, chills, or sweats, no generalized fatigue, change in appetite Eyes: No visual changes, double vision, eye pain Ear, nose and throat: No hearing loss, ear  pain, nasal congestion, sore throat Cardiovascular: No chest pain, palpitations Respiratory:  No shortness of breath at rest or with exertion, wheezes GastrointestinaI: No nausea, vomiting, diarrhea, abdominal pain, fecal incontinence Genitourinary:  No dysuria, urinary retention or frequency Musculoskeletal:  Spasms of the right hand. Integumentary: No rash, pruritus, skin lesions Neurological: as above Psychiatric: No depression, insomnia, anxiety Endocrine: No palpitations, fatigue, diaphoresis, mood swings, change in appetite, change in weight, increased thirst Hematologic/Lymphatic:  No anemia, purpura, petechiae. Allergic/Immunologic: no itchy/runny eyes, nasal congestion, recent allergic reactions, rashes  PHYSICAL EXAM: Filed Vitals:   03/03/14 0909  BP: 170/88  Pulse: 78  Temp: 98.2 F (36.8 C)  Resp: 16   General: No acute distress Head:  Normocephalic/atraumatic Neck: supple, no paraspinal tenderness, full range of motion Heart:  Regular rate and rhythm Lungs:  Clear to auscultation bilaterally Back: No paraspinal tenderness Neurological Exam: alert and oriented to person, place, and time. Attention span and concentration intact, recent and remote memory intact, fund of knowledge intact.  Speech fluent and not dysarthric, language intact.  CN II-XII intact. Fundoscopic exam unremarkable without vessel changes, exudates, hemorrhages or papilledema.  Mild increased tone in right upper extremity and legs, muscle strength 5- in right biceps and triceps, 4+ right wrist extension, 4- right wrist extension, 5- grip, 4+ right ADM, 5- right knee flexion.  Otherwise, 5/5.  Right hand will flex into claw at times.  Sensation to light touch, temperature and vibration intact.  Deep tendon reflexes 2+ throughout, right toes downgoing and then withdraws, left Babinski.  Finger to nose and heel to shin testing intact.  Gait with right limp, Romberg positive.  IMPRESSION: Neuromyelitis  [facility] History of provoked seizure secondary to aspetic meningitis  PLAN: 1.  [facility]  She will likely need immunosuppressant therapy, such as rituximab, which I do not manage. 2.  Will prescribe baclofen for muscle spasms and OT for right hand weakness and to help prevent development of spasticity. 3.  [facility].  Shon MilletAdam Jaffe, DO  CC: Marcelline MatesWilliam Martin, PA-C

## 2014-03-04 NOTE — Telephone Encounter (Signed)
Patient informed, understood & agreed; f/u appt scheduled for Mon, 11.09.15 at 10:45a/SLS

## 2014-03-07 LAB — CSF CULTURE: Culture: NO GROWTH

## 2014-03-07 LAB — CSF CULTURE W GRAM STAIN

## 2014-03-17 ENCOUNTER — Encounter: Payer: Self-pay | Admitting: Physician Assistant

## 2014-03-17 ENCOUNTER — Ambulatory Visit (INDEPENDENT_AMBULATORY_CARE_PROVIDER_SITE_OTHER): Payer: BC Managed Care – PPO | Admitting: Physician Assistant

## 2014-03-17 VITALS — BP 165/66 | HR 79 | Temp 98.7°F | Resp 16 | Ht 60.0 in | Wt 112.5 lb

## 2014-03-17 DIAGNOSIS — I10 Essential (primary) hypertension: Secondary | ICD-10-CM

## 2014-03-17 DIAGNOSIS — K219 Gastro-esophageal reflux disease without esophagitis: Secondary | ICD-10-CM

## 2014-03-17 MED ORDER — PANTOPRAZOLE SODIUM 40 MG PO TBEC: 40.0000 mg | DELAYED_RELEASE_TABLET | Freq: Every day | ORAL | Status: DC

## 2014-03-17 MED ORDER — HYDROCHLOROTHIAZIDE 12.5 MG PO TABS: 12.5000 mg | ORAL_TABLET | Freq: Every day | ORAL | Status: DC

## 2014-03-17 NOTE — Patient Instructions (Signed)
For your Blood Pressure, please take the hydrochlorothiazide (HCTZ) daily as directed.  Read the information below on the DASH diet and begin implementing some of these changes to your diet.  Try to increase intake of potassium-rich foods like bananas, lima beans or spinach.  Follow-up with me in 4 weeks.  DASH Eating Plan DASH stands for "Dietary Approaches to Stop Hypertension." The DASH eating plan is a healthy eating plan that has been shown to reduce high blood pressure (hypertension). Additional health benefits may include reducing the risk of type 2 diabetes mellitus, heart disease, and stroke. The DASH eating plan may also help with weight loss. WHAT DO I NEED TO KNOW ABOUT THE DASH EATING PLAN? For the DASH eating plan, you will follow these general guidelines:  Choose foods with a percent daily value for sodium of less than 5% (as listed on the food label).  Use salt-free seasonings or herbs instead of table salt or sea salt.  Check with your health care provider or pharmacist before using salt substitutes.  Eat lower-sodium products, often labeled as "lower sodium" or "no salt added."  Eat fresh foods.  Eat more vegetables, fruits, and low-fat dairy products.  Choose whole grains. Look for the word "whole" as the first word in the ingredient list.  Choose fish and skinless chicken or [place] more often than red meat. Limit fish, poultry, and meat to 6 oz (170 g) each day.  Limit sweets, desserts, sugars, and sugary drinks.  Choose heart-healthy fats.  Limit cheese to 1 oz (28 g) per day.  Eat more home-cooked food and less restaurant, buffet, and fast food.  Limit fried foods.  Cook foods using methods other than frying.  Limit canned vegetables. If you do use them, rinse them well to decrease the sodium.  When eating at a restaurant, ask that your food be prepared with less salt, or no salt if possible. WHAT FOODS CAN I EAT? Seek help from a dietitian for individual  calorie needs. Grains Whole grain or whole wheat bread. Brown rice. Whole grain or whole wheat pasta. Quinoa, bulgur, and whole grain cereals. Low-sodium cereals. Corn or whole wheat flour tortillas. Whole grain cornbread. Whole grain crackers. Low-sodium crackers. Vegetables Fresh or frozen vegetables (raw, steamed, roasted, or grilled). Low-sodium or reduced-sodium tomato and vegetable juices. Low-sodium or reduced-sodium tomato sauce and paste. Low-sodium or reduced-sodium canned vegetables.  Fruits All fresh, canned (in natural juice), or frozen fruits. Meat and Other Protein Products Ground beef (85% or leaner), grass-fed beef, or beef trimmed of fat. Skinless chicken or [place]. Ground chicken or [place]. Pork trimmed of fat. All fish and seafood. Eggs. Dried beans, peas, or lentils. Unsalted nuts and seeds. Unsalted canned beans. Dairy Low-fat dairy products, such as skim or 1% milk, 2% or reduced-fat cheeses, low-fat ricotta or cottage cheese, or plain low-fat yogurt. Low-sodium or reduced-sodium cheeses. Fats and Oils Tub margarines without trans fats. Light or reduced-fat mayonnaise and salad dressings (reduced sodium). Avocado. Safflower, olive, or canola oils. Natural peanut or almond butter. Other Unsalted popcorn and pretzels. The items listed above may not be a complete list of recommended foods or beverages. Contact your dietitian for more options. WHAT FOODS ARE NOT RECOMMENDED? Grains White bread. White pasta. White rice. Refined cornbread. Bagels and croissants. Crackers that contain trans fat. Vegetables Creamed or fried vegetables. Vegetables in a cheese sauce. Regular canned vegetables. Regular canned tomato sauce and paste. Regular tomato and vegetable juices. Fruits Dried fruits. Canned fruit in light  or heavy syrup. Fruit juice. Meat and Other Protein Products Fatty cuts of meat. Ribs, chicken wings, bacon, sausage, bologna, salami, chitterlings, fatback, hot dogs,  bratwurst, and packaged luncheon meats. Salted nuts and seeds. Canned beans with salt. Dairy Whole or 2% milk, cream, half-and-half, and cream cheese. Whole-fat or sweetened yogurt. Full-fat cheeses or blue cheese. Nondairy creamers and whipped toppings. Processed cheese, cheese spreads, or cheese curds. Condiments Onion and garlic salt, seasoned salt, table salt, and sea salt. Canned and packaged gravies. Worcestershire sauce. Tartar sauce. Barbecue sauce. Teriyaki sauce. Soy sauce, including reduced sodium. Steak sauce. Fish sauce. Oyster sauce. Cocktail sauce. Horseradish. Ketchup and mustard. Meat flavorings and tenderizers. Bouillon cubes. Hot sauce. Tabasco sauce. Marinades. Taco seasonings. Relishes. Fats and Oils Butter, stick margarine, lard, shortening, ghee, and bacon fat. Coconut, palm kernel, or palm oils. Regular salad dressings. Other Pickles and olives. Salted popcorn and pretzels. The items listed above may not be a complete list of foods and beverages to avoid. Contact your dietitian for more information. WHERE CAN I FIND MORE INFORMATION? National Heart, Lung, and Blood Institute: [employer].itwww.nhlbi.nih.gov/health/health-topics/topics/dash/ Document Released: 04/14/2011 Document Revised: 09/09/2013 Document Reviewed: 02/27/2013 [facility] Patient Information 2015 [employer] [place]. This information is not intended to replace advice given to you by your health care provider. Make sure you discuss any questions you have with your health care provider.

## 2014-03-17 NOTE — Progress Notes (Signed)
Patient presents to clinic today c/o for management of hypertension.  BP Readings from Last 3 Encounters:  03/17/14 165/66  03/03/14 170/88  02/19/14 140/78   Patient denies chest pain, palpitations, lightheadedness, dizziness, headache or vision changes.  Endorses diet high in salt.  Is mostly sedentary but does go to PT twice weekly.  Denies other concerns at today's visit.  Past Medical History  Diagnosis Date  . Seizure   . HA (headache)   . Viral encephalitis   . Viral meningitis     Current Outpatient Prescriptions on File Prior to Visit  Medication Sig Dispense Refill  . baclofen (LIORESAL) 10 MG tablet Take 0.5-1tab every 8 hours as needed. 30 each 0   No current facility-administered medications on file prior to visit.    No Known Allergies  Family History  Problem Relation Age of Onset  . Hypertension Mother   . Heart disease Mother   . Diabetes Mother   . Hypertension Father   . Alzheimer's disease Father   . Diabetes Maternal Grandmother   . Hypertension Maternal Aunt   . Diabetes Maternal Aunt   . Hypertension Sister   . Healthy Brother     x3  . Healthy Daughter     x4  . Seizures Daughter   . Healthy Son     x1    History   Social History  . Marital Status: Single    Spouse Name: N/A    Number of Children: N/A  . Years of Education: N/A   Social History Main Topics  . Smoking status: Never Smoker   . Smokeless tobacco: Never Used  . Alcohol Use: No  . Drug Use: No  . Sexual Activity: No   Other Topics Concern  . None   Social History Narrative    Review of Systems - See HPI.  All other ROS are negative.  BP 165/66 mmHg  Pulse 79  Temp(Src) 98.7 F (37.1 C) (Oral)  Resp 16  Ht 5' (1.524 m)  Wt 112 lb 8 oz (51.03 kg)  BMI 21.97 kg/m2  SpO2 99%  Physical Exam  Constitutional: She is oriented to person, place, and time and well-developed, well-nourished, and in no distress.  HENT:  Head: Normocephalic and atraumatic.    Eyes: Conjunctivae are normal.  Neck: Neck supple.  Cardiovascular: Normal rate, regular rhythm, normal heart sounds and intact distal pulses.   Pulmonary/Chest: Effort normal and breath sounds normal. No respiratory distress. She has no wheezes. She has no rales. She exhibits no tenderness.  Neurological: She is alert and oriented to person, place, and time.  Skin: Skin is warm and dry. No rash noted.  Psychiatric: Affect normal.  Vitals reviewed.   Recent Results (from the past 2160 hour(s))  CBC with Differential     Status: Abnormal   Collection Time: 02/04/14  6:25 PM  Result Value Ref Range   WBC 4.8 4.0 - 10.5 K/uL   RBC 3.71 (L) 3.87 - 5.11 MIL/uL   Hemoglobin 11.3 (L) 12.0 - 15.0 g/dL   HCT 33.4 (L) 36.0 - 46.0 %   MCV 90.0 78.0 - 100.0 fL   MCH 30.5 26.0 - 34.0 pg   MCHC 33.8 30.0 - 36.0 g/dL   RDW 13.0 11.5 - 15.5 %   Platelets 280 150 - 400 K/uL   Neutrophils Relative % 53 43 - 77 %   Neutro Abs 2.5 1.7 - 7.7 K/uL   Lymphocytes Relative 34 12 - 46 %  Lymphs Abs 1.7 0.7 - 4.0 K/uL   Monocytes Relative 12 3 - 12 %   Monocytes Absolute 0.6 0.1 - 1.0 K/uL   Eosinophils Relative 1 0 - 5 %   Eosinophils Absolute 0.0 0.0 - 0.7 K/uL   Basophils Relative 0 0 - 1 %   Basophils Absolute 0.0 0.0 - 0.1 K/uL  Basic metabolic panel     Status: Abnormal   Collection Time: 02/04/14  6:25 PM  Result Value Ref Range   Sodium 140 137 - 147 mEq/L   Potassium 3.1 (L) 3.7 - 5.3 mEq/L   Chloride 100 96 - 112 mEq/L   CO2 25 19 - 32 mEq/L   Glucose, Bld 119 (H) 70 - 99 mg/dL   BUN 10 6 - 23 mg/dL   Creatinine, Ser 0.40 (L) 0.50 - 1.10 mg/dL   Calcium 9.2 8.4 - 10.5 mg/dL   GFR calc non Af Amer >90 >90 mL/min   GFR calc Af Amer >90 >90 mL/min    Comment: (NOTE) The eGFR has been calculated using the CKD EPI equation. This calculation has not been validated in all clinical situations. eGFR's persistently <90 mL/min signify possible Chronic Kidney Disease.   Anion gap 15 5 - 15   Lipid panel     Status: Abnormal   Collection Time: 02/05/14 11:45 AM  Result Value Ref Range   Cholesterol 228 (H) 0 - 200 mg/dL   Triglycerides 35 <150 mg/dL   HDL 78 >39 mg/dL   Total CHOL/HDL Ratio 2.9 RATIO   VLDL 7 0 - 40 mg/dL   LDL Cholesterol 143 (H) 0 - 99 mg/dL    Comment:        Total Cholesterol/HDL:CHD Risk Coronary Heart Disease Risk Table                     Men   Women  1/2 Average Risk   3.4   3.3  Average Risk       5.0   4.4  2 X Average Risk   9.6   7.1  3 X Average Risk  23.4   11.0        Use the calculated Patient Ratio above and the CHD Risk Table to determine the patient's CHD Risk.        ATP III CLASSIFICATION (LDL):  <100     mg/dL   Optimal  100-129  mg/dL   Near or Above                    Optimal  130-159  mg/dL   Borderline  160-189  mg/dL   High  >190     mg/dL   Very High  Hemoglobin A1c     Status: Abnormal   Collection Time: 02/05/14 11:45 AM  Result Value Ref Range   Hgb A1c MFr Bld 6.3 (H) <5.7 %    Comment: (NOTE)                                                                       According to the ADA Clinical Practice Recommendations for 2011, when HbA1c is used as a screening test:  >=6.5%   Diagnostic of Diabetes Mellitus           (  if abnormal result is confirmed) 5.7-6.4%   Increased risk of developing Diabetes Mellitus References:Diagnosis and Classification of Diabetes Mellitus,Diabetes SJGG,8366,29(UTMLY 1):S62-S69 and Standards of Medical Care in         Diabetes - 2011,Diabetes YTKP,5465,68 (Suppl 1):S11-S61.   Mean Plasma Glucose 134 (H) <117 mg/dL    Comment: Performed at [facility]  Urine rapid drug screen (hosp performed)     Status: None   Collection Time: 02/05/14  2:30 PM  Result Value Ref Range   Opiates NONE DETECTED NONE DETECTED   Cocaine NONE DETECTED NONE DETECTED   Benzodiazepines NONE DETECTED NONE DETECTED   Amphetamines NONE DETECTED NONE DETECTED   Tetrahydrocannabinol NONE DETECTED  NONE DETECTED   Barbiturates NONE DETECTED NONE DETECTED    Comment:        DRUG SCREEN FOR MEDICAL PURPOSES ONLY.  IF CONFIRMATION IS NEEDED FOR ANY PURPOSE, NOTIFY LAB WITHIN 5 DAYS.        LOWEST DETECTABLE LIMITS FOR URINE DRUG SCREEN Drug Class       Cutoff (ng/mL) Amphetamine      1000 Barbiturate      200 Benzodiazepine   127 Tricyclics       517 Opiates          300 Cocaine          300 THC              50  Neuromyelitis optica autoab, IgG     Status: None   Collection Time: 02/05/14  9:31 PM  Result Value Ref Range   NMO-IgG POSITIVE NEGATIVE    Comment: (NOTE) FLAG:  H  This autoantibody supports the diagnosis of neuromyelitis   optica or a neuromyelitis optica spectrum disorder.   Seropositivity predicts high risk for relapse of myelitis,   optic neuritis or both.  Performed at [facility]  ANA     Status: Abnormal   Collection Time: 02/05/14  9:31 PM  Result Value Ref Range   ANA Ser Ql POSITIVE (A) NEGATIVE    Comment: Performed at [facility] extractable nuclear antibody     Status: None   Collection Time: 02/05/14  9:31 PM  Result Value Ref Range   SSA (Ro) (ENA) Antibody, IgG >8.0 POS <1.0 NEG AI    Comment: Performed at [place] syndrome-B extractable nuclear antibody     Status: None   Collection Time: 02/05/14  9:31 PM  Result Value Ref Range   SSB (La) (ENA) Antibody, IgG <1.0 NEG <1.0 NEG AI    Comment: Performed at [facility]  Angiotensin converting enzyme     Status: None   Collection Time: 02/05/14  9:31 PM  Result Value Ref Range   Angiotensin-Converting Enzyme 20 8 - 52 U/L    Comment: Performed at [facility]  HIV-1 RNA, Qualitative, TMA     Status: None   Collection Time: 02/05/14  9:31 PM  Result Value Ref Range   HIV-1 RNA, Qualitative, TMA Not Detected Not Detected    Comment: (NOTE) This test was performed using the APTIMA(R) HIV-RNA Qualitative  Assay (Gen-Probe). Performed at [facility]  HIV antibody     Status: None   Collection Time: 02/05/14  9:31 PM  Result Value Ref Range   HIV 1&2 Ab, 4th Generation NONREACTIVE NONREACTIVE    Comment: (NOTE) A NONREACTIVE HIV Ag/Ab result does not exclude HIV infection since the time frame for seroconversion is  variable. If acute HIV infection is suspected, a HIV-1 RNA Qualitative TMA test is recommended. HIV-1/2 Antibody Diff         Not indicated. HIV-1 RNA, Qual TMA           Not indicated. PLEASE NOTE: This information has been disclosed to you from records whose confidentiality may be protected by state law. If your state requires such protection, then the state law prohibits you from making any further disclosure of the information without the specific written consent of the person to whom it pertains, or as otherwise permitted by law. A general authorization for the release of medical or other information is NOT sufficient for this purpose. The performance of this assay has not been clinically validated in patients less than 69 years old. Performed at [facility]  Sedimentation rate     Status: Abnormal   Collection Time: 02/05/14  9:31 PM  Result Value Ref Range   Sed Rate 54 (H) 0 - 22 mm/hr  C-reactive protein     Status: Abnormal   Collection Time: 02/05/14  9:31 PM  Result Value Ref Range   CRP <0.5 (L) <0.60 mg/dL    Comment: Performed at [facility]  Rheumatoid factor     Status: None   Collection Time: 02/05/14  9:31 PM  Result Value Ref Range   Rhuematoid fact SerPl-aCnc <10 <=14 IU/mL    Comment: (NOTE)                         Interpretive Table                    Low Positive: 15 - 41 IU/mL                    High Positive:  >= 42 IU/mL In addition to the RF result, and clinical symptoms including joint involvement, the 2010 ACR Classification Criteria for scoring/diagnosing Rheumatoid Arthritis include the results of the following tests:  CRP  (76283), ESR (15010), and CCP (APCA) (15176). www.rheumatology.org/practice/clinical/classification/ra/ra_2010.asp Performed at [place] ab-titer (ANA titer)     Status: Abnormal   Collection Time: 02/05/14  9:31 PM  Result Value Ref Range   ANA Titer 1 1:160 (H) <1:40    Comment: (NOTE) Reference Ranges: 1:40 - 1:80 Weakly positive, usually not clinically significant. > or = to 1:160 Result may be clinically significant.                                                                         ANA Pattern 1 HOMOGENOUS     Comment: Performed at [facility]  Comprehensive metabolic panel     Status: Abnormal   Collection Time: 02/06/14  3:35 PM  Result Value Ref Range   Sodium 142 137 - 147 mEq/L   Potassium 4.0 3.7 - 5.3 mEq/L   Chloride 102 96 - 112 mEq/L   CO2 26 19 - 32 mEq/L   Glucose, Bld 180 (H) 70 - 99 mg/dL   BUN 16 6 - 23 mg/dL   Creatinine, Ser 0.48 (L) 0.50 - 1.10 mg/dL   Calcium 9.6 8.4 - 10.5 mg/dL  Total Protein 7.9 6.0 - 8.3 g/dL   Albumin 3.5 3.5 - 5.2 g/dL   AST 18 0 - 37 U/L   ALT 13 0 - 35 U/L   Alkaline Phosphatase 72 39 - 117 U/L   Total Bilirubin <0.2 (L) 0.3 - 1.2 mg/dL   GFR calc non Af Amer >90 >90 mL/min   GFR calc Af Amer >90 >90 mL/min    Comment: (NOTE) The eGFR has been calculated using the CKD EPI equation. This calculation has not been validated in all clinical situations. eGFR's persistently <90 mL/min signify possible Chronic Kidney Disease.   Anion gap 14 5 - 15  B. burgdorfi antibodies, CSF     Status: None   Collection Time: 02/06/14  7:18 PM  Result Value Ref Range   Lyme Ab SCREENED NEGATIVE     Comment: (NOTE) The complete series of assays could not be performed because the sample was negative for Lyme antibodies. CONTROL AB INDEX: TNP - TEST NOT REQUIRED ALBUMIN RATIO:    TNP - TEST NOT REQUIRED INTERPRETATION:   TNP - TEST NOT REQUIRED REFERENCE RANGES: LYME DISEASE ANTIBODY INDEX:    < or =  1.0 Negative     1.1 - 1.2 Equivocal    > or = 1.3 Positive CONTROL ANTIBODY INDEX:    < or = 1.0 ALBUMIN RATIO:    <0.0078 The Lyme Disease Antibody Index is used as an aid for the diagnosis of neuroborreliosis. An increased Lyme Disease Antibody Index (>1.2), accompanied by a Control Antibody Index of less than 1.0 and an Albumin Ratio of less than 0.0078, is strong evidence for intrathecal synthesis of organism-specific antibody, and thus CNS B. burgdorferi infection. Elevation of either the control antibody index, the Albumin Ratio, or both may indicate leakage of antibody across the blood-brain barrier which may falsely elevate the Lyme Disease Antibody Index. Performed at [facility]  CSF IgG index     Status: None   Collection Time: 02/06/14  7:18 PM  Result Value Ref Range   IgG, CSF 5.2 0.8 - 7.7 mg/dL    Comment: Performed at [facility] and glucose, CSF     Status: Abnormal   Collection Time: 02/06/14  7:18 PM  Result Value Ref Range   Glucose, CSF 88 (H) 43 - 76 mg/dL   Total  Protein, CSF 48 (H) 15 - 45 mg/dL  VDRL, CSF     Status: None   Collection Time: 02/06/14  7:18 PM  Result Value Ref Range   VDRL Quant, CSF Nonreactive Nonreactive    Comment: Performed at [facility]  Enterovirus pcr     Status: None   Collection Time: 02/06/14  7:18 PM  Result Value Ref Range   Enterovirus PCR Not Detected Not Detected    Comment: (NOTE) This assay is designed to detect multiple strains of Enterovirus, including Enterovirus D68. This test was developed and its performance characteristics have been determined by [facility], [place], [place]. Performance characteristics refer to the analytical performance of the test. This test is performed pursuant to a license agreement with [facility]     Comment: Performed at [facility]  Herpes simplex virus(hsv) dna by pcr      Status: None   Collection Time: 02/06/14  7:18 PM  Result Value Ref Range   Specimen source hsv CSF    HSV 1 DNA Not Detected Not Detected  HSV 2 DNA Not Detected Not Detected    Comment: (NOTE) Note: This assay detects the presence of herpes simplex virus (HSV) DNA by real-time polymerase chain reaction (PCR) amplification of the virus polymerase gene.  A result "DETECTED" is reported if a fluorescent signal for HSV DNA is detected. If HSV DNA is present, the virus is further characterized into subtype 1 or subtype 2 according type-specific differences in melting temperature. The assay is performed in the presence of an internal PCR control to ensure efficient sample extraction and the absence of PCR inhibitors in the sample. This test was developed and its analytical performance characteristics have been determined by [facility].  It has not been cleared or approved by the U.S. Food and Drug Administration.  The FDA has determined that such clearance or approval is not necessary. This assay has been validated pursuant to the CLIA regulations and is used for clinical purposes.  Performed at [facility] by PCR     Status: None   Collection Time: 02/06/14  7:18 PM  Result Value Ref Range   Source Varicella-Zoster, PCR CSF    Varicella-Zoster, PCR Not Detected Not Detected    Comment: (NOTE) This test was developed and its performance characteristics have been determined by [facility], [place], [place]. Performance characteristics refer to the analytical performance of the test. This test is performed pursuant to a license agreement with [facility]. Performed at [facility]  Cytomegalovirus PCR, qualitative     Status: None   Collection Time: 02/06/14  7:18 PM  Result Value Ref Range   Specimen Source CMVPCR CSF    Cytomegalovirus DNA NOT DETECTED     Comment: (NOTE) Reference range:  Not  Detected This test was developed and its performance characteristics have been determined by [facility], [place], [place]. Performance characteristics refer to the analytical performance of the test. Performed at [facility] Ab, IgG+IgM, CSF     Status: None   Collection Time: 02/06/14  7:18 PM  Result Value Ref Range   West Nile Ab, IgG, CSF <1.30 <1.30 index   West Nile Ab, IgM, CSF <0.90 <0.90 index    Comment: (NOTE) Interpretive Criteria: IgG  <1.30     Antibody not detected     1.30-1.49 Equivocal     >=1.50    Antibody detected IgM  <0.90     Antibody not detected     0.90-1.10 Equivocal     >1.10     Antibody detected West Nile virus (WNV) IgM is usually detectable in CSF from WNV-infected patients with encephalitis or meningitis at the time of clinical presentation. Because IgM antibody does not readily cross the blood- brain barrier, IgM antibody in CSF strongly suggests acute central nervous system infection. WNV antibody results from CSF should be interpreted with caution. Possible complicating factors include low levels of antibody found in CSF, passive transfer of antibodies from blood, and contamination via bloody spinal taps. Antibodies induced by other flavivirus infections (e.g. Dengue virus, [place] encephalitis virus) may show cross-reactivity with WNV. Performed at [facility]  CSF cell count with differential     Status: Abnormal   Collection Time: 02/06/14  7:20 PM  Result Value Ref Range   Tube # 1    Color, CSF COLORLESS COLORLESS   Appearance, CSF CLEAR CLEAR   Supernatant NOT INDICATED    RBC Count, CSF 1 (H) 0 /cu  mm   WBC, CSF 54 (HH) 0 - 5 /cu mm    Comment: CRITICAL RESULT CALLED TO, READ BACK BY AND VERIFIED WITH: E. MESSER RN [phone] GREEN R   Lymphs, CSF 90 (H) 40 - 80 %   Monocyte-Macrophage-Spinal Fluid 10 (L) 15 - 45 %  CSF culture     Status: None   Collection Time: 02/06/14   7:20 PM  Result Value Ref Range   Specimen Description CSF    Special Requests NO 4 4CC    Gram Stain      CYTSOSPIN SLIDE WBC PRESENT, PREDOMINANTLY MONONUCLEAR NO ORGANISMS SEEN Performed at [facility] Performed at [place] 3 DAYS Performed at [facility]   Report Status 03/07/2014 FINAL   Gram stain     Status: None   Collection Time: 02/06/14  7:20 PM  Result Value Ref Range   Specimen Description CSF    Special Requests NONE    Gram Stain      WBC PRESENT, PREDOMINANTLY MONONUCLEAR NO ORGANISMS SEEN CYTOSPIN SLIDE   Report Status 02/06/2014 FINAL   Pathologist smear review     Status: None   Collection Time: 02/06/14  7:20 PM  Result Value Ref Range   Path Review LYMPHOCYTOSIS.     Comment: Reviewed by Chrystie Nose. Saralyn Pilar, M.D. 02/07/2014.  Oligoclonal bands, CSF + serm     Status: None   Collection Time: 02/06/14  7:21 PM  Result Value Ref Range   CSF Oligoclonal Bands REPORT     Comment: (NOTE) **Bands noted**        Reference Range: No bands One Gamma restriction band is present in the patient's CSF that lines up with the one gamma restriction band present in the patient's cor- responding serum sample.  This electrophoretic pattern is suggestive of a monoclonal gammopathy. Clinical correlation is recommended for best interpretation. Oligoclonal bands are present in the CSF of more than 85% of patients with clinically definite multiple sclerosis (MS). To distinguish between oligoclonal bands in the CSF due to a peripheral gammopathy and oligoclonal bands due to local production in the CNS, serum and CSF should be tested simultaneously. Oligoclonal bands can however be observed in a variety of other diseases, e.g., subacute sclerosing panen- cephalitis, inflammatory polyneuropathy, CNS lupus, and brain tumors and infarctions. The clinical significance of a numerical band count, determined by isoelectric  focusing, has not been definitively defined. The data should be interpreted in conjunction with all pertinent clinical and laboratory data for this patient. Performed at [facility]  Jo-1 antibody-IgG     Status: None   Collection Time: 02/06/14  9:57 PM  Result Value Ref Range   Jo-1 Antibody, IgG <1.0 NEG <1.0 NEG AI    Comment: Performed at [facility] antibody     Status: None   Collection Time: 02/06/14  9:57 PM  Result Value Ref Range   Scleroderma (Scl-70) (ENA) Antibody, IgG <1.0 NEG <1.0 NEG AI    Comment: Performed at [facility]  Anti-DNA antibody, double-stranded     Status: None   Collection Time: 02/06/14  9:57 PM  Result Value Ref Range   ds DNA Ab 2 IU/mL    Comment: (NOTE)                              IU/mL  Interpretation                              < or = 4    Negative                              5-9         Indeterminate                              > or = 10   Positive Performed at [facility]  Anti-Smith antibody     Status: None   Collection Time: 02/06/14  9:57 PM  Result Value Ref Range   ENA SM Ab Ser-aCnc <1.0 NEG <1.0 NEG AI    Comment: Performed at [facility]  CBC     Status: Abnormal   Collection Time: 02/07/14  5:22 AM  Result Value Ref Range   WBC 7.2 4.0 - 10.5 K/uL   RBC 3.85 (L) 3.87 - 5.11 MIL/uL   Hemoglobin 11.8 (L) 12.0 - 15.0 g/dL   HCT 34.8 (L) 36.0 - 46.0 %   MCV 90.4 78.0 - 100.0 fL   MCH 30.6 26.0 - 34.0 pg   MCHC 33.9 30.0 - 36.0 g/dL   RDW 14.0 11.5 - 15.5 %   Platelets 324 150 - 400 K/uL  Basic metabolic panel     Status: Abnormal   Collection Time: 02/07/14  5:22 AM  Result Value Ref Range   Sodium 138 137 - 147 mEq/L   Potassium 4.5 3.7 - 5.3 mEq/L   Chloride 100 96 - 112 mEq/L   CO2 24 19 - 32 mEq/L   Glucose, Bld 142 (H) 70 - 99 mg/dL   BUN 14 6 - 23 mg/dL   Creatinine, Ser 0.42 (L) 0.50 - 1.10 mg/dL   Calcium 9.6 8.4 - 10.5 mg/dL   GFR calc  non Af Amer >90 >90 mL/min   GFR calc Af Amer >90 >90 mL/min    Comment: (NOTE) The eGFR has been calculated using the CKD EPI equation. This calculation has not been validated in all clinical situations. eGFR's persistently <90 mL/min signify possible Chronic Kidney Disease.   Anion gap 14 5 - 15  CBC     Status: None   Collection Time: 02/19/14  2:24 PM  Result Value Ref Range   WBC 4.3 4.0 - 10.5 K/uL   RBC 4.13 3.87 - 5.11 Mil/uL   Platelets 246.0 150.0 - 400.0 K/uL   Hemoglobin 12.6 12.0 - 15.0 g/dL   HCT 38.0 36.0 - 46.0 %   MCV 92.2 78.0 - 100.0 fl   MCHC 33.0 30.0 - 36.0 g/dL   RDW 14.9 11.5 - 51.8 %  Basic Metabolic Panel (BMET)     Status: None   Collection Time: 02/19/14  2:24 PM  Result Value Ref Range   Sodium 137 135 - 145 mEq/L   Potassium 4.1 3.5 - 5.1 mEq/L   Chloride 102 96 - 112 mEq/L   CO2 23 19 - 32 mEq/L   Glucose, Bld 94 70 - 99 mg/dL   BUN 17 6 - 23 mg/dL   Creatinine, Ser 0.5 0.4 - 1.2 mg/dL   Calcium 9.6 8.4 - 10.5 mg/dL   GFR 177.59 >60.00 mL/min    Assessment/Plan: Essential hypertension, benign Rx HCTZ 12.5  mg daily. DASH diet handout given and reviewed.  Follow-up in 4 weeks.

## 2014-03-17 NOTE — Progress Notes (Signed)
Pre visit review using our clinic review tool, if applicable. No additional management support is needed unless otherwise documented below in the visit note/SLS  

## 2014-03-17 NOTE — Assessment & Plan Note (Signed)
Rx HCTZ 12.5 mg daily. DASH diet handout given and reviewed.  Follow-up in 4 weeks.

## 2014-03-23 ENCOUNTER — Encounter (HOSPITAL_BASED_OUTPATIENT_CLINIC_OR_DEPARTMENT_OTHER): Payer: Self-pay | Admitting: Emergency Medicine

## 2014-03-23 ENCOUNTER — Emergency Department (HOSPITAL_BASED_OUTPATIENT_CLINIC_OR_DEPARTMENT_OTHER)
Admission: EM | Admit: 2014-03-23 | Discharge: 2014-03-23 | Disposition: A | Discharge status: Home / Self Care | Payer: BC Managed Care – PPO | Attending: Emergency Medicine | Admitting: Emergency Medicine

## 2014-03-23 DIAGNOSIS — Z8619 Personal history of other infectious and parasitic diseases: Secondary | ICD-10-CM | POA: Insufficient documentation

## 2014-03-23 DIAGNOSIS — M62441 Contracture of muscle, right hand: Secondary | ICD-10-CM | POA: Diagnosis not present

## 2014-03-23 DIAGNOSIS — Z79899 Other long term (current) drug therapy: Secondary | ICD-10-CM | POA: Insufficient documentation

## 2014-03-23 DIAGNOSIS — M79641 Pain in right hand: Secondary | ICD-10-CM | POA: Diagnosis present

## 2014-03-23 MED ORDER — BACLOFEN 10 MG PO TABS: ORAL_TABLET | ORAL | Status: DC

## 2014-03-23 NOTE — ED Provider Notes (Signed)
CSN: [id number]     Arrival date & time 03/23/14  1531 History  This chart was scribed for Rolan BuccoMelanie Eusebia Grulke, MD by Roxy Cedarhandni Bhalodia, ED Scribe. This patient was seen in room [facility] and the patient's care was started at 4:55 PM.   Chief Complaint  Patient presents with  . Spasms  . Hand Pain   Patient is a 63 y.o. female presenting with hand pain. The history is provided by the patient. No language interpreter was used.  Hand Pain Pertinent negatives include no chest pain, no abdominal pain, no headaches and no shortness of breath.   HPI Comments: Taylor Wood is a 63 y.o. female with a history of seizures, headache, who presents to the Emergency Department complaining of spasms and moderate pain in her right hand that began 2 weeks ago. She was admitted in late Sept for transverse myelitis and discharged on Oct 4th.  She had presented with right arm numbness and weakness as well as leg involvement.  She began having intermittent contractions of her right hand shortly after discharge.  Patient was seen by her outpt neurology, Dr. Everlena CooperJaffe and was started on baclofen about 2 weeks ago, but has had minimal relief. She denies and numbness or weakness to her arm.  She has had some mild weakness of her right leg, but has much improved since hospitalization.  She denies gait problems today.  She requests something else to help her with the contractions which sometimes occur about every hour.  Past Medical History  Diagnosis Date  . Seizure   . HA (headache)   . Viral encephalitis   . Viral meningitis    Past Surgical History  Procedure Laterality Date  . Unremarkable.     Family History  Problem Relation Age of Onset  . Hypertension Mother   . Heart disease Mother   . Diabetes Mother   . Hypertension Father   . Alzheimer's disease Father   . Diabetes Maternal Grandmother   . Hypertension Maternal Aunt   . Diabetes Maternal Aunt   . Hypertension Sister   . Healthy Brother     x3  . Healthy  Daughter     x4  . Seizures Daughter   . Healthy Son     x1   History  Substance Use Topics  . Smoking status: Never Smoker   . Smokeless tobacco: Never Used  . Alcohol Use: No   OB History    No data available     Review of Systems  Constitutional: Negative for fever, chills, diaphoresis and fatigue.  HENT: Negative for congestion, rhinorrhea and sneezing.   Eyes: Negative.   Respiratory: Negative for cough, chest tightness and shortness of breath.   Cardiovascular: Negative for chest pain and leg swelling.  Gastrointestinal: Negative for nausea, vomiting, abdominal pain, diarrhea and blood in stool.  Genitourinary: Negative for frequency, hematuria, flank pain and difficulty urinating.  Musculoskeletal: Negative for back pain, joint swelling, arthralgias and neck pain.       Muscle spasms to right hand  Skin: Negative for rash and wound.  Neurological: Negative for dizziness, speech difficulty, weakness, numbness and headaches.   Allergies  Review of patient's allergies indicates no known allergies.  Home Medications   Prior to Admission medications   Medication Sig Start Date End Date Taking? Authorizing Provider  hydrochlorothiazide (HYDRODIURIL) 12.5 MG tablet Take 1 tablet (12.5 mg total) by mouth daily. 03/17/14  Yes Waldon MerlWilliam C Martin, PA-C  pantoprazole (PROTONIX) 40 MG tablet Take 1  tablet (40 mg total) by mouth daily at 6 (six) AM. 03/17/14  Yes Waldon Merl, PA-C  baclofen (LIORESAL) 10 MG tablet Take 1 tab every 8 hours as needed.  If this is not working after 3-4 days, may increase to 1.5 tablets every 8 hours as needed for spasms. 03/23/14   Rolan Bucco, MD   Triage Vitals: BP 155/63 mmHg  Pulse 74  Temp(Src) 98.3 F (36.8 C) (Oral)  Resp 18  Ht 5\' 1"  (1.549 m)  Wt 112 lb (50.803 kg)  BMI 21.17 kg/m2  SpO2 97%  Physical Exam  Constitutional: She is oriented to person, place, and time. She appears well-developed and well-nourished.  HENT:  Head:  Normocephalic and atraumatic.  Neck: Normal range of motion. Neck supple.  Cardiovascular: Normal rate.   Pulmonary/Chest: Effort normal.  Musculoskeletal: She exhibits no tenderness.  Pt with claw like contracture of right hand intermittently.  Radial pulse intact.  No pain on palpation of hand/wrist.  Neurological: She is alert and oriented to person, place, and time.  Normal grip strength, normal strength of upper arm muscles.  Minimal weakness to right leg.  Gait intact.  Normal sensation to LT all extremities  Skin: Skin is warm and dry.  Psychiatric: She has a normal mood and affect.   ED Course  Procedures (including critical care time)  DIAGNOSTIC STUDIES: Oxygen Saturation is 97% on RA, normal by my interpretation.    COORDINATION OF CARE: 5:37 PM- Pt advised of plan for treatment and pt agrees.  Labs Review Labs Reviewed - No data to display  Imaging Review No results found.   EKG Interpretation None     MDM   Final diagnoses:  Contracture of muscle of hand, right    Discussed with Dr. Thad Ranger who recommends slowly increasing baclofen.  Pt is currently taking 1mg  twice daily.  I advised her to start taking 1mg  TID and if no improvement over next few days, may increase it to 1.5mg  TID.  Advised her to contact Dr. Moises Blood office tomorrow regarding ongoing treatment.  I personally performed the services described in this documentation, which was scribed in my presence. The recorded information has been reviewed and is accurate.  Rolan Bucco, MD 03/23/14 [id number]

## 2014-03-23 NOTE — Discharge Instructions (Signed)

## 2014-03-23 NOTE — ED Notes (Signed)
Pt presents to ED with complaints of spasms and pain in her right hand for the past 2 weeks.

## 2014-03-26 ENCOUNTER — Ambulatory Visit: Payer: BC Managed Care – PPO | Admitting: Physician Assistant

## 2014-04-16 ENCOUNTER — Encounter: Payer: Self-pay | Admitting: Physician Assistant

## 2014-04-16 ENCOUNTER — Ambulatory Visit (INDEPENDENT_AMBULATORY_CARE_PROVIDER_SITE_OTHER): Payer: BC Managed Care – PPO | Admitting: Physician Assistant

## 2014-04-16 VITALS — BP 159/79 | HR 100 | Temp 98.0°F | Wt 111.0 lb

## 2014-04-16 DIAGNOSIS — I10 Essential (primary) hypertension: Secondary | ICD-10-CM

## 2014-04-16 DIAGNOSIS — R29898 Other symptoms and signs involving the musculoskeletal system: Secondary | ICD-10-CM

## 2014-04-16 DIAGNOSIS — G0489 Other myelitis: Secondary | ICD-10-CM

## 2014-04-16 DIAGNOSIS — G373 Acute transverse myelitis in demyelinating disease of central nervous system: Secondary | ICD-10-CM

## 2014-04-16 MED ORDER — HYDROCHLOROTHIAZIDE 12.5 MG PO TABS: 25.0000 mg | ORAL_TABLET | Freq: Every day | ORAL | Status: DC

## 2014-04-16 MED ORDER — BACLOFEN 20 MG PO TABS: 20.0000 mg | ORAL_TABLET | Freq: Three times a day (TID) | ORAL | Status: DC

## 2014-04-16 NOTE — Progress Notes (Signed)
Patient presents to clinic today for follow-up of Hypertension after beginning HCTZ 12.5 mg daily.  Patient endorses taking medications as directed.  Denies chest pain, palpitations, lightheadedness or dizziness. BP above goal today in clinic.   Patient also complains of continued spasms of R hand despite Rx for Baclofen given by specialist.  Is taking 10 mg TID with mild relief in symptoms.   Past Medical History  Diagnosis Date  . Seizure   . HA (headache)   . Viral encephalitis   . Viral meningitis     Current Outpatient Prescriptions on File Prior to Visit  Medication Sig Dispense Refill  . pantoprazole (PROTONIX) 40 MG tablet Take 1 tablet (40 mg total) by mouth daily at 6 (six) AM. 30 tablet 0   No current facility-administered medications on file prior to visit.    No Known Allergies  Family History  Problem Relation Age of Onset  . Hypertension Mother   . Heart disease Mother   . Diabetes Mother   . Hypertension Father   . Alzheimer's disease Father   . Diabetes Maternal Grandmother   . Hypertension Maternal Aunt   . Diabetes Maternal Aunt   . Hypertension Sister   . Healthy Brother     x3  . Healthy Daughter     x4  . Seizures Daughter   . Healthy Son     x1    History   Social History  . Marital Status: Single    Spouse Name: N/A    Number of Children: N/A  . Years of Education: N/A   Social History Main Topics  . Smoking status: Never Smoker   . Smokeless tobacco: Never Used  . Alcohol Use: No  . Drug Use: No  . Sexual Activity: No   Other Topics Concern  . None   Social History Narrative    Review of Systems - See HPI.  All other ROS are negative.  BP 159/79 mmHg  Pulse 100  Temp(Src) 98 F (36.7 C)  Wt 111 lb (50.349 kg)  SpO2 97%  Physical Exam  Constitutional: She is oriented to person, place, and time and well-developed, well-nourished, and in no distress.  HENT:  Head: Normocephalic and atraumatic.  Cardiovascular:  Normal rate, regular rhythm, normal heart sounds and intact distal pulses.   Pulmonary/Chest: Effort normal and breath sounds normal. No respiratory distress. She has no wheezes. She has no rales. She exhibits no tenderness.  Neurological: She is alert and oriented to person, place, and time. She has normal reflexes.  Skin: Skin is warm and dry. No rash noted.  Psychiatric: Affect normal.  Vitals reviewed.   Recent Results (from the past 2160 hour(s))  CBC with Differential     Status: Abnormal   Collection Time: 02/04/14  6:25 PM  Result Value Ref Range   WBC 4.8 4.0 - 10.5 K/uL   RBC 3.71 (L) 3.87 - 5.11 MIL/uL   Hemoglobin 11.3 (L) 12.0 - 15.0 g/dL   HCT 33.4 (L) 36.0 - 46.0 %   MCV 90.0 78.0 - 100.0 fL   MCH 30.5 26.0 - 34.0 pg   MCHC 33.8 30.0 - 36.0 g/dL   RDW 13.0 11.5 - 15.5 %   Platelets 280 150 - 400 K/uL   Neutrophils Relative % 53 43 - 77 %   Neutro Abs 2.5 1.7 - 7.7 K/uL   Lymphocytes Relative 34 12 - 46 %   Lymphs Abs 1.7 0.7 - 4.0 K/uL  Monocytes Relative 12 3 - 12 %   Monocytes Absolute 0.6 0.1 - 1.0 K/uL   Eosinophils Relative 1 0 - 5 %   Eosinophils Absolute 0.0 0.0 - 0.7 K/uL   Basophils Relative 0 0 - 1 %   Basophils Absolute 0.0 0.0 - 0.1 K/uL  Basic metabolic panel     Status: Abnormal   Collection Time: 02/04/14  6:25 PM  Result Value Ref Range   Sodium 140 137 - 147 mEq/L   Potassium 3.1 (L) 3.7 - 5.3 mEq/L   Chloride 100 96 - 112 mEq/L   CO2 25 19 - 32 mEq/L   Glucose, Bld 119 (H) 70 - 99 mg/dL   BUN 10 6 - 23 mg/dL   Creatinine, Ser 0.40 (L) 0.50 - 1.10 mg/dL   Calcium 9.2 8.4 - 10.5 mg/dL   GFR calc non Af Amer >90 >90 mL/min   GFR calc Af Amer >90 >90 mL/min    Comment: (NOTE) The eGFR has been calculated using the CKD EPI equation. This calculation has not been validated in all clinical situations. eGFR's persistently <90 mL/min signify possible Chronic Kidney Disease.   Anion gap 15 5 - 15  Lipid panel     Status: Abnormal   Collection  Time: 02/05/14 11:45 AM  Result Value Ref Range   Cholesterol 228 (H) 0 - 200 mg/dL   Triglycerides 35 <150 mg/dL   HDL 78 >39 mg/dL   Total CHOL/HDL Ratio 2.9 RATIO   VLDL 7 0 - 40 mg/dL   LDL Cholesterol 143 (H) 0 - 99 mg/dL    Comment:        Total Cholesterol/HDL:CHD Risk Coronary Heart Disease Risk Table                     Men   Women  1/2 Average Risk   3.4   3.3  Average Risk       5.0   4.4  2 X Average Risk   9.6   7.1  3 X Average Risk  23.4   11.0        Use the calculated Patient Ratio above and the CHD Risk Table to determine the patient's CHD Risk.        ATP III CLASSIFICATION (LDL):  <100     mg/dL   Optimal  100-129  mg/dL   Near or Above                    Optimal  130-159  mg/dL   Borderline  160-189  mg/dL   High  >190     mg/dL   Very High  Hemoglobin A1c     Status: Abnormal   Collection Time: 02/05/14 11:45 AM  Result Value Ref Range   Hgb A1c MFr Bld 6.3 (H) <5.7 %    Comment: (NOTE)                                                                       According to the ADA Clinical Practice Recommendations for 2011, when HbA1c is used as a screening test:  >=6.5%   Diagnostic of Diabetes Mellitus           (  if abnormal result is confirmed) 5.7-6.4%   Increased risk of developing Diabetes Mellitus References:Diagnosis and Classification of Diabetes Mellitus,Diabetes EKCM,0349,17(HXTAV 1):S62-S69 and Standards of Medical Care in         Diabetes - 2011,Diabetes WPVX,4801,65 (Suppl 1):S11-S61.   Mean Plasma Glucose 134 (H) <117 mg/dL    Comment: Performed at [facility]  Urine rapid drug screen (hosp performed)     Status: None   Collection Time: 02/05/14  2:30 PM  Result Value Ref Range   Opiates NONE DETECTED NONE DETECTED   Cocaine NONE DETECTED NONE DETECTED   Benzodiazepines NONE DETECTED NONE DETECTED   Amphetamines NONE DETECTED NONE DETECTED   Tetrahydrocannabinol NONE DETECTED NONE DETECTED   Barbiturates NONE DETECTED NONE  DETECTED    Comment:        DRUG SCREEN FOR MEDICAL PURPOSES ONLY.  IF CONFIRMATION IS NEEDED FOR ANY PURPOSE, NOTIFY LAB WITHIN 5 DAYS.        LOWEST DETECTABLE LIMITS FOR URINE DRUG SCREEN Drug Class       Cutoff (ng/mL) Amphetamine      1000 Barbiturate      200 Benzodiazepine   537 Tricyclics       482 Opiates          300 Cocaine          300 THC              50  Neuromyelitis optica autoab, IgG     Status: None   Collection Time: 02/05/14  9:31 PM  Result Value Ref Range   NMO-IgG POSITIVE NEGATIVE    Comment: (NOTE) FLAG:  H  This autoantibody supports the diagnosis of neuromyelitis   optica or a neuromyelitis optica spectrum disorder.   Seropositivity predicts high risk for relapse of myelitis,   optic neuritis or both.  Performed at [facility]  ANA     Status: Abnormal   Collection Time: 02/05/14  9:31 PM  Result Value Ref Range   ANA Ser Ql POSITIVE (A) NEGATIVE    Comment: Performed at [place] extractable nuclear antibody     Status: None   Collection Time: 02/05/14  9:31 PM  Result Value Ref Range   SSA (Ro) (ENA) Antibody, IgG >8.0 POS <1.0 NEG AI    Comment: Performed at [place] syndrome-B extractable nuclear antibody     Status: None   Collection Time: 02/05/14  9:31 PM  Result Value Ref Range   SSB (La) (ENA) Antibody, IgG <1.0 NEG <1.0 NEG AI    Comment: Performed at [facility]  Angiotensin converting enzyme     Status: None   Collection Time: 02/05/14  9:31 PM  Result Value Ref Range   Angiotensin-Converting Enzyme 20 8 - 52 U/L    Comment: Performed at [facility]  HIV-1 RNA, Qualitative, TMA     Status: None   Collection Time: 02/05/14  9:31 PM  Result Value Ref Range   HIV-1 RNA, Qualitative, TMA Not Detected Not Detected    Comment: (NOTE) This test was performed using the APTIMA(R) HIV-RNA Qualitative Assay (Gen-Probe). Performed at [facility]  HIV  antibody     Status: None   Collection Time: 02/05/14  9:31 PM  Result Value Ref Range   HIV 1&2 Ab, 4th Generation NONREACTIVE NONREACTIVE    Comment: (NOTE) A NONREACTIVE HIV Ag/Ab result does not exclude HIV infection since the time frame for seroconversion is  variable. If acute HIV infection is suspected, a HIV-1 RNA Qualitative TMA test is recommended. HIV-1/2 Antibody Diff         Not indicated. HIV-1 RNA, Qual TMA           Not indicated. PLEASE NOTE: This information has been disclosed to you from records whose confidentiality may be protected by state law. If your state requires such protection, then the state law prohibits you from making any further disclosure of the information without the specific written consent of the person to whom it pertains, or as otherwise permitted by law. A general authorization for the release of medical or other information is NOT sufficient for this purpose. The performance of this assay has not been clinically validated in patients less than 10 years old. Performed at [facility]  Sedimentation rate     Status: Abnormal   Collection Time: 02/05/14  9:31 PM  Result Value Ref Range   Sed Rate 54 (H) 0 - 22 mm/hr  C-reactive protein     Status: Abnormal   Collection Time: 02/05/14  9:31 PM  Result Value Ref Range   CRP <0.5 (L) <0.60 mg/dL    Comment: Performed at [facility]  Rheumatoid factor     Status: None   Collection Time: 02/05/14  9:31 PM  Result Value Ref Range   Rhuematoid fact SerPl-aCnc <10 <=14 IU/mL    Comment: (NOTE)                         Interpretive Table                    Low Positive: 15 - 41 IU/mL                    High Positive:  >= 42 IU/mL In addition to the RF result, and clinical symptoms including joint involvement, the 2010 ACR Classification Criteria for scoring/diagnosing Rheumatoid Arthritis include the results of the following tests:  CRP (30092), ESR (15010), and CCP (APCA)  (33007). www.rheumatology.org/practice/clinical/classification/ra/ra_2010.asp Performed at [place] ab-titer (ANA titer)     Status: Abnormal   Collection Time: 02/05/14  9:31 PM  Result Value Ref Range   ANA Titer 1 1:160 (H) <1:40    Comment: (NOTE) Reference Ranges: 1:40 - 1:80 Weakly positive, usually not clinically significant. > or = to 1:160 Result may be clinically significant.                                                                         ANA Pattern 1 HOMOGENOUS     Comment: Performed at [facility]  Comprehensive metabolic panel     Status: Abnormal   Collection Time: 02/06/14  3:35 PM  Result Value Ref Range   Sodium 142 137 - 147 mEq/L   Potassium 4.0 3.7 - 5.3 mEq/L   Chloride 102 96 - 112 mEq/L   CO2 26 19 - 32 mEq/L   Glucose, Bld 180 (H) 70 - 99 mg/dL   BUN 16 6 - 23 mg/dL   Creatinine, Ser 0.48 (L) 0.50 - 1.10 mg/dL   Calcium 9.6 8.4 - 10.5 mg/dL  Total Protein 7.9 6.0 - 8.3 g/dL   Albumin 3.5 3.5 - 5.2 g/dL   AST 18 0 - 37 U/L   ALT 13 0 - 35 U/L   Alkaline Phosphatase 72 39 - 117 U/L   Total Bilirubin <0.2 (L) 0.3 - 1.2 mg/dL   GFR calc non Af Amer >90 >90 mL/min   GFR calc Af Amer >90 >90 mL/min    Comment: (NOTE) The eGFR has been calculated using the CKD EPI equation. This calculation has not been validated in all clinical situations. eGFR's persistently <90 mL/min signify possible Chronic Kidney Disease.   Anion gap 14 5 - 15  B. burgdorfi antibodies, CSF     Status: None   Collection Time: 02/06/14  7:18 PM  Result Value Ref Range   Lyme Ab SCREENED NEGATIVE     Comment: (NOTE) The complete series of assays could not be performed because the sample was negative for Lyme antibodies. CONTROL AB INDEX: TNP - TEST NOT REQUIRED ALBUMIN RATIO:    TNP - TEST NOT REQUIRED INTERPRETATION:   TNP - TEST NOT REQUIRED REFERENCE RANGES: LYME DISEASE ANTIBODY INDEX:    < or = 1.0 Negative     1.1 - 1.2  Equivocal    > or = 1.3 Positive CONTROL ANTIBODY INDEX:    < or = 1.0 ALBUMIN RATIO:    <0.0078 The Lyme Disease Antibody Index is used as an aid for the diagnosis of neuroborreliosis. An increased Lyme Disease Antibody Index (>1.2), accompanied by a Control Antibody Index of less than 1.0 and an Albumin Ratio of less than 0.0078, is strong evidence for intrathecal synthesis of organism-specific antibody, and thus CNS B. burgdorferi infection. Elevation of either the control antibody index, the Albumin Ratio, or both may indicate leakage of antibody across the blood-brain barrier which may falsely elevate the Lyme Disease Antibody Index. Performed at [facility]  CSF IgG index     Status: None   Collection Time: 02/06/14  7:18 PM  Result Value Ref Range   IgG, CSF 5.2 0.8 - 7.7 mg/dL    Comment: Performed at [place] and glucose, CSF     Status: Abnormal   Collection Time: 02/06/14  7:18 PM  Result Value Ref Range   Glucose, CSF 88 (H) 43 - 76 mg/dL   Total  Protein, CSF 48 (H) 15 - 45 mg/dL  VDRL, CSF     Status: None   Collection Time: 02/06/14  7:18 PM  Result Value Ref Range   VDRL Quant, CSF Nonreactive Nonreactive    Comment: Performed at [facility]  Enterovirus pcr     Status: None   Collection Time: 02/06/14  7:18 PM  Result Value Ref Range   Enterovirus PCR Not Detected Not Detected    Comment: (NOTE) This assay is designed to detect multiple strains of Enterovirus, including Enterovirus D68. This test was developed and its performance characteristics have been determined by [facility], [place], [place]. Performance characteristics refer to the analytical performance of the test. This test is performed pursuant to a license agreement with [facility]     Comment: Performed at [facility]  Herpes simplex virus(hsv) dna by pcr     Status: None   Collection  Time: 02/06/14  7:18 PM  Result Value Ref Range   Specimen source hsv CSF    HSV 1 DNA Not Detected Not Detected  HSV 2 DNA Not Detected Not Detected    Comment: (NOTE) Note: This assay detects the presence of herpes simplex virus (HSV) DNA by real-time polymerase chain reaction (PCR) amplification of the virus polymerase gene.  A result "DETECTED" is reported if a fluorescent signal for HSV DNA is detected. If HSV DNA is present, the virus is further characterized into subtype 1 or subtype 2 according type-specific differences in melting temperature. The assay is performed in the presence of an internal PCR control to ensure efficient sample extraction and the absence of PCR inhibitors in the sample. This test was developed and its analytical performance characteristics have been determined by [facility].  It has not been cleared or approved by the U.S. Food and Drug Administration.  The FDA has determined that such clearance or approval is not necessary. This assay has been validated pursuant to the CLIA regulations and is used for clinical purposes.  Performed at [facility] by PCR     Status: None   Collection Time: 02/06/14  7:18 PM  Result Value Ref Range   Source Varicella-Zoster, PCR CSF    Varicella-Zoster, PCR Not Detected Not Detected    Comment: (NOTE) This test was developed and its performance characteristics have been determined by [facility], [place], [place]. Performance characteristics refer to the analytical performance of the test. This test is performed pursuant to a license agreement with [facility]. Performed at [facility]  Cytomegalovirus PCR, qualitative     Status: None   Collection Time: 02/06/14  7:18 PM  Result Value Ref Range   Specimen Source CMVPCR CSF    Cytomegalovirus DNA NOT DETECTED     Comment: (NOTE) Reference range:  Not Detected This test was  developed and its performance characteristics have been determined by [facility], [place], [place]. Performance characteristics refer to the analytical performance of the test. Performed at [facility] Ab, IgG+IgM, CSF     Status: None   Collection Time: 02/06/14  7:18 PM  Result Value Ref Range   West Nile Ab, IgG, CSF <1.30 <1.30 index   West Nile Ab, IgM, CSF <0.90 <0.90 index    Comment: (NOTE) Interpretive Criteria: IgG  <1.30     Antibody not detected     1.30-1.49 Equivocal     >=1.50    Antibody detected IgM  <0.90     Antibody not detected     0.90-1.10 Equivocal     >1.10     Antibody detected West Nile virus (WNV) IgM is usually detectable in CSF from WNV-infected patients with encephalitis or meningitis at the time of clinical presentation. Because IgM antibody does not readily cross the blood- brain barrier, IgM antibody in CSF strongly suggests acute central nervous system infection. WNV antibody results from CSF should be interpreted with caution. Possible complicating factors include low levels of antibody found in CSF, passive transfer of antibodies from blood, and contamination via bloody spinal taps. Antibodies induced by other flavivirus infections (e.g. Dengue virus, [place] encephalitis virus) may show cross-reactivity with WNV. Performed at [facility]  CSF cell count with differential     Status: Abnormal   Collection Time: 02/06/14  7:20 PM  Result Value Ref Range   Tube # 1    Color, CSF COLORLESS COLORLESS   Appearance, CSF CLEAR CLEAR   Supernatant NOT INDICATED    RBC Count, CSF 1 (H) 0 /cu  mm   WBC, CSF 54 (HH) 0 - 5 /cu mm    Comment: CRITICAL RESULT CALLED TO, READ BACK BY AND VERIFIED WITH: E. MESSER RN [phone] GREEN R   Lymphs, CSF 90 (H) 40 - 80 %   Monocyte-Macrophage-Spinal Fluid 10 (L) 15 - 45 %  CSF culture     Status: None   Collection Time: 02/06/14  7:20 PM  Result Value  Ref Range   Specimen Description CSF    Special Requests NO 4 4CC    Gram Stain      CYTSOSPIN SLIDE WBC PRESENT, PREDOMINANTLY MONONUCLEAR NO ORGANISMS SEEN Performed at [facility] Performed at [place] 3 DAYS Performed at [facility]   Report Status 03/07/2014 FINAL   Gram stain     Status: None   Collection Time: 02/06/14  7:20 PM  Result Value Ref Range   Specimen Description CSF    Special Requests NONE    Gram Stain      WBC PRESENT, PREDOMINANTLY MONONUCLEAR NO ORGANISMS SEEN CYTOSPIN SLIDE   Report Status 02/06/2014 FINAL   Pathologist smear review     Status: None   Collection Time: 02/06/14  7:20 PM  Result Value Ref Range   Path Review LYMPHOCYTOSIS.     Comment: Reviewed by Chrystie Nose. Saralyn Pilar, M.D. 02/07/2014.  Oligoclonal bands, CSF + serm     Status: None   Collection Time: 02/06/14  7:21 PM  Result Value Ref Range   CSF Oligoclonal Bands REPORT     Comment: (NOTE) **Bands noted**        Reference Range: No bands One Gamma restriction band is present in the patient's CSF that lines up with the one gamma restriction band present in the patient's cor- responding serum sample.  This electrophoretic pattern is suggestive of a monoclonal gammopathy. Clinical correlation is recommended for best interpretation. Oligoclonal bands are present in the CSF of more than 85% of patients with clinically definite multiple sclerosis (MS). To distinguish between oligoclonal bands in the CSF due to a peripheral gammopathy and oligoclonal bands due to local production in the CNS, serum and CSF should be tested simultaneously. Oligoclonal bands can however be observed in a variety of other diseases, e.g., subacute sclerosing panen- cephalitis, inflammatory polyneuropathy, CNS lupus, and brain tumors and infarctions. The clinical significance of a numerical band count, determined by isoelectric focusing, has not been  definitively defined. The data should be interpreted in conjunction with all pertinent clinical and laboratory data for this patient. Performed at [facility]  Jo-1 antibody-IgG     Status: None   Collection Time: 02/06/14  9:57 PM  Result Value Ref Range   Jo-1 Antibody, IgG <1.0 NEG <1.0 NEG AI    Comment: Performed at [place] antibody     Status: None   Collection Time: 02/06/14  9:57 PM  Result Value Ref Range   Scleroderma (Scl-70) (ENA) Antibody, IgG <1.0 NEG <1.0 NEG AI    Comment: Performed at [facility]  Anti-DNA antibody, double-stranded     Status: None   Collection Time: 02/06/14  9:57 PM  Result Value Ref Range   ds DNA Ab 2 IU/mL    Comment: (NOTE)                              IU/mL  Interpretation                              < or = 4    Negative                              5-9         Indeterminate                              > or = 10   Positive Performed at [facility]  Anti-Smith antibody     Status: None   Collection Time: 02/06/14  9:57 PM  Result Value Ref Range   ENA SM Ab Ser-aCnc <1.0 NEG <1.0 NEG AI    Comment: Performed at [facility]  CBC     Status: Abnormal   Collection Time: 02/07/14  5:22 AM  Result Value Ref Range   WBC 7.2 4.0 - 10.5 K/uL   RBC 3.85 (L) 3.87 - 5.11 MIL/uL   Hemoglobin 11.8 (L) 12.0 - 15.0 g/dL   HCT 34.8 (L) 36.0 - 46.0 %   MCV 90.4 78.0 - 100.0 fL   MCH 30.6 26.0 - 34.0 pg   MCHC 33.9 30.0 - 36.0 g/dL   RDW 14.0 11.5 - 15.5 %   Platelets 324 150 - 400 K/uL  Basic metabolic panel     Status: Abnormal   Collection Time: 02/07/14  5:22 AM  Result Value Ref Range   Sodium 138 137 - 147 mEq/L   Potassium 4.5 3.7 - 5.3 mEq/L   Chloride 100 96 - 112 mEq/L   CO2 24 19 - 32 mEq/L   Glucose, Bld 142 (H) 70 - 99 mg/dL   BUN 14 6 - 23 mg/dL   Creatinine, Ser 0.42 (L) 0.50 - 1.10 mg/dL   Calcium 9.6 8.4 - 10.5 mg/dL   GFR calc non Af Amer >90 >90  mL/min   GFR calc Af Amer >90 >90 mL/min    Comment: (NOTE) The eGFR has been calculated using the CKD EPI equation. This calculation has not been validated in all clinical situations. eGFR's persistently <90 mL/min signify possible Chronic Kidney Disease.   Anion gap 14 5 - 15  CBC     Status: None   Collection Time: 02/19/14  2:24 PM  Result Value Ref Range   WBC 4.3 4.0 - 10.5 K/uL   RBC 4.13 3.87 - 5.11 Mil/uL   Platelets 246.0 150.0 - 400.0 K/uL   Hemoglobin 12.6 12.0 - 15.0 g/dL   HCT 38.0 36.0 - 46.0 %   MCV 92.2 78.0 - 100.0 fl   MCHC 33.0 30.0 - 36.0 g/dL   RDW 14.9 11.5 - 34.1 %  Basic Metabolic Panel (BMET)     Status: None   Collection Time: 02/19/14  2:24 PM  Result Value Ref Range   Sodium 137 135 - 145 mEq/L   Potassium 4.1 3.5 - 5.1 mEq/L   Chloride 102 96 - 112 mEq/L   CO2 23 19 - 32 mEq/L   Glucose, Bld 94 70 - 99 mg/dL   BUN 17 6 - 23 mg/dL   Creatinine, Ser 0.5 0.4 - 1.2 mg/dL   Calcium 9.6 8.4 - 10.5 mg/dL   GFR 177.59 >60.00 mL/min    Assessment/Plan: Essential hypertension, benign Will increase HCTZ  to 25 mg daily.  Continue DASH diet.  Follow-up 2 months.  Transverse myelitis Patient awaiting appointment with MS specialist at [facility] given by Neurology.  No referral noted in system.  Will attempt to reach Neurologist to help get this moving for patient.    Right arm weakness With spasm and beginnings of contracture.  Increase Baclofen to 20 mg TID. Follow-up with Neurology as scheduled.

## 2014-04-16 NOTE — Patient Instructions (Signed)
Increase the HCTZ to 2 tablets (25 mg) daily.  Continue the DASH diet (see below).  For the spasms, start by increasing the Baclofen to 20 mg (2 tablets) three times daily.  Call if no improvement as we will possibly need to change medication.  I am sending a message to Dr. Everlena CooperJaffe regarding the referral to [facility].  Follow-up with me in 2 months.  DASH Eating Plan DASH stands for "Dietary Approaches to Stop Hypertension." The DASH eating plan is a healthy eating plan that has been shown to reduce high blood pressure (hypertension). Additional health benefits may include reducing the risk of type 2 diabetes mellitus, heart disease, and stroke. The DASH eating plan may also help with weight loss. WHAT DO I NEED TO KNOW ABOUT THE DASH EATING PLAN? For the DASH eating plan, you will follow these general guidelines:  Choose foods with a percent daily value for sodium of less than 5% (as listed on the food label).  Use salt-free seasonings or herbs instead of table salt or sea salt.  Check with your health care provider or pharmacist before using salt substitutes.  Eat lower-sodium products, often labeled as "lower sodium" or "no salt added."  Eat fresh foods.  Eat more vegetables, fruits, and low-fat dairy products.  Choose whole grains. Look for the word "whole" as the first word in the ingredient list.  Choose fish and skinless chicken or Malawiturkey more often than red meat. Limit fish, poultry, and meat to 6 oz (170 g) each day.  Limit sweets, desserts, sugars, and sugary drinks.  Choose heart-healthy fats.  Limit cheese to 1 oz (28 g) per day.  Eat more home-cooked food and less restaurant, buffet, and fast food.  Limit fried foods.  Cook foods using methods other than frying.  Limit canned vegetables. If you do use them, rinse them well to decrease the sodium.  When eating at a restaurant, ask that your food be prepared with less salt, or no salt if possible. WHAT FOODS CAN I  EAT? Seek help from a dietitian for individual calorie needs. Grains Whole grain or whole wheat bread. Brown rice. Whole grain or whole wheat pasta. Quinoa, bulgur, and whole grain cereals. Low-sodium cereals. Corn or whole wheat flour tortillas. Whole grain cornbread. Whole grain crackers. Low-sodium crackers. Vegetables Fresh or frozen vegetables (raw, steamed, roasted, or grilled). Low-sodium or reduced-sodium tomato and vegetable juices. Low-sodium or reduced-sodium tomato sauce and paste. Low-sodium or reduced-sodium canned vegetables.  Fruits All fresh, canned (in natural juice), or frozen fruits. Meat and Other Protein Products Ground beef (85% or leaner), grass-fed beef, or beef trimmed of fat. Skinless chicken or Malaw[place]urkey. Ground chicken or Mal[place]urkey. Pork trimmed of fat. All fish and seafood. Eggs. Dried beans, peas, or lentils. Unsalted nuts and seeds. Unsalted canned beans. Dairy Low-fat dairy products, such as skim or 1% milk, 2% or reduced-fat cheeses, low-fat ricotta or cottage cheese, or plain low-fat yogurt. Low-sodium or reduced-sodium cheeses. Fats and Oils Tub margarines without trans fats. Light or reduced-fat mayonnaise and salad dressings (reduced sodium). Avocado. Safflower, olive, or canola oils. Natural peanut or almond butter. Other Unsalted popcorn and pretzels. The items listed above may not be a complete list of recommended foods or beverages. Contact your dietitian for more options. WHAT FOODS ARE NOT RECOMMENDED? Grains White bread. White pasta. White rice. Refined cornbread. Bagels and croissants. Crackers that contain trans fat. Vegetables Creamed or fried vegetables. Vegetables in a cheese sauce. Regular canned vegetables. Regular canned tomato  sauce and paste. Regular tomato and vegetable juices. Fruits Dried fruits. Canned fruit in light or heavy syrup. Fruit juice. Meat and Other Protein Products Fatty cuts of meat. Ribs, chicken wings, bacon, sausage,  bologna, salami, chitterlings, fatback, hot dogs, bratwurst, and packaged luncheon meats. Salted nuts and seeds. Canned beans with salt. Dairy Whole or 2% milk, cream, half-and-half, and cream cheese. Whole-fat or sweetened yogurt. Full-fat cheeses or blue cheese. Nondairy creamers and whipped toppings. Processed cheese, cheese spreads, or cheese curds. Condiments Onion and garlic salt, seasoned salt, table salt, and sea salt. Canned and packaged gravies. Worcestershire sauce. Tartar sauce. Barbecue sauce. Teriyaki sauce. Soy sauce, including reduced sodium. Steak sauce. Fish sauce. Oyster sauce. Cocktail sauce. Horseradish. Ketchup and mustard. Meat flavorings and tenderizers. Bouillon cubes. Hot sauce. Tabasco sauce. Marinades. Taco seasonings. Relishes. Fats and Oils Butter, stick margarine, lard, shortening, ghee, and bacon fat. Coconut, palm kernel, or palm oils. Regular salad dressings. Other Pickles and olives. Salted popcorn and pretzels. The items listed above may not be a complete list of foods and beverages to avoid. Contact your dietitian for more information. WHERE CAN I FIND MORE INFORMATION? National Heart, Lung, and Blood Institute: [employer] Document Released: 04/14/2011 Document Revised: 09/09/2013 Document Reviewed: 02/27/2013 [facility] Patient Information 2015 [place], [place]. This information is not intended to replace advice given to you by your health care provider. Make sure you discuss any questions you have with your health care provider.

## 2014-04-16 NOTE — Progress Notes (Signed)
Pre visit review using our clinic review tool, if applicable. No additional management support is needed unless otherwise documented below in the visit note. 

## 2014-04-16 NOTE — Assessment & Plan Note (Signed)
With spasm and beginnings of contracture.  Increase Baclofen to 20 mg TID. Follow-up with Neurology as scheduled.

## 2014-04-16 NOTE — Assessment & Plan Note (Signed)
Patient awaiting appointment with MS specialist at [facility] given by Neurology.  No referral noted in system.  Will attempt to reach Neurologist to help get this moving for patient.

## 2014-04-16 NOTE — Assessment & Plan Note (Signed)
Will increase HCTZ to 25 mg daily.  Continue DASH diet.  Follow-up 2 months.

## 2014-04-24 ENCOUNTER — Other Ambulatory Visit: Payer: Self-pay | Admitting: *Deleted

## 2014-04-24 ENCOUNTER — Telehealth: Payer: Self-pay | Admitting: *Deleted

## 2014-04-24 DIAGNOSIS — G35 Multiple sclerosis: Secondary | ICD-10-CM

## 2014-04-24 NOTE — Telephone Encounter (Signed)
Patient has appt  with Dr Renne Crigler 10/01/14 at 9am in [place] at [facility] if this is not soon enough please let me know

## 2014-04-24 NOTE — Telephone Encounter (Signed)
For patient to call office regarding appt  with Dr Renne Crigler 10/01/14 at 9am in [place] at [facility] clinic

## 2014-04-25 ENCOUNTER — Telehealth: Payer: Self-pay | Admitting: Neurology

## 2014-04-25 NOTE — Telephone Encounter (Signed)
I think she should be seen sooner.  We can try Dr. Leilani MerlSader. [facility] soon.

## 2014-04-25 NOTE — Telephone Encounter (Signed)
They did offer a jan with a Dr that I already had her scheduled with but she did not show up . Dr Leilani MerlSader  is booked out until Feb please advise

## 2014-04-25 NOTE — Telephone Encounter (Signed)
February is better than May.  I would set her up with him and have her on the cancellation list.

## 2014-04-25 NOTE — Telephone Encounter (Signed)
Taylor Wood called and needs to know about appt in [place] for the patient  Please leave the information on her voice mail so she can get the patient to the appt

## 2014-04-28 NOTE — Telephone Encounter (Signed)
Patient has appt at the [facility] 06/25/13 at 2pm she is aware  [phone] [place] was scheduler

## 2014-04-30 ENCOUNTER — Telehealth: Payer: Self-pay | Admitting: Physician Assistant

## 2014-04-30 ENCOUNTER — Telehealth: Payer: Self-pay | Admitting: *Deleted

## 2014-04-30 ENCOUNTER — Telehealth: Payer: Self-pay | Admitting: Neurology

## 2014-04-30 DIAGNOSIS — I10 Essential (primary) hypertension: Secondary | ICD-10-CM

## 2014-04-30 MED ORDER — HYDROCHLOROTHIAZIDE 12.5 MG PO TABS: 25.0000 mg | ORAL_TABLET | Freq: Every day | ORAL | Status: DC

## 2014-04-30 NOTE — Telephone Encounter (Signed)
Caller name:[place], Amil Amen Relation to FG:HWEX Call back [phone] Pharmacy:[facility] [address]   Reason for call: pharmacy states they never received the rx for hydrochlorothiazide (HYDRODIURIL) 12.5 MG tablet

## 2014-04-30 NOTE — Telephone Encounter (Signed)
Per patient we were to call in this medication hydrochlorochiazde to [facility] pt telephone [phone]

## 2014-04-30 NOTE — Telephone Encounter (Signed)
Patient  states incorrect Dr call  not for DR Everlena Cooper

## 2014-04-30 NOTE — Telephone Encounter (Signed)
Medication Detail      Disp Refills Start End     hydrochlorothiazide (HYDRODIURIL) 12.5 MG tablet 60 tablet 3 04/16/2014     Sig - Route: Take 2 tablets (25 mg total) by mouth daily. - Oral    E-Prescribing Status: Receipt confirmed by pharmacy (04/16/2014 11:08 AM EST)     Associated Diagnoses    Essential hypertension, benign - Primary       Pharmacy    [facility] [phone] - [place], [place] - [address]   **RESEND OF 12.09.15 PRESCRIPTION DUE TO PATIENT REPORTING PHARMACY NEVER RECEIVED**

## 2014-05-14 ENCOUNTER — Ambulatory Visit (INDEPENDENT_AMBULATORY_CARE_PROVIDER_SITE_OTHER): Payer: BLUE CROSS/BLUE SHIELD | Admitting: Neurology

## 2014-05-14 ENCOUNTER — Encounter: Payer: Self-pay | Admitting: Neurology

## 2014-05-14 VITALS — BP 118/60 | HR 68 | Temp 98.0°F | Resp 14 | Ht 61.0 in | Wt 112.9 lb

## 2014-05-14 DIAGNOSIS — Z8669 Personal history of other diseases of the nervous system and sense organs: Secondary | ICD-10-CM

## 2014-05-14 DIAGNOSIS — Z87898 Personal history of other specified conditions: Secondary | ICD-10-CM

## 2014-05-14 DIAGNOSIS — G030 Nonpyogenic meningitis: Secondary | ICD-10-CM

## 2014-05-14 DIAGNOSIS — G360 Neuromyelitis optica [Devic]: Secondary | ICD-10-CM

## 2014-05-14 MED ORDER — BACLOFEN 20 MG PO TABS: 20.0000 mg | ORAL_TABLET | Freq: Four times a day (QID) | ORAL | Status: DC

## 2014-05-14 NOTE — Patient Instructions (Addendum)
1.  I increased the baclofen to  FOUR times daily. 2.  Please follow up with the multiple sclerosis clinic at [facility] for further management.  In the meantime, please call with questions or concerns. 3.  Use a stress ball for your right hand.

## 2014-05-14 NOTE — Progress Notes (Signed)
NEUROLOGY FOLLOW UP OFFICE NOTE  Taylor Wood [id number]  HISTORY OF PRESENT ILLNESS: Taylor Wood is a 64 year old right-handed woman with history of chronic headaches who follows up for seizure in setting of aseptic meningitis.  She is accompanied by her daughter who provides some history.  ED notes from November reviewed.  UPDATE: For muscle spasms in the right upper back and spasticity and contracture in the right hand, she was started on baclofen.  Her right hand would cramp up into a claw position.  She is taking baclofen  three times daily.  She has upcoming appointment with the MS clinic at [facility] next month.  HISTORY: In January 2015 for an isolated seizure in the setting of aseptic meningitis, which occurred in September 2014.  At that time, she had a fever of 103.1.  CSF revealed protein 78, glucose 68, culture negative, WBC 108 and 163, neutrophils 6 and 8, lymphs 74 and 78, monocytes 20 and 14, no eosinophils and RBC 8.  She did not remain on anticonvulsant therapy, as it appeared to be provoked.  She was admitted to [facility] on 02/04/14 for transverse myelitis, after presenting for right arm and leg weakness, with a gradual onset of right arm pain and paresthesias over 2 weeks.  She had a temperature of 99.1.  MRI of the brain with and without contrast revealed remote right putaminal lacunar infarct and mild nonspecific punctate hyperintensities.  However, the cervical spine  and thoracic spine revealed T2 signal abnormality from C2 to T1 with associated cord edema, as well as asymmetric right-sided enhancement of the spinal cord from C3-4 through C6-7, extending 3.7 cm.  She underwent a lumbar puncture, which revealed CSF cell count of 54 with 90% lymphs, protein 48, glucose 88, gram stain with predominantly mononuclear WGC with no organisms, culture negative, 1 gamma restricted band, IgG index 5.2, VDRL non-reactive, varicella-zoster PCR negative, CMV negative, HSV 1 & 2  negative, enterovirus PCR negative, and West Nile IgG & IgM antibodies negative.  Blood work showed ANA 1:160 with homogenous pattern, sed rate 54, CRP negative, RF negative, HIV non-reactive, ACE 20, SSB antibodies negative, anti-scleroderma antibodies negative, ds DNA antibodies 2, anti-Smith antibodies negative, and anti-Jo antibodies negative.  However, SSA antibodies were positive and NMO-IgG was positive.  She was treated with IV Solumedrol.  Following admission, she continued to have right leg weakness and she has spasms and contracture in her right hand.  She is using a cane to help her ambulate.  She denies bowel or bladder dysfunction.  She denies visual disturbance.  She has not had any more seizures.  PAST MEDICAL HISTORY: Past Medical History  Diagnosis Date  . Seizure   . HA (headache)   . Viral encephalitis   . Viral meningitis     MEDICATIONS: Current Outpatient Prescriptions on File Prior to Visit  Medication Sig Dispense Refill  . hydrochlorothiazide (HYDRODIURIL) 12.5 MG tablet Take 2 tablets (25 mg total) by mouth daily. 60 tablet 3  . pantoprazole (PROTONIX) 40 MG tablet Take 1 tablet (40 mg total) by mouth daily at 6 (six) AM. 30 tablet 0   No current facility-administered medications on file prior to visit.    ALLERGIES: No Known Allergies  FAMILY HISTORY: Family History  Problem Relation Age of Onset  . Hypertension Mother   . Heart disease Mother   . Diabetes Mother   . Hypertension Father   . Alzheimer's disease Father   . Diabetes Maternal Grandmother   .  Hypertension Maternal Aunt   . Diabetes Maternal Aunt   . Hypertension Sister   . Healthy Brother     x3  . Healthy Daughter     x4  . Seizures Daughter   . Healthy Son     x1    SOCIAL HISTORY: History   Social History  . Marital Status: Single    Spouse Name: N/A    Number of Children: N/A  . Years of Education: N/A   Occupational History  . Not on file.   Social History Main  Topics  . Smoking status: Never Smoker   . Smokeless tobacco: Never Used  . Alcohol Use: No  . Drug Use: No  . Sexual Activity: No   Other Topics Concern  . Not on file   Social History Narrative    REVIEW OF SYSTEMS: Constitutional: No fevers, chills, or sweats, no generalized fatigue, change in appetite Eyes: No visual changes, double vision, eye pain Ear, nose and throat: No hearing loss, ear pain, nasal congestion, sore throat Cardiovascular: No chest pain, palpitations Respiratory:  No shortness of breath at rest or with exertion, wheezes GastrointestinaI: No nausea, vomiting, diarrhea, abdominal pain, fecal incontinence Genitourinary:  No dysuria, urinary retention or frequency Musculoskeletal:  Back pain Integumentary: No rash, pruritus, skin lesions Neurological: as above Psychiatric: No depression, insomnia, anxiety Endocrine: No palpitations, fatigue, diaphoresis, mood swings, change in appetite, change in weight, increased thirst Hematologic/Lymphatic:  No anemia, purpura, petechiae. Allergic/Immunologic: no itchy/runny eyes, nasal congestion, recent allergic reactions, rashes  PHYSICAL EXAM: Filed Vitals:   05/14/14 1142  BP: 118/60  Pulse: 68  Temp: 98 F (36.7 C)  Resp: 14   General: No acute distress Head:  Normocephalic/atraumatic Eyes:  Fundoscopic exam unremarkable without vessel changes, exudates, hemorrhages or papilledema. Neck: supple, no paraspinal tenderness, full range of motion Heart:  Regular rate and rhythm Lungs:  Clear to auscultation bilaterally Back: No paraspinal tenderness Neurological Exam: alert and oriented to person, place, and time. Attention span and concentration intact, recent and remote memory intact, fund of knowledge intact.  Speech fluent and not dysarthric, language intact.  CN II-XII intact. Fundoscopic exam unremarkable without vessel changes, exudates, hemorrhages or papilledema.  Bulk and tone normal but hand did cramp up  in claw when she developed a spasm, muscle strength 4-/5 interossei, 4-/5 finger flexion, 4/5 APB, 5-/5 biceps and 4-/5 pronator teres on the right.  Otherwise 5/5 throughout.  Sensation to light touch  intact.  Deep tendon reflexes 2+ throughout.  Finger to nose and heel to shin testing intact.  Gait normal, Romberg negative.  IMPRESSION: Neuromyelitis optica History of aseptic meningitis with subsequent seizure (no recurrent seizure)  PLAN: Increase baclofen to  four times daily Follow up with MS clinic at [facility] for further management. In the meantime, call with questions or concerns.  Shon Millet, DO  CC:  Marcelline Mates, PA-C

## 2014-05-15 ENCOUNTER — Other Ambulatory Visit: Payer: Self-pay | Admitting: Physician Assistant

## 2014-06-07 ENCOUNTER — Emergency Department (HOSPITAL_BASED_OUTPATIENT_CLINIC_OR_DEPARTMENT_OTHER)
Admission: EM | Admit: 2014-06-07 | Discharge: 2014-06-07 | Disposition: A | Discharge status: Home / Self Care | Payer: BLUE CROSS/BLUE SHIELD | Attending: Emergency Medicine | Admitting: Emergency Medicine

## 2014-06-07 ENCOUNTER — Encounter (HOSPITAL_BASED_OUTPATIENT_CLINIC_OR_DEPARTMENT_OTHER): Payer: Self-pay

## 2014-06-07 DIAGNOSIS — M62838 Other muscle spasm: Secondary | ICD-10-CM | POA: Insufficient documentation

## 2014-06-07 DIAGNOSIS — Z79899 Other long term (current) drug therapy: Secondary | ICD-10-CM | POA: Diagnosis not present

## 2014-06-07 DIAGNOSIS — I10 Essential (primary) hypertension: Secondary | ICD-10-CM | POA: Insufficient documentation

## 2014-06-07 DIAGNOSIS — Z8619 Personal history of other infectious and parasitic diseases: Secondary | ICD-10-CM | POA: Insufficient documentation

## 2014-06-07 DIAGNOSIS — M79641 Pain in right hand: Secondary | ICD-10-CM | POA: Diagnosis not present

## 2014-06-07 HISTORY — DX: Essential (primary) hypertension: I10

## 2014-06-07 NOTE — ED Notes (Addendum)
Pt reports 3 month history of right hand pain, muscle spasms, that has been intermittent. Denies known injury.

## 2014-06-07 NOTE — ED Provider Notes (Signed)
CSN: [id number]     Arrival date & time 06/07/14  1543 History   First MD Initiated Contact with Patient 06/07/14 1557     Chief Complaint  Patient presents with  . Hand Pain     (Consider location/radiation/quality/duration/timing/severity/associated sxs/prior Treatment) HPI  Arnesha Schiraldi is a 65 y.o. female complaining of intermittent right hand spasms lasting approximately minute over the course of last 3 months. Patient is right-hand-dominant, she does not recall any specific trauma. She has been taking baclofen at home with little relief. She denies weakness, numbness, radiation of pain into  fingers.  Past Medical History  Diagnosis Date  . Seizure   . HA (headache)   . Viral encephalitis   . Viral meningitis   . Hypertension    Past Surgical History  Procedure Laterality Date  . Unremarkable.     Family History  Problem Relation Age of Onset  . Hypertension Mother   . Heart disease Mother   . Diabetes Mother   . Hypertension Father   . Alzheimer's disease Father   . Diabetes Maternal Grandmother   . Hypertension Maternal Aunt   . Diabetes Maternal Aunt   . Hypertension Sister   . Healthy Brother     x3  . Healthy Daughter     x4  . Seizures Daughter   . Healthy Son     x1   History  Substance Use Topics  . Smoking status: Never Smoker   . Smokeless tobacco: Never Used  . Alcohol Use: No   OB History    No data available     Review of Systems  10 systems reviewed and found to be negative, except as noted in the HPI.   Allergies  Review of patient's allergies indicates no known allergies.  Home Medications   Prior to Admission medications   Medication Sig Start Date End Date Taking? Authorizing Provider  baclofen (LIORESAL) 20 MG tablet Take 1 tablet (20 mg total) by mouth 4 (four) times daily. Take 1 tab every 8 hours as needed. 05/14/14  Yes Adam Gus Rankin, DO  hydrochlorothiazide (HYDRODIURIL) 12.5 MG tablet Take 2 tablets (25 mg total) by  mouth daily. 04/30/14  Yes Waldon Merl, PA-C  pantoprazole (PROTONIX) 40 MG tablet TAKE 1 TABLET BY MOUTH DAILY AT 6 AM 05/15/14  Yes Waldon Merl, PA-C   BP 177/89 mmHg  Pulse 96  Temp(Src) 98.5 F (36.9 C) (Oral)  Resp 16  Ht  (1.549 m)  Wt 110 lb (49.896 kg)  BMI 20.80 kg/m2  SpO2 100% Physical Exam  Constitutional: She is oriented to person, place, and time. She appears well-developed and well-nourished. No distress.  HENT:  Head: Normocephalic.  Eyes: Conjunctivae and EOM are normal.  Cardiovascular: Normal rate.   Pulmonary/Chest: Effort normal. No stridor.  Musculoskeletal: Normal range of motion.  Right wrist with no deformity, full range of motion, tender now and Phalen are negative. There is extremely faint ulnar deviation bilaterally. She is distally neurovascularly intact.  Neurological: She is alert and oriented to person, place, and time.  Psychiatric: She has a normal mood and affect.  Nursing note and vitals reviewed.   ED Course  Procedures (including critical care time) Labs Review Labs Reviewed - No data to display  Imaging Review No results found.   EKG Interpretation None      MDM   Final diagnoses:  Right hand pain    Filed Vitals:   06/07/14 1554  BP: 177/89  Pulse: 96  Temp: 98.5 F (36.9 C)  TempSrc: Oral  Resp: 16  Height:  (1.549 m)  Weight: 110 lb (49.896 kg)  SpO2: 100%   Eulah Walkup is a pleasant 64 y.o. female presenting with intermittent spasms to the right hand lasting approximately 2 minutes.. Oh abnormalities seen on my exam. Will refer her to sports medicine for further evaluation.  Evaluation does not show pathology that would require ongoing emergent intervention or inpatient treatment. Pt is hemodynamically stable and mentating appropriately. Discussed findings and plan with patient/guardian, who agrees with care plan. All questions answered. Return precautions discussed and outpatient follow up given.       Wynetta Emery, PA-C 06/07/14 1608  Merrie Roof, MD 06/07/14 2115

## 2014-06-07 NOTE — Discharge Instructions (Signed)
Please follow with your primary care doctor in the next 2 days for a check-up. They must obtain records for further management.   Do not hesitate to return to the Emergency Department for any new, worsening or concerning symptoms.     Musculoskeletal Pain Musculoskeletal pain is muscle and boney aches and pains. These pains can occur in any part of the body. Your caregiver may treat you without knowing the cause of the pain. They may treat you if blood or urine tests, X-rays, and other tests were normal.  CAUSES There is often not a definite cause or reason for these pains. These pains may be caused by a type of germ (virus). The discomfort may also come from overuse. Overuse includes working out too hard when your body is not fit. Boney aches also come from weather changes. Bone is sensitive to atmospheric pressure changes. HOME CARE INSTRUCTIONS   Ask when your test results will be ready. Make sure you get your test results.  Only take over-the-counter or prescription medicines for pain, discomfort, or fever as directed by your caregiver. If you were given medications for your condition, do not drive, operate machinery or power tools, or sign legal documents for 24 hours. Do not drink alcohol. Do not take sleeping pills or other medications that may interfere with treatment.  Continue all activities unless the activities cause more pain. When the pain lessens, slowly resume normal activities. Gradually increase the intensity and duration of the activities or exercise.  During periods of severe pain, bed rest may be helpful. Lay or sit in any position that is comfortable.  Putting ice on the injured area.  Put ice in a bag.  Place a towel between your skin and the bag.  Leave the ice on for 15 to 20 minutes, 3 to 4 times a day.  Follow up with your caregiver for continued problems and no reason can be found for the pain. If the pain becomes worse or does not go away, it may be necessary  to repeat tests or do additional testing. Your caregiver may need to look further for a possible cause. SEEK IMMEDIATE MEDICAL CARE IF:  You have pain that is getting worse and is not relieved by medications.  You develop chest pain that is associated with shortness or breath, sweating, feeling sick to your stomach (nauseous), or throw up (vomit).  Your pain becomes localized to the abdomen.  You develop any new symptoms that seem different or that concern you. MAKE SURE YOU:   Understand these instructions.  Will watch your condition.  Will get help right away if you are not doing well or get worse. Document Released: 04/25/2005 Document Revised: 07/18/2011 Document Reviewed: 12/28/2012 [facility] Patient Information 2015 [place], [place]. This information is not intended to replace advice given to you by your health care provider. Make sure you discuss any questions you have with your health care provider.

## 2014-06-18 ENCOUNTER — Ambulatory Visit (INDEPENDENT_AMBULATORY_CARE_PROVIDER_SITE_OTHER): Payer: BLUE CROSS/BLUE SHIELD | Admitting: Physician Assistant

## 2014-06-18 ENCOUNTER — Encounter: Payer: Self-pay | Admitting: Physician Assistant

## 2014-06-18 VITALS — BP 164/84 | HR 82 | Temp 98.9°F | Resp 16 | Ht 61.0 in | Wt 113.4 lb

## 2014-06-18 DIAGNOSIS — R29898 Other symptoms and signs involving the musculoskeletal system: Secondary | ICD-10-CM

## 2014-06-18 DIAGNOSIS — I10 Essential (primary) hypertension: Secondary | ICD-10-CM

## 2014-06-18 MED ORDER — CARISOPRODOL 250 MG PO TABS: 250.0000 mg | ORAL_TABLET | Freq: Every day | ORAL | Status: DC

## 2014-06-18 MED ORDER — LISINOPRIL-HYDROCHLOROTHIAZIDE 10-12.5 MG PO TABS: 1.0000 | ORAL_TABLET | Freq: Every day | ORAL | Status: DC

## 2014-06-18 NOTE — Assessment & Plan Note (Signed)
Still with significant spasm. Will add nightly 250 mg Soma tablet 2 current regimen. Patient is to follow-up with hand specialist as scheduled.

## 2014-06-18 NOTE — Progress Notes (Signed)
Pre visit review using our clinic review tool, if applicable. No additional management support is needed unless otherwise documented below in the visit note/SLS  

## 2014-06-18 NOTE — Progress Notes (Signed)
   Patient presents to clinic today for 2 month follow-up of hypertension. Patient previously on 25 mg hydrochlorothiazide daily. Endorses taking medications daily as directed. Patient denies chest pain, palpitations, lightheadedness, dizziness, vision changes or frequent headaches.  Patient still dealing with pain, spasm and weakness of her hand. Has upcoming appointment with new hand specialist. Is taking baclofen 20 mg 4 times daily as prescribed by her neurologist with breakthrough symptoms.  Past Medical History  Diagnosis Date  . Seizure   . HA (headache)   . Viral encephalitis   . Viral meningitis   . Hypertension     Current Outpatient Prescriptions on File Prior to Visit  Medication Sig Dispense Refill  . baclofen (LIORESAL) 20 MG tablet Take 1 tablet (20 mg total) by mouth 4 (four) times daily. Take 1 tab every 8 hours as needed. 120 tablet 2  . pantoprazole (PROTONIX) 40 MG tablet TAKE 1 TABLET BY MOUTH DAILY AT 6 AM 30 tablet 5   No current facility-administered medications on file prior to visit.    No Known Allergies  Family History  Problem Relation Age of Onset  . Hypertension Mother   . Heart disease Mother   . Diabetes Mother   . Hypertension Father   . Alzheimer's disease Father   . Diabetes Maternal Grandmother   . Hypertension Maternal Aunt   . Diabetes Maternal Aunt   . Hypertension Sister   . Healthy Brother     x3  . Healthy Daughter     x4  . Seizures Daughter   . Healthy Son     x1    History   Social History  . Marital Status: Single    Spouse Name: N/A  . Number of Children: N/A  . Years of Education: N/A   Social History Main Topics  . Smoking status: Never Smoker   . Smokeless tobacco: Never Used  . Alcohol Use: No  . Drug Use: No  . Sexual Activity: No   Other Topics Concern  . None   Social History Narrative   Review of Systems - See HPI.  All other ROS are negative.  BP 164/84 mmHg  Pulse 82  Temp(Src) 98.9 F (37.2  C) (Oral)  Resp 16  Ht 5\' 1"  (1.549 m)  Wt 113 lb 6 oz (51.427 kg)  BMI 21.43 kg/m2  SpO2 100%  Physical Exam  Constitutional: She is oriented to person, place, and time and well-developed, well-nourished, and in no distress.  HENT:  Head: Normocephalic and atraumatic.  Cardiovascular: Normal rate, regular rhythm, normal heart sounds and intact distal pulses.   Pulmonary/Chest: Effort normal and breath sounds normal. No respiratory distress. She has no wheezes. She has no rales. She exhibits no tenderness.  Neurological: She is alert and oriented to person, place, and time.  Skin: Skin is warm and dry. No rash noted.  Psychiatric: Affect normal.  Vitals reviewed.  Assessment/Plan: Essential hypertension, benign We'll begin combination pill of lisinopril-hydrochlorothiazide 10-12.5 mg daily. Continue DASH diet. Stay active. Check blood pressure twice weekly at home. Follow-up in 2 months.   Right arm weakness Still with significant spasm. Will add nightly 250 mg Soma tablet 2 current regimen. Patient is to follow-up with hand specialist as scheduled.

## 2014-06-18 NOTE — Assessment & Plan Note (Signed)
We'll begin combination pill of lisinopril-hydrochlorothiazide 10-12.5 mg daily. Continue DASH diet. Stay active. Check blood pressure twice weekly at home. Follow-up in 2 months.

## 2014-06-18 NOTE — Patient Instructions (Signed)
Please continue the Baclofen as directed by Dr. Everlena Cooper.  Start the [employer] at bedtime.  Be sure to follow-up with the specialist at [facility] as scheduled.  For the blood pressure.  Start taking the new medication daily as directed.  Try to limit salt intake and stay active.  Follow-up in 2 months for a BP reassessment.

## 2014-08-18 ENCOUNTER — Ambulatory Visit (INDEPENDENT_AMBULATORY_CARE_PROVIDER_SITE_OTHER): Payer: Medicaid Other | Admitting: Physician Assistant

## 2014-08-18 ENCOUNTER — Encounter: Payer: Self-pay | Admitting: Physician Assistant

## 2014-08-18 VITALS — BP 145/68 | HR 95 | Temp 98.4°F | Resp 14 | Ht 61.0 in | Wt 113.4 lb

## 2014-08-18 DIAGNOSIS — I10 Essential (primary) hypertension: Secondary | ICD-10-CM

## 2014-08-18 MED ORDER — LISINOPRIL-HYDROCHLOROTHIAZIDE 10-12.5 MG PO TABS: 1.0000 | ORAL_TABLET | Freq: Every day | ORAL | Status: DC

## 2014-08-18 NOTE — Patient Instructions (Signed)
Please continue medications as directed. Watch your salt intake. Stay active.  Follow-up in 6 months.  Hypertension Hypertension, commonly called high blood pressure, is when the force of blood pumping through your arteries is too strong. Your arteries are the blood vessels that carry blood from your heart throughout your body. A blood pressure reading consists of a higher number over a lower number, such as 110/72. The higher number (systolic) is the pressure inside your arteries when your heart pumps. The lower number (diastolic) is the pressure inside your arteries when your heart relaxes. Ideally you want your blood pressure below 120/80. Hypertension forces your heart to work harder to pump blood. Your arteries may become narrow or stiff. Having hypertension puts you at risk for heart disease, stroke, and other problems.  RISK FACTORS Some risk factors for high blood pressure are controllable. Others are not.  Risk factors you cannot control include:   Race. You may be at higher risk if you are African American.  Age. Risk increases with age.  Gender. Men are at higher risk than women before age 30 years. After age 40, women are at higher risk than men. Risk factors you can control include:  Not getting enough exercise or physical activity.  Being overweight.  Getting too much fat, sugar, calories, or salt in your diet.  Drinking too much alcohol. SIGNS AND SYMPTOMS Hypertension does not usually cause signs or symptoms. Extremely high blood pressure (hypertensive crisis) may cause headache, anxiety, shortness of breath, and nosebleed. DIAGNOSIS  To check if you have hypertension, your health care provider will measure your blood pressure while you are seated, with your arm held at the level of your heart. It should be measured at least twice using the same arm. Certain conditions can cause a difference in blood pressure between your right and left arms. A blood pressure reading  that is higher than normal on one occasion does not mean that you need treatment. If one blood pressure reading is high, ask your health care provider about having it checked again. TREATMENT  Treating high blood pressure includes making lifestyle changes and possibly taking medicine. Living a healthy lifestyle can help lower high blood pressure. You may need to change some of your habits. Lifestyle changes may include:  Following the DASH diet. This diet is high in fruits, vegetables, and whole grains. It is low in salt, red meat, and added sugars.  Getting at least 2 hours of brisk physical activity every week.  Losing weight if necessary.  Not smoking.  Limiting alcoholic beverages.  Learning ways to reduce stress. If lifestyle changes are not enough to get your blood pressure under control, your health care provider may prescribe medicine. You may need to take more than one. Work closely with your health care provider to understand the risks and benefits. HOME CARE INSTRUCTIONS  Have your blood pressure rechecked as directed by your health care provider.   Take medicines only as directed by your health care provider. Follow the directions carefully. Blood pressure medicines must be taken as prescribed. The medicine does not work as well when you skip doses. Skipping doses also puts you at risk for problems.   Do not smoke.   Monitor your blood pressure at home as directed by your health care provider. SEEK MEDICAL CARE IF:   You think you are having a reaction to medicines taken.  You have recurrent headaches or feel dizzy.  You have swelling in your ankles.  You have  trouble with your vision. SEEK IMMEDIATE MEDICAL CARE IF:  You develop a severe headache or confusion.  You have unusual weakness, numbness, or feel faint.  You have severe chest or abdominal pain.  You vomit repeatedly.  You have trouble breathing. MAKE SURE YOU:   Understand these  instructions.  Will watch your condition.  Will get help right away if you are not doing well or get worse. Document Released: 04/25/2005 Document Revised: 09/09/2013 Document Reviewed: 02/15/2013 [facility] Patient Information 2015 [place], [place]. This information is not intended to replace advice given to you by your health care provider. Make sure you discuss any questions you have with your health care provider.

## 2014-08-18 NOTE — Progress Notes (Signed)
Pre visit review using our clinic review tool, if applicable. No additional management support is needed unless otherwise documented below in the visit note/SLS  

## 2014-08-18 NOTE — Progress Notes (Signed)
   Patient presents to clinic today for follw-up of hypertension.  Is taking Lisinopril-HCTZ daily. Patient denies chest pain, palpitations, lightheadedness, dizziness, vision changes or frequent headaches.  Past Medical History  Diagnosis Date  . Seizure   . HA (headache)   . Viral encephalitis   . Viral meningitis   . Hypertension     Current Outpatient Prescriptions on File Prior to Visit  Medication Sig Dispense Refill  . baclofen (LIORESAL) 20 MG tablet Take 1 tablet (20 mg total) by mouth 4 (four) times daily. Take 1 tab every 8 hours as needed. 120 tablet 2  . pantoprazole (PROTONIX) 40 MG tablet TAKE 1 TABLET BY MOUTH DAILY AT 6 AM 30 tablet 5  . carisoprodol (SOMA) 250 MG tablet Take 1 tablet (250 mg total) by mouth at bedtime. (Patient not taking: Reported on 08/18/2014) 30 tablet 0   No current facility-administered medications on file prior to visit.    No Known Allergies  Family History  Problem Relation Age of Onset  . Hypertension Mother   . Heart disease Mother   . Diabetes Mother   . Hypertension Father   . Alzheimer's disease Father   . Diabetes Maternal Grandmother   . Hypertension Maternal Aunt   . Diabetes Maternal Aunt   . Hypertension Sister   . Healthy Brother     x3  . Healthy Daughter     x4  . Seizures Daughter   . Healthy Son     x1    History   Social History  . Marital Status: Single    Spouse Name: N/A  . Number of Children: N/A  . Years of Education: N/A   Social History Main Topics  . Smoking status: Never Smoker   . Smokeless tobacco: Never Used  . Alcohol Use: No  . Drug Use: No  . Sexual Activity: No   Other Topics Concern  . None   Social History Narrative   Review of Systems - See HPI.  All other ROS are negative.  BP 145/68 mmHg  Pulse 95  Temp(Src) 98.4 F (36.9 C) (Oral)  Resp 14  Ht  (1.549 m)  Wt 113 lb 6 oz (51.427 kg)  BMI 21.43 kg/m2  SpO2 100%  Physical Exam  Constitutional: She is oriented  to person, place, and time and well-developed, well-nourished, and in no distress.  HENT:  Head: Normocephalic and atraumatic.  Cardiovascular: Normal rate, regular rhythm, normal heart sounds and intact distal pulses.   Pulmonary/Chest: Effort normal.  Neurological: She is alert and oriented to person, place, and time.  Skin: Skin is warm and dry. No rash noted.  Psychiatric: Affect normal.  Vitals reviewed.  Assessment/Plan: Essential hypertension, benign Stable.  Continue current regimen.  Medications refilled.  Follow-up in 6 months.

## 2014-08-18 NOTE — Assessment & Plan Note (Signed)
Stable.  Continue current regimen.  Medications refilled.  Follow-up in 6 months.

## 2014-10-23 ENCOUNTER — Other Ambulatory Visit: Payer: Self-pay | Admitting: Physician Assistant

## 2014-10-23 NOTE — Telephone Encounter (Signed)
Rx sent to the pharmacy by e-script.//AB/CMA 

## 2015-02-17 ENCOUNTER — Encounter (INDEPENDENT_AMBULATORY_CARE_PROVIDER_SITE_OTHER): Payer: Self-pay

## 2015-02-17 ENCOUNTER — Ambulatory Visit (INDEPENDENT_AMBULATORY_CARE_PROVIDER_SITE_OTHER): Payer: Medicaid Other | Admitting: Physician Assistant

## 2015-02-17 ENCOUNTER — Encounter: Payer: Self-pay | Admitting: Physician Assistant

## 2015-02-17 VITALS — BP 134/72 | HR 87 | Temp 98.3°F | Resp 16 | Ht 61.0 in | Wt 135.5 lb

## 2015-02-17 DIAGNOSIS — I10 Essential (primary) hypertension: Secondary | ICD-10-CM | POA: Diagnosis not present

## 2015-02-17 DIAGNOSIS — Z1239 Encounter for other screening for malignant neoplasm of breast: Secondary | ICD-10-CM | POA: Diagnosis not present

## 2015-02-17 LAB — BASIC METABOLIC PANEL
BUN: 13 mg/dL (ref 6–23)
CALCIUM: 9.8 mg/dL (ref 8.4–10.5)
CO2: 25 meq/L (ref 19–32)
Chloride: 98 mEq/L (ref 96–112)
Creatinine, Ser: 0.55 mg/dL (ref 0.40–1.20)
GFR: 144.03 mL/min (ref >60.00)
Glucose, Bld: 145 mg/dL — ABNORMAL HIGH (ref 70–99)
Potassium: 3.8 mEq/L (ref 3.5–5.1)
SODIUM: 137 meq/L (ref 135–145)

## 2015-02-17 MED ORDER — LISINOPRIL-HYDROCHLOROTHIAZIDE 10-12.5 MG PO TABS: 1.0000 | ORAL_TABLET | Freq: Every day | ORAL | Status: DC

## 2015-02-17 NOTE — Assessment & Plan Note (Signed)
Well controlled. Continue current regimen. Will obtain BMP today.

## 2015-02-17 NOTE — Progress Notes (Signed)
    Patient presents to clinic today for follow-up of Hypertension. Patient denies chest pain, palpitations, lightheadedness, dizziness, vision changes or frequent headaches. Is taking BP medications as directed.   BP Readings from Last 3 Encounters:  02/17/15 134/72  08/18/14 145/68  06/18/14 164/84   HM topics discussed: Mammogram -- average risk and overdue. Is agreeing to screening mammogram. Colonoscopy -- average risk. Overdue. Patient refuses and refuses IFOB or Cologuard.  Past Medical History  Diagnosis Date  . Seizure (HCC)   . HA (headache)   . Viral encephalitis   . Viral meningitis   . Hypertension     Current Outpatient Prescriptions on File Prior to Visit  Medication Sig Dispense Refill  . pantoprazole (PROTONIX) 40 MG tablet TAKE 1 TABLET BY MOUTH DAILY AT 6:00 AM 90 tablet 1  . predniSONE (DELTASONE) 20 MG tablet Take 50 mg by mouth every other day.    . Multiple Minerals-Vitamins (CALCIUM & VIT D3 BONE HEALTH PO) Take by mouth daily.     No current facility-administered medications on file prior to visit.    No Known Allergies  Family History  Problem Relation Age of Onset  . Hypertension Mother   . Heart disease Mother   . Diabetes Mother   . Hypertension Father   . Alzheimer's disease Father   . Diabetes Maternal Grandmother   . Hypertension Maternal Aunt   . Diabetes Maternal Aunt   . Hypertension Sister   . Healthy Brother     x3  . Healthy Daughter     x4  . Seizures Daughter   . Healthy Son     x1    Social History   Social History  . Marital Status: Single    Spouse Name: N/A  . Number of Children: N/A  . Years of Education: N/A   Social History Main Topics  . Smoking status: Never Smoker   . Smokeless tobacco: Never Used  . Alcohol Use: No  . Drug Use: No  . Sexual Activity: No   Other Topics Concern  . None   Social History Narrative   Review of Systems - See HPI.  All other ROS are negative.  BP 134/72 mmHg  Pulse  87  Temp(Src) 98.3 F (36.8 C) (Oral)  Resp 16  Ht  (1.549 m)  Wt 135 lb 8 oz (61.462 kg)  BMI 25.62 kg/m2  SpO2 99%  Physical Exam  Constitutional: She is oriented to person, place, and time and well-developed, well-nourished, and in no distress.  HENT:  Head: Normocephalic and atraumatic.  Eyes: Conjunctivae are normal.  Neck: Neck supple.  Cardiovascular: Normal rate, regular rhythm, normal heart sounds and intact distal pulses.   Pulmonary/Chest: Effort normal and breath sounds normal. No respiratory distress. She has no wheezes. She has no rales. She exhibits no tenderness.  Neurological: She is alert and oriented to person, place, and time.  Skin: Skin is warm and dry. No rash noted.  Psychiatric: Affect normal.  Vitals reviewed.   No results found for this or any previous visit (from the past 2160 hour(s)).  Assessment/Plan: Essential hypertension, benign Well controlled. Continue current regimen. Will obtain BMP today.  Breast cancer screening Order placed for screening mammogram

## 2015-02-17 NOTE — Assessment & Plan Note (Signed)
Order placed for screening mammogram

## 2015-02-17 NOTE — Progress Notes (Signed)
Pre visit review using our clinic review tool, if applicable. No additional management support is needed unless otherwise documented below in the visit note/SLS  

## 2015-02-17 NOTE — Patient Instructions (Signed)
Please continue medications as directed. Stay active to help keep a healthy weight. Follow-up in 6 months for a physical.

## 2015-05-25 IMAGING — CT CT ABD-PELV W/ CM
2 of 5 series · 14 of 36 positions shown, 17 images · IV contrast (APPLIED)
Comparison: None.

CLINICAL DATA: Fever. Viral encephalitis.

EXAM:
CT CHEST, ABDOMEN, AND PELVIS WITH CONTRAST
TECHNIQUE: Multidetector CT imaging of the chest, abdomen and pelvis was
performed following the standard protocol during bolus
administration of intravenous contrast.
CONTRAST:  80mL OMNIPAQUE IOHEXOL 300 MG/ML  SOLN

[Series 2: cap 5.0 i31f 1 · axial · 0.63mm/px · z∈[-749,-249]mm · 11 of 116 slices shown, 14 images]
[im 8/116  mediastinal]
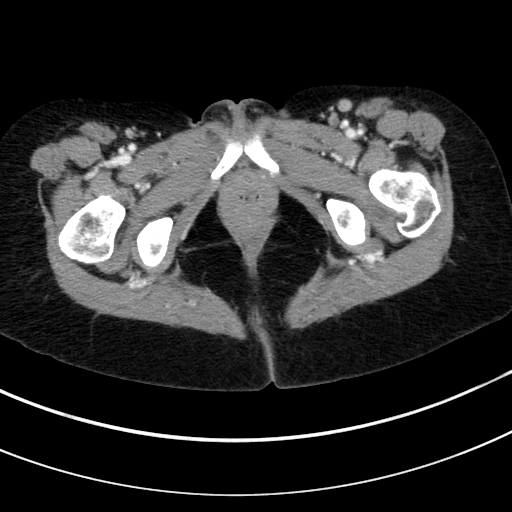
[im 8/116  lung]
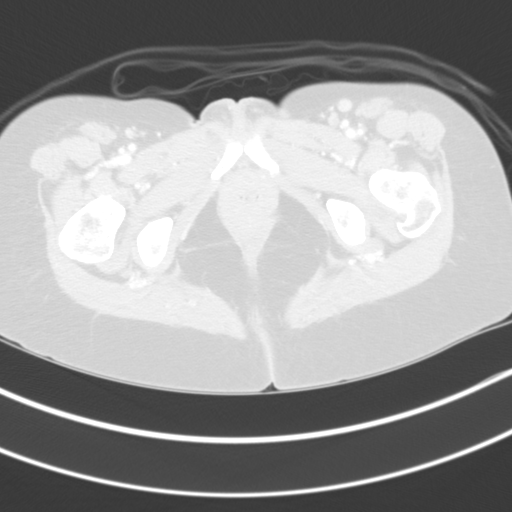
[im 16/116  lung]
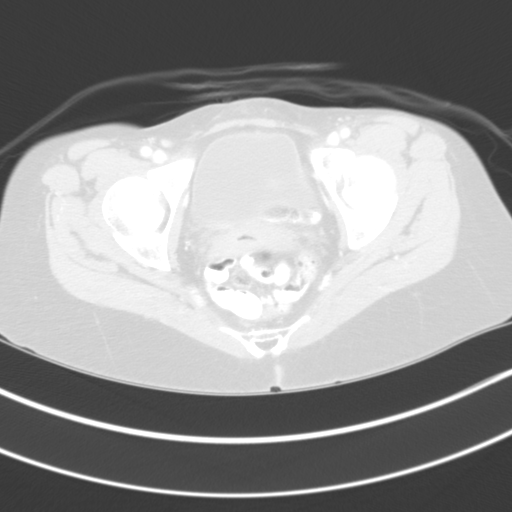
[im 31/116  lung]
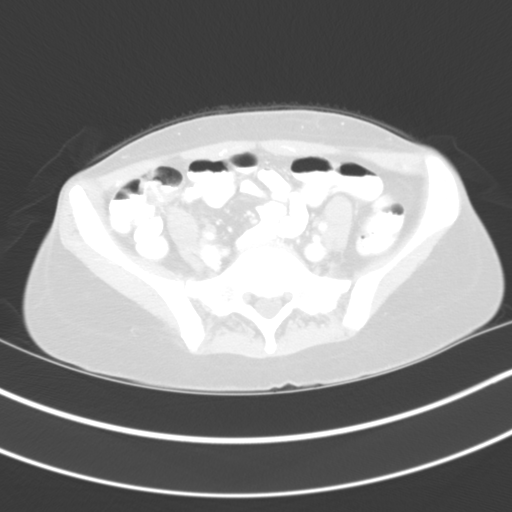
[im 39/116  lung]
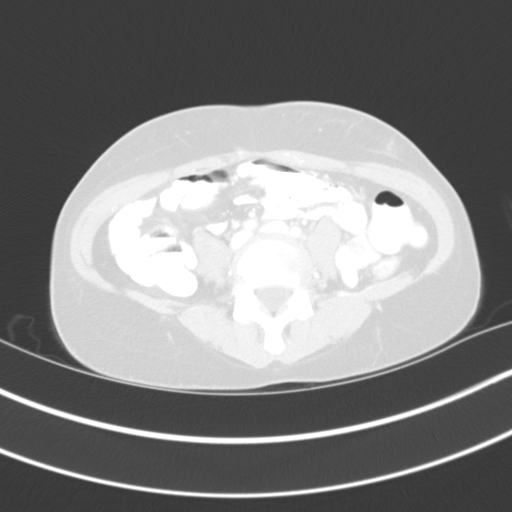
[im 47/116  mediastinal]
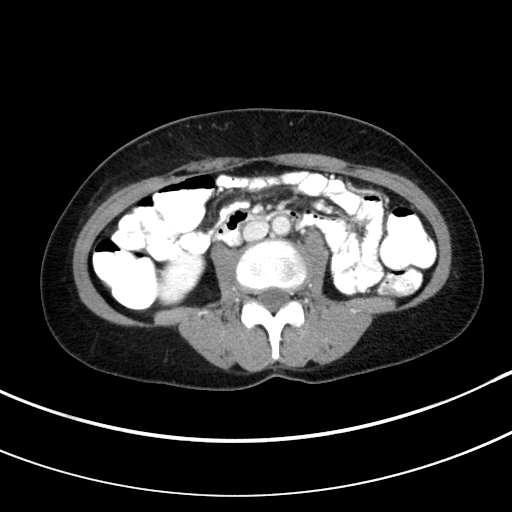
[im 47/116  lung]
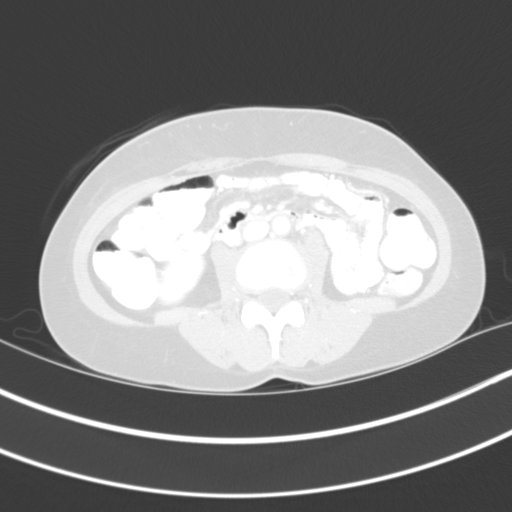
[im 62/116  lung]
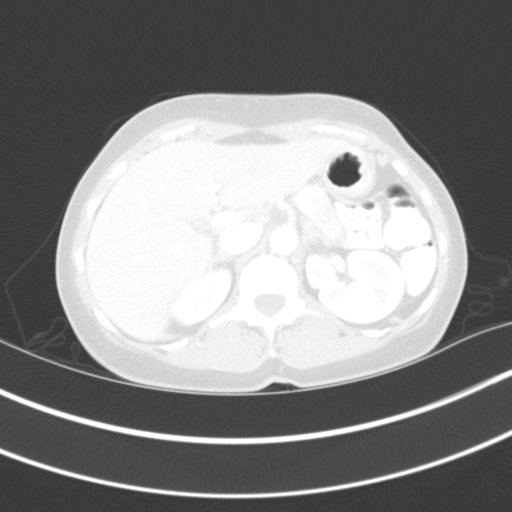
[im 70/116  lung]
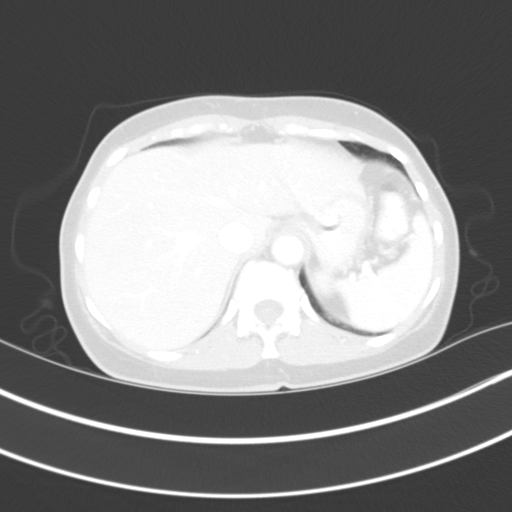
[im 77/116  lung]
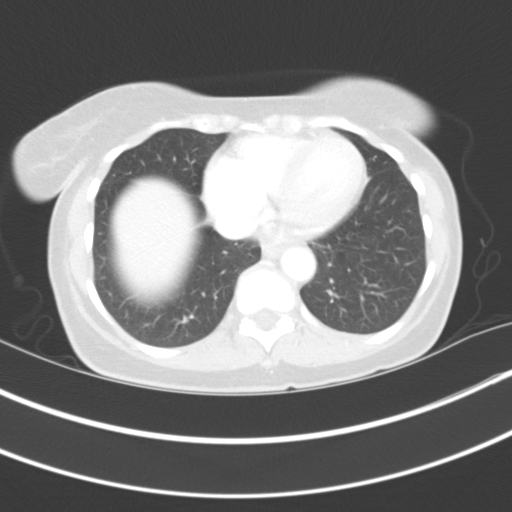
[im 85/116  mediastinal]
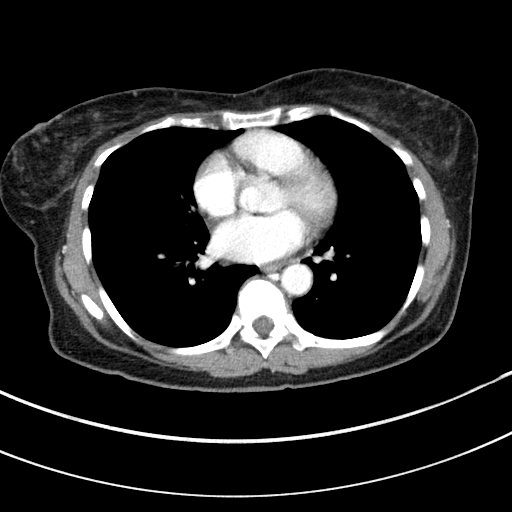
[im 85/116  lung]
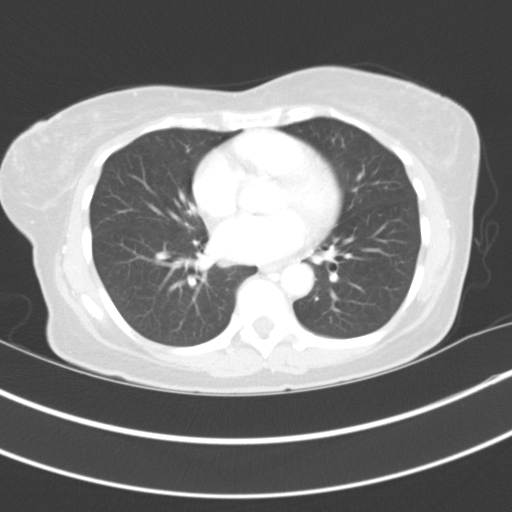
[im 100/116  lung]
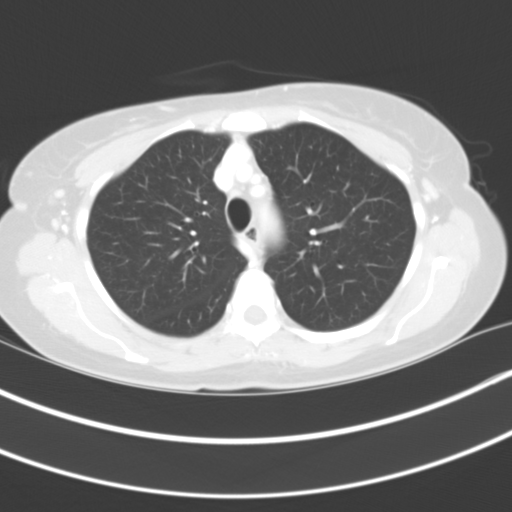
[im 108/116  lung]
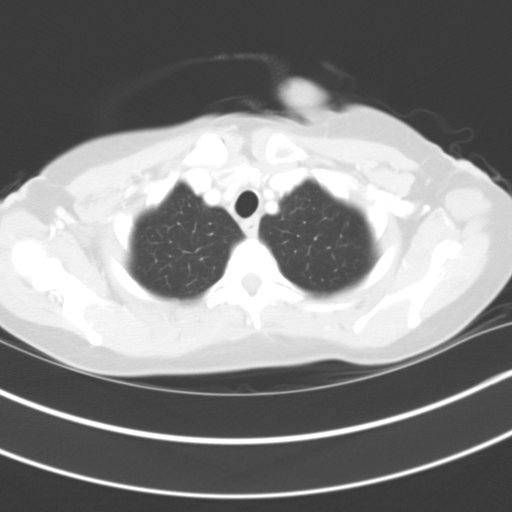

[Series 5: coronal · coronal · 0.66mm/px · 3 of 65 slices shown]
[im 13/65  lung]
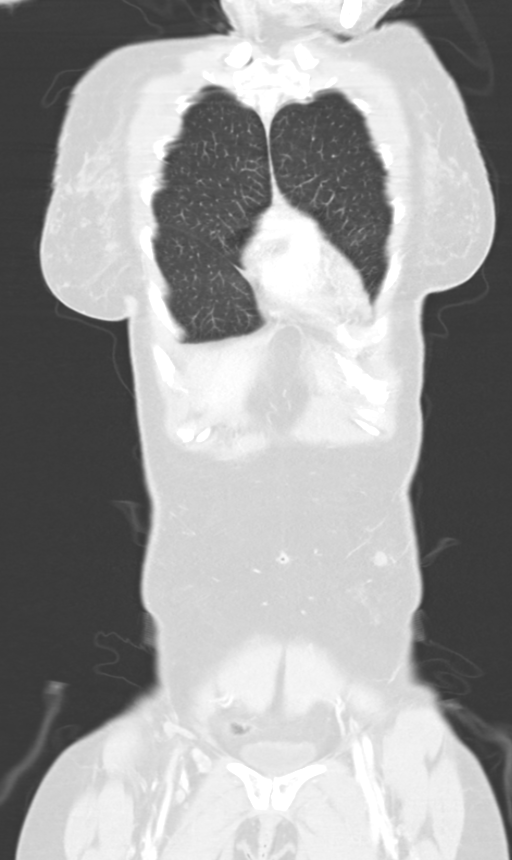
[im 26/65  lung]
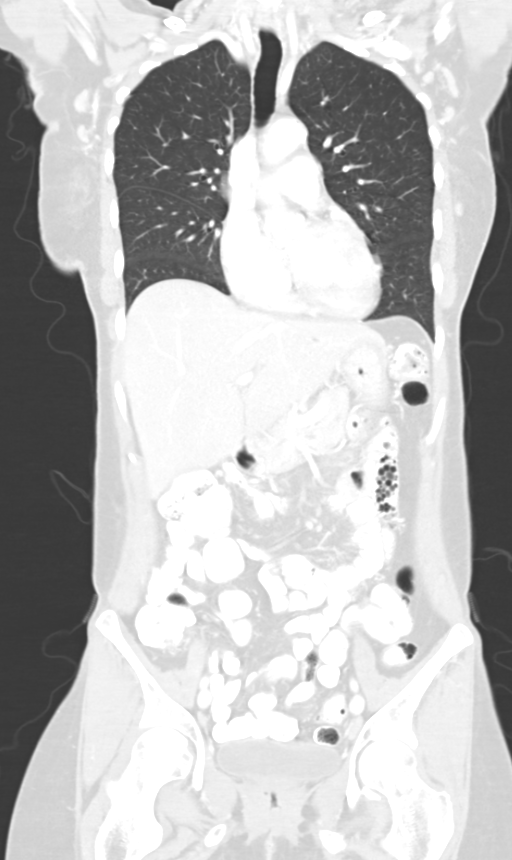
[im 39/65  lung]
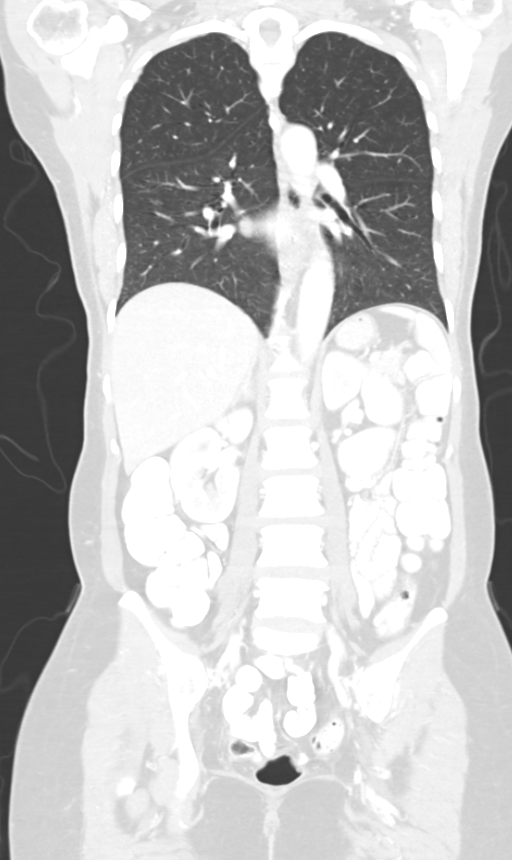

[14 of 36 positions shown; findings below may reference images not displayed]

FINDINGS: CT CHEST FINDINGS

There is no axillary, hilar or mediastinal lymphadenopathy. Heart
size is normal. No pleural or pericardial effusion. The lungs are
clear. No bony abnormality is identified.

CT ABDOMEN AND PELVIS FINDINGS

The liver, gallbladder, adrenal glands, spleen, pancreas and kidneys
all appear normal. The patient is status post hysterectomy. The
stomach, small and large bowel and appendix appear normal. Small
left pelvic sidewall node measures 0.8 cm short axis dimension. No
pathologic lymphadenopathy by CT size criteria is identified. No
focal bony abnormality is seen.
IMPRESSION: CT CHEST IMPRESSION

Negative chest CT.

CT ABDOMEN AND PELVIS IMPRESSION

No acute finding in the abdomen or pelvis. No source for fever is
identified.

Status post hysterectomy.

## 2015-06-03 ENCOUNTER — Other Ambulatory Visit: Payer: Self-pay | Admitting: Physician Assistant

## 2015-07-13 ENCOUNTER — Encounter (HOSPITAL_BASED_OUTPATIENT_CLINIC_OR_DEPARTMENT_OTHER): Payer: Self-pay | Admitting: Emergency Medicine

## 2015-07-13 ENCOUNTER — Emergency Department (HOSPITAL_BASED_OUTPATIENT_CLINIC_OR_DEPARTMENT_OTHER): Payer: Medicaid Other

## 2015-07-13 DIAGNOSIS — M25511 Pain in right shoulder: Secondary | ICD-10-CM | POA: Insufficient documentation

## 2015-07-13 DIAGNOSIS — I10 Essential (primary) hypertension: Secondary | ICD-10-CM | POA: Insufficient documentation

## 2015-07-13 DIAGNOSIS — Z8619 Personal history of other infectious and parasitic diseases: Secondary | ICD-10-CM | POA: Insufficient documentation

## 2015-07-13 DIAGNOSIS — Z79899 Other long term (current) drug therapy: Secondary | ICD-10-CM | POA: Insufficient documentation

## 2015-07-13 NOTE — ED Notes (Signed)
Patient states that she has had pain to her right shoulder since Friday. Patient denies any injury or known cause for the pain.

## 2015-07-14 ENCOUNTER — Emergency Department (HOSPITAL_BASED_OUTPATIENT_CLINIC_OR_DEPARTMENT_OTHER)
Admission: EM | Admit: 2015-07-14 | Discharge: 2015-07-14 | Disposition: A | Discharge status: Home / Self Care | Payer: Medicaid Other | Attending: Emergency Medicine | Admitting: Emergency Medicine

## 2015-07-14 ENCOUNTER — Encounter (HOSPITAL_BASED_OUTPATIENT_CLINIC_OR_DEPARTMENT_OTHER): Payer: Self-pay | Admitting: Emergency Medicine

## 2015-07-14 DIAGNOSIS — M25511 Pain in right shoulder: Secondary | ICD-10-CM

## 2015-07-14 MED ORDER — NAPROXEN 250 MG PO TABS: 500.0000 mg | ORAL_TABLET | Freq: Once | ORAL | Status: DC

## 2015-07-14 MED ORDER — NAPROXEN 500 MG PO TABS: 500.0000 mg | ORAL_TABLET | Freq: Two times a day (BID) | ORAL | Status: DC | PRN

## 2015-07-14 NOTE — ED Provider Notes (Signed)
CSN: [id number]     Arrival date & time 07/13/15  2221 History   First MD Initiated Contact with Patient 07/14/15 [phone]     Chief Complaint  Patient presents with  . Shoulder Pain     (Consider location/radiation/quality/duration/timing/severity/associated sxs/prior Treatment) HPI  This is a 65 year old female with right shoulder pain for the past 4 days. The pain is located in her right axillary and scapular areas. When asked what the pain feels like she responds "I don't know". When asked how severe the pain was she states a 3 out of 10 but milder than that now. Pain is worse with movement. She denies any injury.  Past Medical History  Diagnosis Date  . Seizure (HCC)   . HA (headache)   . Viral encephalitis   . Viral meningitis   . Hypertension    History reviewed. No pertinent past surgical history. Family History  Problem Relation Age of Onset  . Hypertension Mother   . Heart disease Mother   . Diabetes Mother   . Hypertension Father   . Alzheimer's disease Father   . Diabetes Maternal Grandmother   . Hypertension Maternal Aunt   . Diabetes Maternal Aunt   . Hypertension Sister   . Healthy Brother     x3  . Healthy Daughter     x4  . Seizures Daughter   . Healthy Son     x1   Social History  Substance Use Topics  . Smoking status: Never Smoker   . Smokeless tobacco: Never Used  . Alcohol Use: No   OB History    No data available     Review of Systems  All other systems reviewed and are negative.   Allergies  Review of patient's allergies indicates no known allergies.  Home Medications   Prior to Admission medications   Medication Sig Start Date End Date Taking? Authorizing Provider  baclofen (LIORESAL) 10 MG tablet Take 10 mg by mouth 3 (three) times daily.   Yes Historical Provider, MD  carbamazepine (TEGRETOL) 100 MG chewable tablet Chew 300 mg by mouth 2 (two) times daily.    Historical Provider, MD  Cholecalciferol (VITAMIN D-3) 1000 UNITS CAPS  Take by mouth daily.    Historical Provider, MD  lisinopril-hydrochlorothiazide (PRINZIDE,ZESTORETIC) 10-12.5 MG tablet Take 1 tablet by mouth daily. 02/17/15   Waldon Merl, PA-C  Multiple Minerals-Vitamins (CALCIUM & VIT D3 BONE HEALTH PO) Take by mouth daily.    Historical Provider, MD  pantoprazole (PROTONIX) 40 MG tablet TAKE 1 TABLET BY MOUTH EVERY DAY AT 6:00AM 06/03/15   Waldon Merl, PA-C  predniSONE (DELTASONE) 20 MG tablet Take 50 mg by mouth every other day.    Historical Provider, MD   BP 132/69 mmHg  Pulse 94  Temp(Src) 99 F (37.2 C) (Oral)  Resp 18  Ht 5\' 1"  (1.549 m)  Wt 135 lb (61.236 kg)  BMI 25.52 kg/m2  SpO2 96%   Physical Exam  General: Well-developed, well-nourished female in no acute distress; appearance consistent with age of record HENT: normocephalic; atraumatic Eyes: pupils equal, round and reactive to light; extraocular muscles intact Neck: supple Heart: regular rate and rhythm Lungs: clear to auscultation bilaterally Abdomen: soft; nondistended Extremities: No deformity; full range of motion; mild right scapular tenderness, mild pain on range of motion of right shoulder Neurologic: Awake, alert and oriented; motor function intact in all extremities and symmetric; no facial droop Skin: Warm and dry Psychiatric: Normal mood and affect  ED Course  Procedures (including critical care time)   MDM  Nursing notes and vitals signs, including pulse oximetry, reviewed.  Summary of this visit's results, reviewed by myself:  Imaging Studies: Dg Shoulder Right  07/13/2015  CLINICAL DATA:  Pain on the top of the right shoulder since Friday. Limited range of motion. EXAM: RIGHT SHOULDER - 2+ VIEW COMPARISON:  01/25/2014 FINDINGS: There is no evidence of fracture or dislocation. There is no evidence of arthropathy or other focal bone abnormality. Soft tissues are unremarkable. IMPRESSION: Negative. Electronically Signed   By: Burman Nieves M.D.   On:  07/13/2015 23:09      Paula Libra, MD 07/14/15 [phone]

## 2015-07-31 ENCOUNTER — Ambulatory Visit: Payer: Medicaid Other | Admitting: Physician Assistant

## 2015-08-19 ENCOUNTER — Telehealth: Payer: Self-pay | Admitting: Physician Assistant

## 2015-08-19 ENCOUNTER — Encounter: Payer: Medicaid Other | Admitting: Physician Assistant

## 2015-08-20 ENCOUNTER — Encounter: Payer: Self-pay | Admitting: Physician Assistant

## 2015-08-20 NOTE — Telephone Encounter (Signed)
Waiving fee, mailing reminder letter °

## 2015-08-20 NOTE — Telephone Encounter (Signed)
No charge. 

## 2015-08-20 NOTE — Telephone Encounter (Signed)
Pt was no show 08/19/15 10:30am for cpe appt, 1st no show w/in 12 months, 1 cancellation, charge or no charge?

## 2015-08-24 ENCOUNTER — Telehealth: Payer: Self-pay | Admitting: Behavioral Health

## 2015-08-24 NOTE — Telephone Encounter (Signed)
Unable to reach patient at time of Pre-Visit Call.  Left message for patient to return call when available.    

## 2015-08-25 ENCOUNTER — Ambulatory Visit (INDEPENDENT_AMBULATORY_CARE_PROVIDER_SITE_OTHER): Payer: Medicaid Other | Admitting: Physician Assistant

## 2015-08-25 ENCOUNTER — Encounter: Payer: Self-pay | Admitting: Physician Assistant

## 2015-08-25 VITALS — BP 130/76 | HR 95 | Temp 99.1°F | Wt 142.0 lb

## 2015-08-25 DIAGNOSIS — Z1239 Encounter for other screening for malignant neoplasm of breast: Secondary | ICD-10-CM | POA: Diagnosis not present

## 2015-08-25 DIAGNOSIS — Z0000 Encounter for general adult medical examination without abnormal findings: Secondary | ICD-10-CM | POA: Diagnosis not present

## 2015-08-25 DIAGNOSIS — I10 Essential (primary) hypertension: Secondary | ICD-10-CM

## 2015-08-25 LAB — CBC
HEMATOCRIT: 32.4 % — AB (ref 36.0–46.0)
HEMOGLOBIN: 10.9 g/dL — AB (ref 12.0–15.0)
MCHC: 33.7 g/dL (ref 30.0–36.0)
MCV: 91.4 fl (ref 78.0–100.0)
Platelets: 310 10*3/uL (ref 150.0–400.0)
RBC: 3.55 Mil/uL — ABNORMAL LOW (ref 3.87–5.11)
RDW: 14.2 % (ref 11.5–15.5)
WBC: 6.9 10*3/uL (ref 4.0–10.5)

## 2015-08-25 LAB — LIPID PANEL
CHOLESTEROL: 236 mg/dL — AB (ref 0–200)
HDL: 55.9 mg/dL (ref >39.00)
LDL Cholesterol: 157 mg/dL — ABNORMAL HIGH (ref 0–99)
NONHDL: 179.65
Total CHOL/HDL Ratio: 4
Triglycerides: 112 mg/dL (ref 0.0–149.0)
VLDL: 22.4 mg/dL (ref 0.0–40.0)

## 2015-08-25 LAB — COMPREHENSIVE METABOLIC PANEL
ALBUMIN: 3.9 g/dL (ref 3.5–5.2)
ALK PHOS: 113 U/L (ref 39–117)
ALT: 23 U/L (ref 0–35)
AST: 24 U/L (ref 0–37)
BILIRUBIN TOTAL: 0.3 mg/dL (ref 0.2–1.2)
BUN: 11 mg/dL (ref 6–23)
CALCIUM: 9.4 mg/dL (ref 8.4–10.5)
CO2: 29 meq/L (ref 19–32)
CREATININE: 0.48 mg/dL (ref 0.40–1.20)
Chloride: 97 mEq/L (ref 96–112)
GFR: 168.25 mL/min (ref >60.00)
Glucose, Bld: 139 mg/dL — ABNORMAL HIGH (ref 70–99)
Potassium: 3 mEq/L — ABNORMAL LOW (ref 3.5–5.1)
Sodium: 138 mEq/L (ref 135–145)
TOTAL PROTEIN: 7.3 g/dL (ref 6.0–8.3)

## 2015-08-25 LAB — HEMOGLOBIN A1C: HEMOGLOBIN A1C: 6.8 % — AB (ref 4.6–6.5)

## 2015-08-25 LAB — TSH: TSH: 0.32 u[IU]/mL — ABNORMAL LOW (ref 0.35–4.50)

## 2015-08-25 NOTE — Progress Notes (Signed)
Pre visit review using our clinic tool,if applicable. No additional management support is needed unless otherwise documented below in the visit note.  

## 2015-08-25 NOTE — Assessment & Plan Note (Signed)
Depression screen negative. Health Maintenance reviewed -- Declines tetanus. Agrees to mammogram (repeat order placed). Declines colonoscopy, Cologuard, IFOB tests AMA. Preventive schedule discussed and handout given in AVS. Will obtain fasting labs today.

## 2015-08-25 NOTE — Patient Instructions (Signed)
Please go to the lab for blood work.   Our office will call you with your results unless you have chosen to receive results via MyChart.  If your blood work is normal we will follow-up each year for physicals and as scheduled for chronic medical problems.  If anything is abnormal we will treat accordingly and get you in for a follow-up.  Please continue chronic medication regimen. Follow-up with your specialists as scheduled.  You will be contacted for a mammogram. Please get this scheduled. Please reconsider letting us screen you for colon cancer.   Preventive Care for Adults, Female A healthy lifestyle and preventive care can promote health and wellness. Preventive health guidelines for women include the following key practices.  A routine yearly physical is a good way to check with your health care provider about your health and preventive screening. It is a chance to share any concerns and updates on your health and to receive a thorough exam.  Visit your dentist for a routine exam and preventive care every 6 months. Brush your teeth twice a day and floss once a day. Good oral hygiene prevents tooth decay and gum disease.  The frequency of eye exams is based on your age, health, family medical history, use of contact lenses, and other factors. Follow your health care provider's recommendations for frequency of eye exams.  Eat a healthy diet. Foods like vegetables, fruits, whole grains, low-fat dairy products, and lean protein foods contain the nutrients you need without too many calories. Decrease your intake of foods high in solid fats, added sugars, and salt. Eat the right amount of calories for you.Get information about a proper diet from your health care provider, if necessary.  Regular physical exercise is one of the most important things you can do for your health. Most adults should get at least 150 minutes of moderate-intensity exercise (any activity that increases your heart  rate and causes you to sweat) each week. In addition, most adults need muscle-strengthening exercises on 2 or more days a week.  Maintain a healthy weight. The body mass index (BMI) is a screening tool to identify possible weight problems. It provides an estimate of body fat based on height and weight. Your health care provider can find your BMI and can help you achieve or maintain a healthy weight.For adults 20 years and older:  A BMI below 18.5 is considered underweight.  A BMI of 18.5 to 24.9 is normal.  A BMI of 25 to 29.9 is considered overweight.  A BMI of 30 and above is considered obese.  Maintain normal blood lipids and cholesterol levels by exercising and minimizing your intake of saturated fat. Eat a balanced diet with plenty of fruit and vegetables. Blood tests for lipids and cholesterol should begin at age 48 and be repeated every 5 years. If your lipid or cholesterol levels are high, you are over 50, or you are at high risk for heart disease, you may need your cholesterol levels checked more frequently.Ongoing high lipid and cholesterol levels should be treated with medicines if diet and exercise are not working.  If you smoke, find out from your health care provider how to quit. If you do not use tobacco, do not start.  Lung cancer screening is recommended for adults aged 10-80 years who are at high risk for developing lung cancer because of a history of smoking. A yearly low-dose CT scan of the lungs is recommended for people who have at least a 30-pack-year history  of smoking and are a current smoker or have quit within the past 15 years. A pack year of smoking is smoking an average of 1 pack of cigarettes a day for 1 year (for example: 1 pack a day for 30 years or 2 packs a day for 15 years). Yearly screening should continue until the smoker has stopped smoking for at least 15 years. Yearly screening should be stopped for people who develop a health problem that would prevent them  from having lung cancer treatment.  If you are pregnant, do not drink alcohol. If you are breastfeeding, be very cautious about drinking alcohol. If you are not pregnant and choose to drink alcohol, do not have more than 1 drink per day. One drink is considered to be 12 ounces (355 mL) of beer, 5 ounces (148 mL) of wine, or 1.5 ounces (44 mL) of liquor.  Avoid use of street drugs. Do not share needles with anyone. Ask for help if you need support or instructions about stopping the use of drugs.  High blood pressure causes heart disease and increases the risk of stroke. Your blood pressure should be checked at least every 1 to 2 years. Ongoing high blood pressure should be treated with medicines if weight loss and exercise do not work.  If you are 24-43 years old, ask your health care provider if you should take aspirin to prevent strokes.  Diabetes screening is done by taking a blood sample to check your blood glucose level after you have not eaten for a certain period of time (fasting). If you are not overweight and you do not have risk factors for diabetes, you should be screened once every 3 years starting at age 2. If you are overweight or obese and you are 26-89 years of age, you should be screened for diabetes every year as part of your cardiovascular risk assessment.  Breast cancer screening is essential preventive care for women. You should practice "breast self-awareness." This means understanding the normal appearance and feel of your breasts and may include breast self-examination. Any changes detected, no matter how small, should be reported to a health care provider. Women in their 81s and 30s should have a clinical breast exam (CBE) by a health care provider as part of a regular health exam every 1 to 3 years. After age 16, women should have a CBE every year. Starting at age 50, women should consider having a mammogram (breast X-ray test) every year. Women who have a family history of  breast cancer should talk to their health care provider about genetic screening. Women at a high risk of breast cancer should talk to their health care providers about having an MRI and a mammogram every year.  Breast cancer gene (BRCA)-related cancer risk assessment is recommended for women who have family members with BRCA-related cancers. BRCA-related cancers include breast, ovarian, tubal, and peritoneal cancers. Having family members with these cancers may be associated with an increased risk for harmful changes (mutations) in the breast cancer genes BRCA1 and BRCA2. Results of the assessment will determine the need for genetic counseling and BRCA1 and BRCA2 testing.  Your health care provider may recommend that you be screened regularly for cancer of the pelvic organs (ovaries, uterus, and vagina). This screening involves a pelvic examination, including checking for microscopic changes to the surface of your cervix (Pap test). You may be encouraged to have this screening done every 3 years, beginning at age 98.  For women ages 63-65, health  care providers may recommend pelvic exams and Pap testing every 3 years, or they may recommend the Pap and pelvic exam, combined with testing for human papilloma virus (HPV), every 5 years. Some types of HPV increase your risk of cervical cancer. Testing for HPV may also be done on women of any age with unclear Pap test results.  Other health care providers may not recommend any screening for nonpregnant women who are considered low risk for pelvic cancer and who do not have symptoms. Ask your health care provider if a screening pelvic exam is right for you.  If you have had past treatment for cervical cancer or a condition that could lead to cancer, you need Pap tests and screening for cancer for at least 20 years after your treatment. If Pap tests have been discontinued, your risk factors (such as having a new sexual partner) need to be reassessed to determine  if screening should resume. Some women have medical problems that increase the chance of getting cervical cancer. In these cases, your health care provider may recommend more frequent screening and Pap tests.  Colorectal cancer can be detected and often prevented. Most routine colorectal cancer screening begins at the age of 49 years and continues through age 3 years. However, your health care provider may recommend screening at an earlier age if you have risk factors for colon cancer. On a yearly basis, your health care provider may provide home test kits to check for hidden blood in the stool. Use of a small camera at the end of a tube, to directly examine the colon (sigmoidoscopy or colonoscopy), can detect the earliest forms of colorectal cancer. Talk to your health care provider about this at age 2, when routine screening begins. Direct exam of the colon should be repeated every 5-10 years through age 65 years, unless early forms of precancerous polyps or small growths are found.  People who are at an increased risk for hepatitis B should be screened for this virus. You are considered at high risk for hepatitis B if:  You were born in a country where hepatitis B occurs often. Talk with your health care provider about which countries are considered high risk.  Your parents were born in a high-risk country and you have not received a shot to protect against hepatitis B (hepatitis B vaccine).  You have HIV or AIDS.  You use needles to inject street drugs.  You live with, or have sex with, someone who has hepatitis B.  You get hemodialysis treatment.  You take certain medicines for conditions like cancer, organ transplantation, and autoimmune conditions.  Hepatitis C blood testing is recommended for all people born from [birth year] through 1963 and any individual with known risks for hepatitis C.  Practice safe sex. Use condoms and avoid high-risk sexual practices to reduce the spread of sexually  transmitted infections (STIs). STIs include gonorrhea, chlamydia, syphilis, trichomonas, herpes, HPV, and human immunodeficiency virus (HIV). Herpes, HIV, and HPV are viral illnesses that have no cure. They can result in disability, cancer, and death.  You should be screened for sexually transmitted illnesses (STIs) including gonorrhea and chlamydia if:  You are sexually active and are younger than 24 years.  You are older than 24 years and your health care provider tells you that you are at risk for this type of infection.  Your sexual activity has changed since you were last screened and you are at an increased risk for chlamydia or gonorrhea. Ask your health care  provider if you are at risk.  If you are at risk of being infected with HIV, it is recommended that you take a prescription medicine daily to prevent HIV infection. This is called preexposure prophylaxis (PrEP). You are considered at risk if:  You are sexually active and do not regularly use condoms or know the HIV status of your partner(s).  You take drugs by injection.  You are sexually active with a partner who has HIV.  Talk with your health care provider about whether you are at high risk of being infected with HIV. If you choose to begin PrEP, you should first be tested for HIV. You should then be tested every 3 months for as long as you are taking PrEP.  Osteoporosis is a disease in which the bones lose minerals and strength with aging. This can result in serious bone fractures or breaks. The risk of osteoporosis can be identified using a bone density scan. Women ages 66 years and over and women at risk for fractures or osteoporosis should discuss screening with their health care providers. Ask your health care provider whether you should take a calcium supplement or vitamin D to reduce the rate of osteoporosis.  Menopause can be associated with physical symptoms and risks. Hormone replacement therapy is available to decrease  symptoms and risks. You should talk to your health care provider about whether hormone replacement therapy is right for you.  Use sunscreen. Apply sunscreen liberally and repeatedly throughout the day. You should seek shade when your shadow is shorter than you. Protect yourself by wearing long sleeves, pants, a wide-brimmed hat, and sunglasses year round, whenever you are outdoors.  Once a month, do a whole body skin exam, using a mirror to look at the skin on your back. Tell your health care provider of new moles, moles that have irregular borders, moles that are larger than a pencil eraser, or moles that have changed in shape or color.  Stay current with required vaccines (immunizations).  Influenza vaccine. All adults should be immunized every year.  Tetanus, diphtheria, and acellular pertussis (Td, Tdap) vaccine. Pregnant women should receive 1 dose of Tdap vaccine during each pregnancy. The dose should be obtained regardless of the length of time since the last dose. Immunization is preferred during the 27th-36th week of gestation. An adult who has not previously received Tdap or who does not know her vaccine status should receive 1 dose of Tdap. This initial dose should be followed by tetanus and diphtheria toxoids (Td) booster doses every 10 years. Adults with an unknown or incomplete history of completing a 3-dose immunization series with Td-containing vaccines should begin or complete a primary immunization series including a Tdap dose. Adults should receive a Td booster every 10 years.  Varicella vaccine. An adult without evidence of immunity to varicella should receive 2 doses or a second dose if she has previously received 1 dose. Pregnant females who do not have evidence of immunity should receive the first dose after pregnancy. This first dose should be obtained before leaving the health care facility. The second dose should be obtained 4-8 weeks after the first dose.  Human  papillomavirus (HPV) vaccine. Females aged 13-26 years who have not received the vaccine previously should obtain the 3-dose series. The vaccine is not recommended for use in pregnant females. However, pregnancy testing is not needed before receiving a dose. If a female is found to be pregnant after receiving a dose, no treatment is needed. In that case,  the remaining doses should be delayed until after the pregnancy. Immunization is recommended for any person with an immunocompromised condition through the age of 42 years if she did not get any or all doses earlier. During the 3-dose series, the second dose should be obtained 4-8 weeks after the first dose. The third dose should be obtained 24 weeks after the first dose and 16 weeks after the second dose.  Zoster vaccine. One dose is recommended for adults aged 66 years or older unless certain conditions are present.  Measles, mumps, and rubella (MMR) vaccine. Adults born before [birth year] generally are considered immune to measles and mumps. Adults born in [birth year] or later should have 1 or more doses of MMR vaccine unless there is a contraindication to the vaccine or there is laboratory evidence of immunity to each of the three diseases. A routine second dose of MMR vaccine should be obtained at least 28 days after the first dose for students attending postsecondary schools, health care workers, or international travelers. People who received inactivated measles vaccine or an unknown type of measles vaccine during 1963-1967 should receive 2 doses of MMR vaccine. People who received inactivated mumps vaccine or an unknown type of mumps vaccine before 1979 and are at high risk for mumps infection should consider immunization with 2 doses of MMR vaccine. For females of childbearing age, rubella immunity should be determined. If there is no evidence of immunity, females who are not pregnant should be vaccinated. If there is no evidence of immunity, females who are pregnant  should delay immunization until after pregnancy. Unvaccinated health care workers born before [birth year] who lack laboratory evidence of measles, mumps, or rubella immunity or laboratory confirmation of disease should consider measles and mumps immunization with 2 doses of MMR vaccine or rubella immunization with 1 dose of MMR vaccine.  Pneumococcal 13-valent conjugate (PCV13) vaccine. When indicated, a person who is uncertain of his immunization history and has no record of immunization should receive the PCV13 vaccine. All adults 28 years of age and older should receive this vaccine. An adult aged 87 years or older who has certain medical conditions and has not been previously immunized should receive 1 dose of PCV13 vaccine. This PCV13 should be followed with a dose of pneumococcal polysaccharide (PPSV23) vaccine. Adults who are at high risk for pneumococcal disease should obtain the PPSV23 vaccine at least 8 weeks after the dose of PCV13 vaccine. Adults older than 63 years of age who have normal immune system function should obtain the PPSV23 vaccine dose at least 1 year after the dose of PCV13 vaccine.  Pneumococcal polysaccharide (PPSV23) vaccine. When PCV13 is also indicated, PCV13 should be obtained first. All adults aged 82 years and older should be immunized. An adult younger than age 2 years who has certain medical conditions should be immunized. Any person who resides in a nursing home or long-term care facility should be immunized. An adult smoker should be immunized. People with an immunocompromised condition and certain other conditions should receive both PCV13 and PPSV23 vaccines. People with human immunodeficiency virus (HIV) infection should be immunized as soon as possible after diagnosis. Immunization during chemotherapy or radiation therapy should be avoided. Routine use of PPSV23 vaccine is not recommended for American Indians, [place] Natives, or people younger than 65 years unless there are  medical conditions that require PPSV23 vaccine. When indicated, people who have unknown immunization and have no record of immunization should receive PPSV23 vaccine. One-time revaccination 5 years after the first  dose of PPSV23 is recommended for people aged 19-64 years who have chronic kidney failure, nephrotic syndrome, asplenia, or immunocompromised conditions. People who received 1-2 doses of PPSV23 before age 77 years should receive another dose of PPSV23 vaccine at age 63 years or later if at least 5 years have passed since the previous dose. Doses of PPSV23 are not needed for people immunized with PPSV23 at or after age 24 years.  Meningococcal vaccine. Adults with asplenia or persistent complement component deficiencies should receive 2 doses of quadrivalent meningococcal conjugate (MenACWY-D) vaccine. The doses should be obtained at least 2 months apart. Microbiologists working with certain meningococcal bacteria, [employer] recruits, people at risk during an outbreak, and people who travel to or live in countries with a high rate of meningitis should be immunized. A first-year college student up through age 38 years who is living in a residence hall should receive a dose if she did not receive a dose on or after her 16th birthday. Adults who have certain high-risk conditions should receive one or more doses of vaccine.  Hepatitis A vaccine. Adults who wish to be protected from this disease, have certain high-risk conditions, work with hepatitis A-infected animals, work in hepatitis A research labs, or travel to or work in countries with a high rate of hepatitis A should be immunized. Adults who were previously unvaccinated and who anticipate close contact with an international adoptee during the first 60 days after arrival in the [place] from a country with a high rate of hepatitis A should be immunized.  Hepatitis B vaccine. Adults who wish to be protected from this disease, have certain  high-risk conditions, may be exposed to blood or other infectious body fluids, are household contacts or sex partners of hepatitis B positive people, are clients or workers in certain care facilities, or travel to or work in countries with a high rate of hepatitis B should be immunized.  Haemophilus influenzae type b (Hib) vaccine. A previously unvaccinated person with asplenia or sickle cell disease or having a scheduled splenectomy should receive 1 dose of Hib vaccine. Regardless of previous immunization, a recipient of a hematopoietic stem cell transplant should receive a 3-dose series 6-12 months after her successful transplant. Hib vaccine is not recommended for adults with HIV infection. Preventive Services / Frequency Ages 61 to 74 years  Blood pressure check.** / Every 3-5 years.  Lipid and cholesterol check.** / Every 5 years beginning at age 18.  Clinical breast exam.** / Every 3 years for women in their 33s and 35s.  BRCA-related cancer risk assessment.** / For women who have family members with a BRCA-related cancer (breast, ovarian, tubal, or peritoneal cancers).  Pap test.** / Every 2 years from ages 59 through 46. Every 3 years starting at age 82 through age 42 or 85 with a history of 3 consecutive normal Pap tests.  HPV screening.** / Every 3 years from ages 19 through ages 25 to 60 with a history of 3 consecutive normal Pap tests.  Hepatitis C blood test.** / For any individual with known risks for hepatitis C.  Skin self-exam. / Monthly.  Influenza vaccine. / Every year.  Tetanus, diphtheria, and acellular pertussis (Tdap, Td) vaccine.** / Consult your health care provider. Pregnant women should receive 1 dose of Tdap vaccine during each pregnancy. 1 dose of Td every 10 years.  Varicella vaccine.** / Consult your health care provider. Pregnant females who do not have evidence of immunity should receive the first dose after  pregnancy.  HPV vaccine. / 3 doses over 6  months, if 29 and younger. The vaccine is not recommended for use in pregnant females. However, pregnancy testing is not needed before receiving a dose.  Measles, mumps, rubella (MMR) vaccine.** / You need at least 1 dose of MMR if you were born in 1957 or later. You may also need a 2nd dose. For females of childbearing age, rubella immunity should be determined. If there is no evidence of immunity, females who are not pregnant should be vaccinated. If there is no evidence of immunity, females who are pregnant should delay immunization until after pregnancy.  Pneumococcal 13-valent conjugate (PCV13) vaccine.** / Consult your health care provider.  Pneumococcal polysaccharide (PPSV23) vaccine.** / 1 to 2 doses if you smoke cigarettes or if you have certain conditions.  Meningococcal vaccine.** / 1 dose if you are age 52 to 61 years and a Market researcher living in a residence hall, or have one of several medical conditions, you need to get vaccinated against meningococcal disease. You may also need additional booster doses.  Hepatitis A vaccine.** / Consult your health care provider.  Hepatitis B vaccine.** / Consult your health care provider.  Haemophilus influenzae type b (Hib) vaccine.** / Consult your health care provider. Ages 29 to 70 years  Blood pressure check.** / Every year.  Lipid and cholesterol check.** / Every 5 years beginning at age 78 years.  Lung cancer screening. / Every year if you are aged 31-80 years and have a 30-pack-year history of smoking and currently smoke or have quit within the past 15 years. Yearly screening is stopped once you have quit smoking for at least 15 years or develop a health problem that would prevent you from having lung cancer treatment.  Clinical breast exam.** / Every year after age 27 years.  BRCA-related cancer risk assessment.** / For women who have family members with a BRCA-related cancer (breast, ovarian, tubal, or peritoneal  cancers).  Mammogram.** / Every year beginning at age 73 years and continuing for as long as you are in good health. Consult with your health care provider.  Pap test.** / Every 3 years starting at age 48 years through age 44 or 43 years with a history of 3 consecutive normal Pap tests.  HPV screening.** / Every 3 years from ages 9 years through ages 68 to 37 years with a history of 3 consecutive normal Pap tests.  Fecal occult blood test (FOBT) of stool. / Every year beginning at age 78 years and continuing until age 32 years. You may not need to do this test if you get a colonoscopy every 10 years.  Flexible sigmoidoscopy or colonoscopy.** / Every 5 years for a flexible sigmoidoscopy or every 10 years for a colonoscopy beginning at age 63 years and continuing until age 64 years.  Hepatitis C blood test.** / For all people born from [birth year] through 1963 and any individual with known risks for hepatitis C.  Skin self-exam. / Monthly.  Influenza vaccine. / Every year.  Tetanus, diphtheria, and acellular pertussis (Tdap/Td) vaccine.** / Consult your health care provider. Pregnant women should receive 1 dose of Tdap vaccine during each pregnancy. 1 dose of Td every 10 years.  Varicella vaccine.** / Consult your health care provider. Pregnant females who do not have evidence of immunity should receive the first dose after pregnancy.  Zoster vaccine.** / 1 dose for adults aged 66 years or older.  Measles, mumps, rubella (MMR) vaccine.** / You need  at least 1 dose of MMR if you were born in 1957 or later. You may also need a second dose. For females of childbearing age, rubella immunity should be determined. If there is no evidence of immunity, females who are not pregnant should be vaccinated. If there is no evidence of immunity, females who are pregnant should delay immunization until after pregnancy.  Pneumococcal 13-valent conjugate (PCV13) vaccine.** / Consult your health care  provider.  Pneumococcal polysaccharide (PPSV23) vaccine.** / 1 to 2 doses if you smoke cigarettes or if you have certain conditions.  Meningococcal vaccine.** / Consult your health care provider.  Hepatitis A vaccine.** / Consult your health care provider.  Hepatitis B vaccine.** / Consult your health care provider.  Haemophilus influenzae type b (Hib) vaccine.** / Consult your health care provider. Ages 31 years and over  Blood pressure check.** / Every year.  Lipid and cholesterol check.** / Every 5 years beginning at age 90 years.  Lung cancer screening. / Every year if you are aged 31-80 years and have a 30-pack-year history of smoking and currently smoke or have quit within the past 15 years. Yearly screening is stopped once you have quit smoking for at least 15 years or develop a health problem that would prevent you from having lung cancer treatment.  Clinical breast exam.** / Every year after age 87 years.  BRCA-related cancer risk assessment.** / For women who have family members with a BRCA-related cancer (breast, ovarian, tubal, or peritoneal cancers).  Mammogram.** / Every year beginning at age 18 years and continuing for as long as you are in good health. Consult with your health care provider.  Pap test.** / Every 3 years starting at age 7 years through age 9 or 80 years with 3 consecutive normal Pap tests. Testing can be stopped between 65 and 70 years with 3 consecutive normal Pap tests and no abnormal Pap or HPV tests in the past 10 years.  HPV screening.** / Every 3 years from ages 83 years through ages 60 or 67 years with a history of 3 consecutive normal Pap tests. Testing can be stopped between 65 and 70 years with 3 consecutive normal Pap tests and no abnormal Pap or HPV tests in the past 10 years.  Fecal occult blood test (FOBT) of stool. / Every year beginning at age 84 years and continuing until age 3 years. You may not need to do this test if you get a  colonoscopy every 10 years.  Flexible sigmoidoscopy or colonoscopy.** / Every 5 years for a flexible sigmoidoscopy or every 10 years for a colonoscopy beginning at age 71 years and continuing until age 39 years.  Hepatitis C blood test.** / For all people born from [birth year] through 1963 and any individual with known risks for hepatitis C.  Osteoporosis screening.** / A one-time screening for women ages 27 years and over and women at risk for fractures or osteoporosis.  Skin self-exam. / Monthly.  Influenza vaccine. / Every year.  Tetanus, diphtheria, and acellular pertussis (Tdap/Td) vaccine.** / 1 dose of Td every 10 years.  Varicella vaccine.** / Consult your health care provider.  Zoster vaccine.** / 1 dose for adults aged 13 years or older.  Pneumococcal 13-valent conjugate (PCV13) vaccine.** / Consult your health care provider.  Pneumococcal polysaccharide (PPSV23) vaccine.** / 1 dose for all adults aged 28 years and older.  Meningococcal vaccine.** / Consult your health care provider.  Hepatitis A vaccine.** / Consult your health care provider.  Hepatitis  B vaccine.** / Consult your health care provider.  Haemophilus influenzae type b (Hib) vaccine.** / Consult your health care provider. ** Family history and personal history of risk and conditions may change your health care provider's recommendations.   This information is not intended to replace advice given to you by your health care provider. Make sure you discuss any questions you have with your health care provider.   Document Released: 06/21/2001 Document Revised: 05/16/2014 Document Reviewed: 09/20/2010 Elsevier Interactive Patient Education [employer].

## 2015-08-25 NOTE — Assessment & Plan Note (Signed)
Stable. Asymptomatic. Will check metabolic panel today.

## 2015-08-25 NOTE — Progress Notes (Signed)
Patient presents to clinic today for annual exam.  Patient is fasting for labs.  Acute Concerns: Denies acute concerns today. Body mass index is 26.84 kg/(m^2).  Chronic Issues:  Hypertension -- Is currently on a regimen of lisinopril-Hydrochlorothiazide.  Endorses taking medications as directed. Patient denies chest pain, palpitations, lightheadedness, dizziness, vision changes or frequent headaches.  BP Readings from Last 3 Encounters:  08/25/15 130/76  07/14/15 132/69  02/17/15 134/72   Health Maintenance: Immunizations --  Unsure of Tetanus.  Colonoscopy -- Refuses colon cancer screening.  Mammogram -- Denies ever having mammogram. Order placed for screening mammogram. Has been done previously but patient has not followed through.  PAP -- Denies history of abnormal pap. Is s/p hysterectomy.   Past Medical History  Diagnosis Date  . Seizure (HCC)   . HA (headache)   . Viral encephalitis   . Viral meningitis   . Hypertension     Past Surgical History  Procedure Laterality Date  . Abdominal hysterectomy      Current Outpatient Prescriptions on File Prior to Visit  Medication Sig Dispense Refill  . baclofen (LIORESAL) 10 MG tablet Take 10 mg by mouth 3 (three) times daily.    . carbamazepine (TEGRETOL) 100 MG chewable tablet Chew 300 mg by mouth 2 (two) times daily.    . Cholecalciferol (VITAMIN D-3) 1000 UNITS CAPS Take by mouth daily.    Marland Kitchen lisinopril-hydrochlorothiazide (PRINZIDE,ZESTORETIC) 10-12.5 MG tablet Take 1 tablet by mouth daily. 90 tablet 1  . Multiple Minerals-Vitamins (CALCIUM & VIT D3 BONE HEALTH PO) Take by mouth daily.    . naproxen (NAPROSYN) 500 MG tablet Take 1 tablet (500 mg total) by mouth 2 (two) times daily as needed (for shoulder pain). 20 tablet 0  . pantoprazole (PROTONIX) 40 MG tablet TAKE 1 TABLET BY MOUTH EVERY DAY AT 6:00AM 90 tablet 0   No current facility-administered medications on file prior to visit.    No Known  Allergies  Family History  Problem Relation Age of Onset  . Hypertension Mother   . Heart disease Mother   . Diabetes Mother   . Hypertension Father   . Alzheimer's disease Father   . Diabetes Maternal Grandmother   . Hypertension Maternal Aunt   . Diabetes Maternal Aunt   . Hypertension Sister   . Healthy Brother     x3  . Healthy Daughter     x4  . Seizures Daughter   . Healthy Son     x1    Social History   Social History  . Marital Status: Single    Spouse Name: N/A  . Number of Children: N/A  . Years of Education: N/A   Occupational History  . Not on file.   Social History Main Topics  . Smoking status: Never Smoker   . Smokeless tobacco: Never Used  . Alcohol Use: No  . Drug Use: No  . Sexual Activity: No   Other Topics Concern  . Not on file   Social History Narrative   Review of Systems  Constitutional: Negative for fever and weight loss.  HENT: Negative for ear discharge, ear pain, hearing loss and tinnitus.   Eyes: Negative for blurred vision, double vision, photophobia and pain.  Respiratory: Negative for cough and shortness of breath.   Cardiovascular: Negative for chest pain and palpitations.  Gastrointestinal: Negative for heartburn, nausea, vomiting, abdominal pain, diarrhea, constipation, blood in stool and melena.  Genitourinary: Negative for dysuria, urgency, frequency, hematuria and flank  pain.  Musculoskeletal: Negative for falls.  Neurological: Negative for dizziness, loss of consciousness and headaches.  Endo/Heme/Allergies: Negative for environmental allergies.  Psychiatric/Behavioral: Negative for depression, suicidal ideas, hallucinations and substance abuse. The patient is not nervous/anxious and does not have insomnia.     BP 130/76 mmHg  Pulse 95  Temp(Src) 99.1 F (37.3 C) (Oral)  Wt 142 lb (64.411 kg)  SpO2 98%  Physical Exam  Constitutional: She is oriented to person, place, and time and well-developed, well-nourished,  and in no distress.  HENT:  Head: Normocephalic and atraumatic.  Right Ear: Tympanic membrane, external ear and ear canal normal.  Left Ear: Tympanic membrane, external ear and ear canal normal.  Nose: Nose normal. No mucosal edema.  Mouth/Throat: Uvula is midline, oropharynx is clear and moist and mucous membranes are normal. No oropharyngeal exudate or posterior oropharyngeal erythema.  Eyes: Conjunctivae are normal. Pupils are equal, round, and reactive to light.  Neck: Neck supple. No thyromegaly present.  Cardiovascular: Normal rate, regular rhythm, normal heart sounds and intact distal pulses.   Pulmonary/Chest: Effort normal and breath sounds normal. No respiratory distress. She has no wheezes. She has no rales.  Abdominal: Soft. Bowel sounds are normal. She exhibits no distension and no mass. There is no tenderness. There is no rebound and no guarding.  Lymphadenopathy:    She has no cervical adenopathy.  Neurological: She is alert and oriented to person, place, and time. No cranial nerve deficit.  Skin: Skin is warm and dry. No rash noted.  Psychiatric: Affect normal.  Vitals reviewed.  Assessment/Plan: Visit for preventive health examination Depression screen negative. Health Maintenance reviewed -- Declines tetanus. Agrees to mammogram (repeat order placed). Declines colonoscopy, Cologuard, IFOB tests AMA. Preventive schedule discussed and handout given in AVS. Will obtain fasting labs today.   Essential hypertension, benign Stable. Asymptomatic. Will check metabolic panel today.

## 2015-08-27 ENCOUNTER — Other Ambulatory Visit (INDEPENDENT_AMBULATORY_CARE_PROVIDER_SITE_OTHER): Payer: Medicaid Other

## 2015-08-27 DIAGNOSIS — Z0000 Encounter for general adult medical examination without abnormal findings: Secondary | ICD-10-CM | POA: Diagnosis not present

## 2015-08-27 NOTE — Addendum Note (Signed)
Addended by: Harley Alto on: 08/27/2015 03:44 PM   Modules accepted: Orders

## 2015-08-28 LAB — URINALYSIS, ROUTINE W REFLEX MICROSCOPIC
BILIRUBIN URINE: NEGATIVE
Hgb urine dipstick: NEGATIVE
Ketones, ur: NEGATIVE
LEUKOCYTES UA: NEGATIVE
NITRITE: NEGATIVE
Specific Gravity, Urine: 1.01 (ref 1.000–1.030)
TOTAL PROTEIN, URINE-UPE24: NEGATIVE
URINE GLUCOSE: NEGATIVE
Urobilinogen, UA: 0.2 (ref 0.0–1.0)
pH: 6 (ref 5.0–8.0)

## 2015-09-05 ENCOUNTER — Telehealth: Payer: Self-pay | Admitting: Physician Assistant

## 2015-09-11 MED ORDER — PANTOPRAZOLE SODIUM 40 MG PO TBEC: DELAYED_RELEASE_TABLET | ORAL | Status: DC

## 2015-09-11 NOTE — Telephone Encounter (Signed)
Patient states pharmacy never received pantoprazole (PROTONIX) 40 MG tablet and would like office to call pharmacy directly

## 2015-09-11 NOTE — Telephone Encounter (Signed)
Spoke with patient to verify if she was wanting Rx to go to [facility] or [facility] as she has both listed in chart, she stated [facility]; this is where it was previously sent so resend via escribe with note to D/C previous scripts/SLS 05/05

## 2015-09-11 NOTE — Addendum Note (Signed)
Addended by: Regis Bill on: 09/11/2015 05:04 PM   Modules accepted: Orders, Medications

## 2015-10-02 ENCOUNTER — Other Ambulatory Visit: Payer: Self-pay | Admitting: Physician Assistant

## 2015-11-20 ENCOUNTER — Telehealth: Payer: Self-pay | Admitting: Physician Assistant

## 2015-11-20 NOTE — Telephone Encounter (Signed)
Dr. Everlena Cooper marked end date and discontinued on 02/17/15 as Completed Course/SLS 07/14

## 2015-11-20 NOTE — Telephone Encounter (Signed)
Refill request for baclofen   Pharmacy: [facility] on [address]

## 2015-11-20 NOTE — Telephone Encounter (Signed)
She would need to call his office then for refill.

## 2015-11-23 NOTE — Telephone Encounter (Signed)
LMOM with contact name and number [for return call, if needed] RE: requested medication and further provider instructions/SLS 07/17

## 2016-04-01 ENCOUNTER — Other Ambulatory Visit: Payer: Self-pay | Admitting: Physician Assistant

## 2016-04-05 ENCOUNTER — Other Ambulatory Visit: Payer: Self-pay | Admitting: Physician Assistant

## 2016-06-17 ENCOUNTER — Other Ambulatory Visit: Payer: Self-pay | Admitting: Physician Assistant

## 2016-06-17 ENCOUNTER — Telehealth: Payer: Self-pay | Admitting: Physician Assistant

## 2016-06-17 ENCOUNTER — Encounter: Payer: Self-pay | Admitting: Physician Assistant

## 2016-06-17 NOTE — Telephone Encounter (Signed)
Patient needs a refill on BP medication. She is out.  [id number] - [place], [place] - [address] AT [facility] [address] [phone] (Phone) [phone] (Fax)   Thank you.

## 2016-06-17 NOTE — Telephone Encounter (Signed)
Ok with me 

## 2016-06-17 NOTE — Telephone Encounter (Signed)
Reviewed chart -- Seems that CMA that results were sent to in April of last year only attempted to reach patient once. No further attempts made or messages sent to me to question next steps.  She needs referral to Endocrinology -- ok to place. To assess low TSH.  No further fills of her medication as she was told she needed follow-up at last fill in November.  Also needs reassessment of cholesterol and to start medication.   Please contact patient and encourage her to schedule closer follow-up with Ramon Dredge who she is transferring to or she would need to see me.  I am sending a message to management regarding this.

## 2016-06-17 NOTE — Telephone Encounter (Signed)
Patient is requesting a transfer of care from [facility] to [facility]. Please advise

## 2016-06-17 NOTE — Telephone Encounter (Signed)
After researching patient chart. Per her last visit and lab results. Patient was suppose to start Klor-con 20 meq bid not sent to pharmacy, Endocrinology referral never placed for abnormal thyroid, anemia never rechecked or Lipitor 20 mg never started for elevated cholesterol. She has pending appt with Ramon Dredge on 06/23/16 Lisinopril/HCTZ last filled 04/05/16 #30 was given until appt with Ramon Dredge. Please advise

## 2016-06-17 NOTE — Telephone Encounter (Signed)
°  Relation to SA:YTKZ Call back number:[phone] Pharmacy: [id number] - [place], [place] - [address] AT [facility] OF [address] [phone] (Phone) [phone] (Fax)     Reason for call:  Patient requesting a refill lisinopril-hydrochlorothiazide (PRINZIDE,ZESTORETIC) 10-12.5 MG tablet

## 2016-06-17 NOTE — Telephone Encounter (Signed)
Ok to switch when she actually comes in for first visit. Not before. Since I have never seen her. When actually is in for visit. Then make switch. Not sooner.

## 2016-06-17 NOTE — Telephone Encounter (Signed)
Spoke with patient daughter Debbe Odea about refill of medication. Advised patient she should have been out medication in Dec. Keep scheduled appt with Ramon Dredge for evaluation.

## 2016-06-17 NOTE — Telephone Encounter (Signed)
See other note. Per cody decline filling medication until appt with Ramon Dredge

## 2016-06-20 ENCOUNTER — Telehealth: Payer: Self-pay | Admitting: Behavioral Health

## 2016-06-20 NOTE — Telephone Encounter (Signed)
Patient scheduled for 06/23/2016

## 2016-06-20 NOTE — Telephone Encounter (Signed)
Patient rescheduled appointment for 06/23/16 at 9:15 AM with Esperanza Richters, PA-C.

## 2016-06-21 ENCOUNTER — Ambulatory Visit: Payer: Medicaid Other | Admitting: Medical

## 2016-06-23 ENCOUNTER — Ambulatory Visit: Payer: Medicaid Other | Admitting: Medical

## 2016-06-27 ENCOUNTER — Telehealth: Payer: Self-pay | Admitting: Behavioral Health

## 2016-06-27 NOTE — Telephone Encounter (Signed)
Unable to reach patient at time of Pre-Visit Call.  Left message for patient to return call when available.    

## 2016-06-28 ENCOUNTER — Ambulatory Visit: Payer: Medicaid Other | Admitting: Medical

## 2016-06-29 ENCOUNTER — Encounter: Payer: Self-pay | Admitting: Medical

## 2016-06-29 ENCOUNTER — Ambulatory Visit (INDEPENDENT_AMBULATORY_CARE_PROVIDER_SITE_OTHER): Payer: Medicaid Other | Admitting: Medical

## 2016-06-29 VITALS — BP 146/96 | HR 82 | Temp 99.4°F | Resp 16 | Ht 61.0 in | Wt 148.0 lb

## 2016-06-29 DIAGNOSIS — I10 Essential (primary) hypertension: Secondary | ICD-10-CM | POA: Diagnosis not present

## 2016-06-29 DIAGNOSIS — Z8661 Personal history of infections of the central nervous system: Secondary | ICD-10-CM | POA: Diagnosis not present

## 2016-06-29 DIAGNOSIS — K219 Gastro-esophageal reflux disease without esophagitis: Secondary | ICD-10-CM | POA: Diagnosis not present

## 2016-06-29 LAB — CBC WITH DIFFERENTIAL/PLATELET
BASOS ABS: 0 10*3/uL (ref 0.0–0.1)
Basophils Relative: 0.3 % (ref 0.0–3.0)
Eosinophils Absolute: 0 10*3/uL (ref 0.0–0.7)
Eosinophils Relative: 0.4 % (ref 0.0–5.0)
HCT: 33.9 % — ABNORMAL LOW (ref 36.0–46.0)
HEMOGLOBIN: 11.4 g/dL — AB (ref 12.0–15.0)
LYMPHS ABS: 1 10*3/uL (ref 0.7–4.0)
Lymphocytes Relative: 16.5 % (ref 12.0–46.0)
MCHC: 33.5 g/dL (ref 30.0–36.0)
MCV: 92.9 fl (ref 78.0–100.0)
MONO ABS: 0.5 10*3/uL (ref 0.1–1.0)
MONOS PCT: 8.6 % (ref 3.0–12.0)
NEUTROS PCT: 74.2 % (ref 43.0–77.0)
Neutro Abs: 4.4 10*3/uL (ref 1.4–7.7)
Platelets: 318 10*3/uL (ref 150.0–400.0)
RBC: 3.65 Mil/uL — AB (ref 3.87–5.11)
RDW: 14.6 % (ref 11.5–15.5)
WBC: 5.9 10*3/uL (ref 4.0–10.5)

## 2016-06-29 LAB — LIPID PANEL
CHOLESTEROL: 258 mg/dL — AB (ref 0–200)
HDL: 61.2 mg/dL (ref >39.00)
LDL Cholesterol: 172 mg/dL — ABNORMAL HIGH (ref 0–99)
NonHDL: 196.72
TRIGLYCERIDES: 124 mg/dL (ref 0.0–149.0)
Total CHOL/HDL Ratio: 4
VLDL: 24.8 mg/dL (ref 0.0–40.0)

## 2016-06-29 LAB — COMPREHENSIVE METABOLIC PANEL
ALBUMIN: 4.2 g/dL (ref 3.5–5.2)
ALK PHOS: 101 U/L (ref 39–117)
ALT: 17 U/L (ref 0–35)
AST: 16 U/L (ref 0–37)
BILIRUBIN TOTAL: 0.2 mg/dL (ref 0.2–1.2)
BUN: 7 mg/dL (ref 6–23)
CO2: 28 mEq/L (ref 19–32)
Calcium: 9.3 mg/dL (ref 8.4–10.5)
Chloride: 104 mEq/L (ref 96–112)
Creatinine, Ser: 0.51 mg/dL (ref 0.40–1.20)
GFR: 156.45 mL/min (ref >60.00)
GLUCOSE: 129 mg/dL — AB (ref 70–99)
Potassium: 3.2 mEq/L — ABNORMAL LOW (ref 3.5–5.1)
Sodium: 140 mEq/L (ref 135–145)
Total Protein: 7.1 g/dL (ref 6.0–8.3)

## 2016-06-29 MED ORDER — LISINOPRIL-HYDROCHLOROTHIAZIDE 10-12.5 MG PO TABS: 1.0000 | ORAL_TABLET | Freq: Every day | ORAL | 3 refills | Status: DC

## 2016-06-29 MED ORDER — PANTOPRAZOLE SODIUM 40 MG PO TBEC: DELAYED_RELEASE_TABLET | ORAL | 1 refills | Status: DC

## 2016-06-29 NOTE — Patient Instructions (Addendum)
Your blood pressure is elevated today but you are not on your medication. Refilled your bp med today. I wrote you a prescription for bp cuff. See if insurance will pay for monitor. If not still get and check bp daily over next week. Confirm bp less than 140/90.  Continue protonix for gerd.  Follow up with neurologist for myelitis.  Follow up date to be determined after lab review. Also on follow up will begin to update you health maintenance.

## 2016-06-29 NOTE — Progress Notes (Signed)
Subjective:    Patient ID: Taylor Wood, female    DOB: May 23, 1950, 66 y.o.   MRN: [id number]  HPI   Pt in for transfer of care.    Pt has been on disability/worked previosly at  [employer], walks a little daily. Diet moderate, drinks some caffeinated beverags 1-2 cups a day.  Pt bp elevated today little high today. Out of med since December. No cardiac or neurologic signs or symptoms.  Pt has no cuff at home.   Pt has history of transverse myelitis. Pt sees neurologist every 6 months.  Pt had one seizure in past 2-3 years ago.  Pt has has gerd hx. She is on protonix  Pt declines flu vaccine.  Pt had no mammogram, dexascan or colonoscopy done.     Review of Systems  Constitutional: Negative for chills and fatigue.  Respiratory: Negative for cough, chest tightness, shortness of breath and wheezing.   Cardiovascular: Negative for chest pain and palpitations.  Gastrointestinal: Negative for abdominal pain, diarrhea, nausea and vomiting.  Musculoskeletal: Negative for back pain, gait problem, neck pain and neck stiffness.  Skin: Negative for rash.  Neurological: Negative for dizziness, tremors and syncope.  Hematological: Negative for adenopathy. Does not bruise/bleed easily.  Psychiatric/Behavioral: Negative for behavioral problems and confusion.   Past Medical History:  Diagnosis Date  . HA (headache)   . Hypertension   . Seizure (HCC)   . Viral encephalitis   . Viral meningitis      Social History   Social History  . Marital status: Single    Spouse name: N/A  . Number of children: N/A  . Years of education: N/A   Occupational History  . Not on file.   Social History Main Topics  . Smoking status: Never Smoker  . Smokeless tobacco: Never Used  . Alcohol use No  . Drug use: No  . Sexual activity: No   Other Topics Concern  . Not on file   Social History Narrative  . No narrative on file    Past Surgical History:  Procedure Laterality Date  .  ABDOMINAL HYSTERECTOMY      Family History  Problem Relation Age of Onset  . Hypertension Mother   . Heart disease Mother   . Diabetes Mother   . Hypertension Father   . Alzheimer's disease Father   . Diabetes Maternal Grandmother   . Hypertension Maternal Aunt   . Diabetes Maternal Aunt   . Hypertension Sister   . Healthy Brother     x3  . Healthy Daughter     x4  . Seizures Daughter   . Healthy Son     x1    No Known Allergies  Current Outpatient Prescriptions on File Prior to Visit  Medication Sig Dispense Refill  . baclofen (LIORESAL) 10 MG tablet Take 10 mg by mouth 3 (three) times daily.    . carbamazepine (TEGRETOL) 100 MG chewable tablet Chew 300 mg by mouth 2 (two) times daily.    . Cholecalciferol (VITAMIN D-3) 1000 UNITS CAPS Take by mouth daily.    . Multiple Minerals-Vitamins (CALCIUM & VIT D3 BONE HEALTH PO) Take by mouth daily.    . naproxen (NAPROSYN) 500 MG tablet Take 1 tablet (500 mg total) by mouth 2 (two) times daily as needed (for shoulder pain). 20 tablet 0   No current facility-administered medications on file prior to visit.     BP (!) 146/96 (BP Location: Right Arm, Patient Position: Sitting, Cuff Size:  Normal) Comment: no bp meds today. I got 155/90 ESPAC.  Pulse 82   Temp 99.4 F (37.4 C) (Oral)   Resp 16   Ht 5\' 1"  (1.549 m)   Wt 148 lb (67.1 kg)   SpO2 100%   BMI 27.96 kg/m       Objective:   Physical Exam  General Mental Status- Alert. General Appearance- Not in acute distress.   Skin General: Color- Normal Color. Moisture- Normal Moisture.  Neck Carotid Arteries- Normal color. Moisture- Normal Moisture. No carotid bruits. No JVD.  Chest and Lung Exam Auscultation: Breath Sounds:-Normal.  Cardiovascular Auscultation:Rythm- Regular. Murmurs & Other Heart Sounds:Auscultation of the heart reveals- No Murmurs.  Abdomen Inspection:-Inspeection Normal. Palpation/Percussion:Note:No mass. Palpation and Percussion of the  abdomen reveal- Non Tender, Non Distended + BS, no rebound or guarding.    Neurologic Cranial Nerve exam:- CN III-XII intact(No nystagmus), symmetric smile. Strength:- 5/5 equal and symmetric strength both upper and lower extremities.      Assessment & Plan:  Your blood pressure is elevated today but you are not on your medication. Refilled your bp med today. I wrote you a prescription for bp cuff. See if insurance will pay for monitor. If not still get and check bp daily over next week. Confirm bp less than 140/90.  Continue protonix for gerd.  Follow up with neurologist for myelitis.  Follow up date to be determined after lab review.  Also on follow up will begin to update you health maintenance.

## 2016-06-29 NOTE — Progress Notes (Signed)
Pre visit review using our clinic review tool, if applicable. No additional management support is needed unless otherwise documented below in the visit note/SLS  

## 2016-06-30 ENCOUNTER — Other Ambulatory Visit: Payer: Self-pay | Admitting: Medical

## 2016-06-30 MED ORDER — POTASSIUM CHLORIDE ER 10 MEQ PO TBCR: 10.0000 meq | EXTENDED_RELEASE_TABLET | Freq: Every day | ORAL | 0 refills | Status: DC

## 2016-06-30 MED ORDER — ATORVASTATIN CALCIUM 10 MG PO TABS
10.0000 mg | ORAL_TABLET | Freq: Every day | ORAL | 3 refills | Status: DC
Start: 2016-06-30 — End: 2016-10-19

## 2016-06-30 NOTE — Telephone Encounter (Signed)
Called patient and spoke to her daughter Debbe Odea.  Made her aware that Rx for k-dur and atorvastatin have been sent to the pharmacy.  She stated understanding and was appreciative for the call.  No additional needs voiced at this time.    PCP changed to [facility], PA-C in EMR.

## 2016-06-30 NOTE — Telephone Encounter (Signed)
rx k-dur and atorvastatin sent to pharmacy

## 2016-07-19 ENCOUNTER — Encounter: Payer: Self-pay | Admitting: *Deleted

## 2016-07-19 NOTE — Progress Notes (Signed)
Letter Mailed

## 2016-10-19 ENCOUNTER — Other Ambulatory Visit: Payer: Self-pay | Admitting: Medical

## 2016-10-20 NOTE — Telephone Encounter (Signed)
Pt due for follow up please call and schedule appointment.  

## 2016-10-21 NOTE — Telephone Encounter (Signed)
lvm advising patient of message below °

## 2016-11-16 ENCOUNTER — Other Ambulatory Visit: Payer: Self-pay | Admitting: Medical

## 2016-11-24 ENCOUNTER — Encounter: Payer: Self-pay | Admitting: Medical

## 2016-11-24 ENCOUNTER — Telehealth: Payer: Self-pay | Admitting: Medical

## 2016-11-24 ENCOUNTER — Ambulatory Visit (INDEPENDENT_AMBULATORY_CARE_PROVIDER_SITE_OTHER): Payer: Medicare Other | Admitting: Medical

## 2016-11-24 VITALS — BP 127/68 | HR 84 | Temp 99.0°F | Resp 16 | Wt 138.4 lb

## 2016-11-24 DIAGNOSIS — I10 Essential (primary) hypertension: Secondary | ICD-10-CM | POA: Diagnosis not present

## 2016-11-24 DIAGNOSIS — Z1239 Encounter for other screening for malignant neoplasm of breast: Secondary | ICD-10-CM

## 2016-11-24 DIAGNOSIS — Z1231 Encounter for screening mammogram for malignant neoplasm of breast: Secondary | ICD-10-CM

## 2016-11-24 DIAGNOSIS — E785 Hyperlipidemia, unspecified: Secondary | ICD-10-CM | POA: Diagnosis not present

## 2016-11-24 DIAGNOSIS — R739 Hyperglycemia, unspecified: Secondary | ICD-10-CM

## 2016-11-24 DIAGNOSIS — Z1211 Encounter for screening for malignant neoplasm of colon: Secondary | ICD-10-CM

## 2016-11-24 DIAGNOSIS — Z23 Encounter for immunization: Secondary | ICD-10-CM | POA: Diagnosis not present

## 2016-11-24 LAB — COMPREHENSIVE METABOLIC PANEL
ALBUMIN: 4.3 g/dL (ref 3.5–5.2)
ALT: 14 U/L (ref 0–35)
AST: 16 U/L (ref 0–37)
Alkaline Phosphatase: 117 U/L (ref 39–117)
BUN: 13 mg/dL (ref 6–23)
CHLORIDE: 95 meq/L — AB (ref 96–112)
CO2: 27 mEq/L (ref 19–32)
CREATININE: 0.56 mg/dL (ref 0.40–1.20)
Calcium: 9.9 mg/dL (ref 8.4–10.5)
GFR: 140.26 mL/min (ref >60.00)
Glucose, Bld: 130 mg/dL — ABNORMAL HIGH (ref 70–99)
Potassium: 3.6 mEq/L (ref 3.5–5.1)
SODIUM: 132 meq/L — AB (ref 135–145)
Total Bilirubin: 0.2 mg/dL (ref 0.2–1.2)
Total Protein: 7.7 g/dL (ref 6.0–8.3)

## 2016-11-24 LAB — LIPID PANEL
CHOL/HDL RATIO: 3
CHOLESTEROL: 198 mg/dL (ref 0–200)
HDL: 66 mg/dL (ref >39.00)
LDL CALC: 117 mg/dL — AB (ref 0–99)
NONHDL: 131.56
Triglycerides: 72 mg/dL (ref 0.0–149.0)
VLDL: 14.4 mg/dL (ref 0.0–40.0)

## 2016-11-24 MED ORDER — ATORVASTATIN CALCIUM 10 MG PO TABS: ORAL_TABLET | ORAL | 1 refills | Status: DC

## 2016-11-24 MED ORDER — LISINOPRIL-HYDROCHLOROTHIAZIDE 10-12.5 MG PO TABS: 1.0000 | ORAL_TABLET | Freq: Every day | ORAL | 1 refills | Status: DC

## 2016-11-24 NOTE — Patient Instructions (Signed)
Your blood pressure is well controlled and refilling bp meds.  For high cholesterol repeat lipid panel with cmp today. Then decide if dose adjustment on lipid med needed.  Placed mammogram order today. You can schedule today or call back and speak with radiology dept.  Gi referral made for screening colonoscopy.  tdap given today.  Follow up 3-6 months. 3 months if labs abnormal. Also 3 month would be time for flu vaccine.

## 2016-11-24 NOTE — Progress Notes (Signed)
Subjective:    Patient ID: Taylor Wood, female    DOB: 26-Jan-1951, 66 y.o.   MRN: [id number]  HPI  Pt in for follow up.   Pt bp medication working per pt. Today good BP level. On lisinopril/hctz. No cardiac or neurologic signs or symptoms.  Pt k mild low in past. No muscle cramps.  Pt lipid panel moderate high last check 4 months ago. Pt has been on atorvastatin.  Pt never had colonosocopy. No fh of colon cancer.   Pt ok with tdap today.  Also due for mammogram.  Daughter thinks she won't get studies. Pt currently pt non commited.     Review of Systems  Constitutional: Negative for chills, fatigue and fever.  HENT: Negative for congestion, sinus pain and sinus pressure.   Respiratory: Negative for cough, chest tightness, shortness of breath and wheezing.   Cardiovascular: Negative for chest pain and palpitations.  Gastrointestinal: Negative for abdominal pain, constipation, nausea and vomiting.  Musculoskeletal: Negative for back pain.  Skin: Negative for rash.  Neurological: Negative for dizziness and headaches.  Hematological: Negative for adenopathy.  Psychiatric/Behavioral: Negative for behavioral problems, confusion and sleep disturbance. The patient is not nervous/anxious.      Past Medical History:  Diagnosis Date  . HA (headache)   . Hypertension   . Seizure (HCC)   . Viral encephalitis   . Viral meningitis      Social History   Social History  . Marital status: Single    Spouse name: N/A  . Number of children: N/A  . Years of education: N/A   Occupational History  . Not on file.   Social History Main Topics  . Smoking status: Never Smoker  . Smokeless tobacco: Never Used  . Alcohol use No  . Drug use: No  . Sexual activity: No   Other Topics Concern  . Not on file   Social History Narrative  . No narrative on file    Past Surgical History:  Procedure Laterality Date  . ABDOMINAL HYSTERECTOMY      Family History  Problem Relation  Age of Onset  . Hypertension Mother   . Heart disease Mother   . Diabetes Mother   . Hypertension Father   . Alzheimer's disease Father   . Diabetes Maternal Grandmother   . Hypertension Maternal Aunt   . Diabetes Maternal Aunt   . Hypertension Sister   . Healthy Brother        x3  . Healthy Daughter        x4  . Seizures Daughter   . Healthy Son        x1    No Known Allergies  Current Outpatient Prescriptions on File Prior to Visit  Medication Sig Dispense Refill  . atorvastatin (LIPITOR) 10 MG tablet TAKE 1 TABLET(10 MG) BY MOUTH DAILY 30 tablet 0  . baclofen (LIORESAL) 10 MG tablet Take 10 mg by mouth 3 (three) times daily.    . carbamazepine (TEGRETOL) 100 MG chewable tablet Chew 300 mg by mouth 2 (two) times daily.    . Cholecalciferol (VITAMIN D-3) 1000 UNITS CAPS Take by mouth daily.    Marland Kitchen lisinopril-hydrochlorothiazide (PRINZIDE,ZESTORETIC) 10-12.5 MG tablet TAKE 1 TABLET BY MOUTH DAILY 30 tablet 0  . Multiple Minerals-Vitamins (CALCIUM & VIT D3 BONE HEALTH PO) Take by mouth daily.    . naproxen (NAPROSYN) 500 MG tablet Take 1 tablet (500 mg total) by mouth 2 (two) times daily as needed (for shoulder pain).  20 tablet 0  . pantoprazole (PROTONIX) 40 MG tablet TAKE 1 TABLET BY MOUTH EVERY DAY AT 6:00AM 90 tablet 1  . potassium chloride (K-DUR) 10 MEQ tablet TAKE 1 TABLET(10 MEQ) BY MOUTH DAILY 90 tablet 0   No current facility-administered medications on file prior to visit.     BP 127/68   Pulse 84   Temp 99 F (37.2 C) (Oral)   Resp 16   Wt 138 lb 6.4 oz (62.8 kg)   SpO2 99%   BMI 26.15 kg/m       Objective:   Physical Exam  General Mental Status- Alert. General Appearance- Not in acute distress.   Skin General: Color- Normal Color. Moisture- Normal Moisture.  Neck Carotid Arteries- Normal color. Moisture- Normal Moisture. No carotid bruits. No JVD.  Chest and Lung Exam Auscultation: Breath Sounds:-Normal.  Cardiovascular Auscultation:Rythm-  Regular. Murmurs & Other Heart Sounds:Auscultation of the heart reveals- No Murmurs.  Abdomen Inspection:-Inspeection Normal. Palpation/Percussion:Note:No mass. Palpation and Percussion of the abdomen reveal- Non Tender, Non Distended + BS, no rebound or guarding.    Neurologic Cranial Nerve exam:- CN III-XII intact(No nystagmus), symmetric smile. Strength:- 5/5 equal and symmetric strength both upper and lower extremities.      Assessment & Plan:  Your blood pressure is well controlled and refilling bp meds.  For high cholesterol repeat lipid panel with cmp today. Then decide if dose adjustment on lipid med needed.  Placed mammogram order today. You can schedule today or call back and speak with radiology dept.  Gi referral made for screening colonoscopy.  tdap given today.  Follow up 3-6 months. 3 months if labs abnormal. Also 3 month would be time for flu vaccine.  Betzaira Mentel, Ramon Dredge, PA-C

## 2016-11-24 NOTE — Telephone Encounter (Signed)
Future a1c placed. 

## 2016-12-08 DIAGNOSIS — D72810 Lymphocytopenia: Secondary | ICD-10-CM | POA: Diagnosis not present

## 2016-12-08 DIAGNOSIS — G360 Neuromyelitis optica [Devic]: Secondary | ICD-10-CM | POA: Diagnosis not present

## 2016-12-08 DIAGNOSIS — Z79899 Other long term (current) drug therapy: Secondary | ICD-10-CM | POA: Diagnosis not present

## 2016-12-08 DIAGNOSIS — Z8669 Personal history of other diseases of the nervous system and sense organs: Secondary | ICD-10-CM | POA: Diagnosis not present

## 2016-12-15 DIAGNOSIS — G360 Neuromyelitis optica [Devic]: Secondary | ICD-10-CM | POA: Diagnosis not present

## 2016-12-26 ENCOUNTER — Encounter: Payer: Self-pay | Admitting: Medical

## 2016-12-27 ENCOUNTER — Telehealth: Payer: Self-pay | Admitting: Medical

## 2016-12-27 NOTE — Telephone Encounter (Signed)
Would you call patient and see what happened to her gastrenterologist visit. Apparently she did not make that appointment and GI notify me.  I would still like her to see them and eventually get a colonoscopy. The me know what she says please.

## 2016-12-28 NOTE — Telephone Encounter (Signed)
Patient declined to schedule.

## 2016-12-29 DIAGNOSIS — G360 Neuromyelitis optica [Devic]: Secondary | ICD-10-CM | POA: Diagnosis not present

## 2017-05-18 ENCOUNTER — Other Ambulatory Visit: Payer: Self-pay | Admitting: Medical

## 2017-05-18 DIAGNOSIS — I10 Essential (primary) hypertension: Secondary | ICD-10-CM

## 2017-05-19 NOTE — Telephone Encounter (Signed)
Called and scheduled appt for 05/25/17 @10am 

## 2017-05-19 NOTE — Telephone Encounter (Signed)
Pt is due for follow up please call and schedule appointment.  

## 2017-05-25 ENCOUNTER — Ambulatory Visit (INDEPENDENT_AMBULATORY_CARE_PROVIDER_SITE_OTHER): Payer: Medicare Other | Admitting: Medical

## 2017-05-25 ENCOUNTER — Encounter: Payer: Self-pay | Admitting: Medical

## 2017-05-25 VITALS — BP 137/62 | HR 89 | Temp 98.3°F | Resp 16 | Ht 61.0 in | Wt 144.0 lb

## 2017-05-25 DIAGNOSIS — R739 Hyperglycemia, unspecified: Secondary | ICD-10-CM

## 2017-05-25 DIAGNOSIS — E785 Hyperlipidemia, unspecified: Secondary | ICD-10-CM | POA: Diagnosis not present

## 2017-05-25 DIAGNOSIS — I10 Essential (primary) hypertension: Secondary | ICD-10-CM

## 2017-05-25 MED ORDER — LISINOPRIL-HYDROCHLOROTHIAZIDE 10-12.5 MG PO TABS: 1.0000 | ORAL_TABLET | Freq: Every day | ORAL | 1 refills | Status: DC

## 2017-05-25 NOTE — Patient Instructions (Signed)
Your blood pressure is well controlled today.  I did prescribe you refills of your lisinopril/HCTZ.  For your high cholesterol, I placed order for CMP and lipid panel.  For history of mild high sugars on recent labs, I am placing a A1c order to assess 67-month blood sugar average.  I recommend some daily exercise and start to cut back on sugar in your diet.  We will let you know how strict you need to be based on that A1c average.  Please reconsider some of the maintenance items/order I placed.  If you change your mind and want mammogram please let me know.  Also if you change your mind on colonoscopy I will replace that referral.  Follow-up in 3 months or as needed.

## 2017-05-25 NOTE — Progress Notes (Signed)
Subjective:    Patient ID: Taylor Wood, female    DOB: March 13, 1951, 67 y.o.   MRN: [id number]  HPI  Pt in for follow up.   She has htn. Has not been checking bp at home. No cardiac or neruologic signs or symptoms.   Pt never got mammogram study done. But I did place.  She declines today again.  Pt declines flu vaccine.(educated today on being seen early in event of illness).  Referral place for colonoscopy. She did not go.  Pt has been following up with her neurologist regular basis.  Pt is fasting today.  In past mild sugar elevation on last labs. Has cut back on sugar in diet but no exercise.    Review of Systems  Constitutional: Negative for chills, fatigue and fever.  Respiratory: Negative for cough, chest tightness, shortness of breath and wheezing.   Cardiovascular: Negative for chest pain and palpitations.  Gastrointestinal: Negative for abdominal pain, blood in stool, diarrhea, nausea and rectal pain.  Musculoskeletal: Negative for back pain, joint swelling, myalgias and neck stiffness.  Skin: Negative for rash.  Neurological: Negative for dizziness, syncope, weakness and headaches.  Hematological: Negative for adenopathy. Does not bruise/bleed easily.  Psychiatric/Behavioral: Negative for behavioral problems and confusion.       Denies depression. Pt did not fill out questioneer.   Past Medical History:  Diagnosis Date  . HA (headache)   . Hypertension   . Seizure (HCC)   . Viral encephalitis   . Viral meningitis      Social History   Socioeconomic History  . Marital status: Single    Spouse name: Not on file  . Number of children: Not on file  . Years of education: Not on file  . Highest education level: Not on file  Social Needs  . Financial resource strain: Not on file  . Food insecurity - worry: Not on file  . Food insecurity - inability: Not on file  . Transportation needs - medical: Not on file  . Transportation needs - non-medical: Not on file    Occupational History  . Not on file  Tobacco Use  . Smoking status: Never Smoker  . Smokeless tobacco: Never Used  Substance and Sexual Activity  . Alcohol use: No    Alcohol/week: 0.0 oz  . Drug use: No  . Sexual activity: No  Other Topics Concern  . Not on file  Social History Narrative  . Not on file    Past Surgical History:  Procedure Laterality Date  . ABDOMINAL HYSTERECTOMY      Family History  Problem Relation Age of Onset  . Hypertension Mother   . Heart disease Mother   . Diabetes Mother   . Hypertension Father   . Alzheimer's disease Father   . Diabetes Maternal Grandmother   . Hypertension Maternal Aunt   . Diabetes Maternal Aunt   . Hypertension Sister   . Healthy Brother        x3  . Healthy Daughter        x4  . Seizures Daughter   . Healthy Son        x1    No Known Allergies  Current Outpatient Medications on File Prior to Visit  Medication Sig Dispense Refill  . baclofen (LIORESAL) 10 MG tablet Take 10 mg by mouth 3 (three) times daily.    . carbamazepine (TEGRETOL) 100 MG chewable tablet Chew 300 mg by mouth 2 (two) times daily.    Marland Kitchen  Cholecalciferol (VITAMIN D-3) 1000 UNITS CAPS Take by mouth daily.    . Multiple Minerals-Vitamins (CALCIUM & VIT D3 BONE HEALTH PO) Take by mouth daily.    . naproxen (NAPROSYN) 500 MG tablet Take 1 tablet (500 mg total) by mouth 2 (two) times daily as needed (for shoulder pain). 20 tablet 0  . atorvastatin (LIPITOR) 10 MG tablet TAKE 1 TABLET(10 MG) BY MOUTH DAILY (Patient not taking: Reported on 05/25/2017) 90 tablet 1  . pantoprazole (PROTONIX) 40 MG tablet TAKE 1 TABLET BY MOUTH EVERY DAY AT 6:00AM (Patient not taking: Reported on 05/25/2017) 90 tablet 1  . potassium chloride (K-DUR) 10 MEQ tablet TAKE 1 TABLET(10 MEQ) BY MOUTH DAILY (Patient not taking: Reported on 05/25/2017) 90 tablet 0   No current facility-administered medications on file prior to visit.     BP 137/62 (BP Location: Left Arm, Patient  Position: Sitting, Cuff Size: Normal)   Pulse 89   Temp 98.3 F (36.8 C) (Oral)   Resp 16   Ht 5\' 1"  (1.549 m)   Wt 144 lb (65.3 kg)   SpO2 98%   BMI 27.21 kg/m       Objective:   Physical Exam   General Mental Status- Alert. General Appearance- Not in acute distress.   Skin General: Color- Normal Color. Moisture- Normal Moisture.  Neck Carotid Arteries- Normal color. Moisture- Normal Moisture. No carotid bruits. No JVD.  Chest and Lung Exam Auscultation: Breath Sounds:-Normal.  Cardiovascular Auscultation:Rythm- Regular. Murmurs & Other Heart Sounds:Auscultation of the heart reveals- No Murmurs.  Abdomen Inspection:-Inspeection Normal. Palpation/Percussion:Note:No mass. Palpation and Percussion of the abdomen reveal- Non Tender, Non Distended + BS, no rebound or guarding.   Neurologic Cranial Nerve exam:- CN III-XII intact(No nystagmus), symmetric smile. Strength:- 5/5 equal and symmetric strength both upper and lower extremities.     Assessment & Plan:  Your blood pressure is well controlled today.  I did prescribe you refills of your lisinopril/HCTZ.  For your high cholesterol, I placed order for CMP and lipid panel.  For history of mild high sugars on recent labs, I am placing a A1c order to assess 62-month blood sugar average.  I recommend some daily exercise and start to cut back on sugar in your diet.  We will let you know how strict you need to be based on that A1c average.  Please reconsider some of the maintenance items/order I placed.  If you change your mind and want mammogram please let me know.  Also if you change your mind on colonoscopy I will replace that referral.  Follow-up in 3 months or as needed.

## 2017-05-25 NOTE — Addendum Note (Signed)
Addended by: Harley Alto on: 05/25/2017 06:15 PM   Modules accepted: Orders

## 2017-06-01 ENCOUNTER — Other Ambulatory Visit (INDEPENDENT_AMBULATORY_CARE_PROVIDER_SITE_OTHER): Payer: Medicare Other

## 2017-06-01 DIAGNOSIS — I10 Essential (primary) hypertension: Secondary | ICD-10-CM | POA: Diagnosis not present

## 2017-06-01 DIAGNOSIS — R739 Hyperglycemia, unspecified: Secondary | ICD-10-CM | POA: Diagnosis not present

## 2017-06-01 DIAGNOSIS — E785 Hyperlipidemia, unspecified: Secondary | ICD-10-CM

## 2017-06-01 LAB — LIPID PANEL
Cholesterol: 273 mg/dL — ABNORMAL HIGH (ref 0–200)
HDL: 65.6 mg/dL (ref >39.00)
LDL CALC: 184 mg/dL — AB (ref 0–99)
NONHDL: 207.52
Total CHOL/HDL Ratio: 4
Triglycerides: 118 mg/dL (ref 0.0–149.0)
VLDL: 23.6 mg/dL (ref 0.0–40.0)

## 2017-06-01 LAB — COMPREHENSIVE METABOLIC PANEL
ALT: 18 U/L (ref 0–35)
AST: 20 U/L (ref 0–37)
Albumin: 4.4 g/dL (ref 3.5–5.2)
Alkaline Phosphatase: 133 U/L — ABNORMAL HIGH (ref 39–117)
BUN: 13 mg/dL (ref 6–23)
CO2: 31 meq/L (ref 19–32)
Calcium: 9.6 mg/dL (ref 8.4–10.5)
Chloride: 94 mEq/L — ABNORMAL LOW (ref 96–112)
Creatinine, Ser: 0.53 mg/dL (ref 0.40–1.20)
GFR: 149.22 mL/min (ref >60.00)
GLUCOSE: 111 mg/dL — AB (ref 70–99)
POTASSIUM: 3.9 meq/L (ref 3.5–5.1)
Sodium: 135 mEq/L (ref 135–145)
Total Bilirubin: 0.3 mg/dL (ref 0.2–1.2)
Total Protein: 7.8 g/dL (ref 6.0–8.3)

## 2017-06-01 LAB — HEMOGLOBIN A1C: HEMOGLOBIN A1C: 6.4 % (ref 4.6–6.5)

## 2017-06-09 ENCOUNTER — Telehealth: Payer: Self-pay | Admitting: Medical

## 2017-06-09 NOTE — Telephone Encounter (Signed)
Pt  Called  And  Notified  About rx    Transfer    To  [facility]  She  Was  Notified  To give  Them  A  Little  Time  To get  It   Transferred   And  If  Any problems  To give  Them a  Call   Or call us  Back

## 2017-06-09 NOTE — Telephone Encounter (Signed)
Copied from CRM [id number]. Topic: Quick Communication - See Telephone Encounter >> Jun 09, 2017  9:06 AM Cipriano Bunker wrote: CRM for notification. See Telephone encounter for:   Pt. Called about her Lipitor medication (high cholesterol ) it looks like sent to [facility].  It should go to [id number] - [place], [place] - [address] AT [facility] [address] [place] [id number] Phone: [phone] Fax: [phone]   Please resend  06/09/17.

## 2017-06-09 NOTE — Telephone Encounter (Signed)
Spoke   With   Pharmacist   Greg  At  [facility]   On  [address]    Pt has  A  rx  On  File   For  lipitor  Pt request it  Be  Sent to the  [facility]   s  [facility]   The  [facility]  Spoke  To  Chad  The  Pharmacist  She  Will call   [facility]  And  Have  The  lipitor  Transferred

## 2017-06-13 DIAGNOSIS — R252 Cramp and spasm: Secondary | ICD-10-CM | POA: Diagnosis not present

## 2017-06-13 DIAGNOSIS — Z79899 Other long term (current) drug therapy: Secondary | ICD-10-CM | POA: Diagnosis not present

## 2017-06-13 DIAGNOSIS — G360 Neuromyelitis optica [Devic]: Secondary | ICD-10-CM | POA: Diagnosis not present

## 2017-06-15 DIAGNOSIS — G360 Neuromyelitis optica [Devic]: Secondary | ICD-10-CM | POA: Diagnosis not present

## 2017-06-29 DIAGNOSIS — G360 Neuromyelitis optica [Devic]: Secondary | ICD-10-CM | POA: Diagnosis not present

## 2017-08-15 ENCOUNTER — Other Ambulatory Visit: Payer: Self-pay | Admitting: Medical

## 2017-08-15 DIAGNOSIS — I10 Essential (primary) hypertension: Secondary | ICD-10-CM

## 2017-09-02 ENCOUNTER — Emergency Department (HOSPITAL_BASED_OUTPATIENT_CLINIC_OR_DEPARTMENT_OTHER): Payer: Medicare Other

## 2017-09-02 ENCOUNTER — Other Ambulatory Visit: Payer: Self-pay

## 2017-09-02 ENCOUNTER — Encounter (HOSPITAL_BASED_OUTPATIENT_CLINIC_OR_DEPARTMENT_OTHER): Payer: Self-pay | Admitting: Emergency Medicine

## 2017-09-02 ENCOUNTER — Emergency Department (HOSPITAL_BASED_OUTPATIENT_CLINIC_OR_DEPARTMENT_OTHER)
Admission: EM | Admit: 2017-09-02 | Discharge: 2017-09-02 | Disposition: A | Discharge status: Other Acute Inpatient Hospital | Payer: Medicare Other | Attending: Emergency Medicine | Admitting: Emergency Medicine

## 2017-09-02 DIAGNOSIS — R29898 Other symptoms and signs involving the musculoskeletal system: Secondary | ICD-10-CM | POA: Diagnosis not present

## 2017-09-02 DIAGNOSIS — Z79899 Other long term (current) drug therapy: Secondary | ICD-10-CM | POA: Diagnosis not present

## 2017-09-02 DIAGNOSIS — I10 Essential (primary) hypertension: Secondary | ICD-10-CM | POA: Insufficient documentation

## 2017-09-02 DIAGNOSIS — R531 Weakness: Secondary | ICD-10-CM | POA: Diagnosis not present

## 2017-09-02 DIAGNOSIS — G360 Neuromyelitis optica [Devic]: Secondary | ICD-10-CM | POA: Insufficient documentation

## 2017-09-02 DIAGNOSIS — Z8661 Personal history of infections of the central nervous system: Secondary | ICD-10-CM | POA: Diagnosis not present

## 2017-09-02 DIAGNOSIS — G959 Disease of spinal cord, unspecified: Secondary | ICD-10-CM | POA: Diagnosis not present

## 2017-09-02 LAB — URINALYSIS, ROUTINE W REFLEX MICROSCOPIC
BILIRUBIN URINE: NEGATIVE
GLUCOSE, UA: NEGATIVE mg/dL
KETONES UR: NEGATIVE mg/dL
Leukocytes, UA: NEGATIVE
Nitrite: NEGATIVE
PH: 6 (ref 5.0–8.0)
PROTEIN: NEGATIVE mg/dL
Specific Gravity, Urine: 1.015 (ref 1.005–1.030)

## 2017-09-02 LAB — BASIC METABOLIC PANEL
ANION GAP: 14 (ref 5–15)
BUN: 8 mg/dL (ref 6–20)
CO2: 25 mmol/L (ref 22–32)
Calcium: 9.3 mg/dL (ref 8.9–10.3)
Chloride: 95 mmol/L — ABNORMAL LOW (ref 101–111)
Creatinine, Ser: 0.5 mg/dL (ref 0.44–1.00)
GFR calc Af Amer: >60 mL/min (ref >60)
GFR calc non Af Amer: >60 mL/min (ref >60)
GLUCOSE: 126 mg/dL — AB (ref 65–99)
POTASSIUM: 3.1 mmol/L — AB (ref 3.5–5.1)
Sodium: 134 mmol/L — ABNORMAL LOW (ref 135–145)

## 2017-09-02 LAB — CBC WITH DIFFERENTIAL/PLATELET
Basophils Absolute: 0 10*3/uL (ref 0.0–0.1)
Basophils Relative: 0 %
EOS PCT: 0 %
Eosinophils Absolute: 0 10*3/uL (ref 0.0–0.7)
HEMATOCRIT: 35.6 % — AB (ref 36.0–46.0)
Hemoglobin: 12.3 g/dL (ref 12.0–15.0)
LYMPHS ABS: 1.5 10*3/uL (ref 0.7–4.0)
LYMPHS PCT: 29 %
MCH: 31.5 pg (ref 26.0–34.0)
MCHC: 34.6 g/dL (ref 30.0–36.0)
MCV: 91 fL (ref 78.0–100.0)
MONO ABS: 0.8 10*3/uL (ref 0.1–1.0)
MONOS PCT: 15 %
NEUTROS ABS: 2.9 10*3/uL (ref 1.7–7.7)
Neutrophils Relative %: 56 %
PLATELETS: 358 10*3/uL (ref 150–400)
RBC: 3.91 MIL/uL (ref 3.87–5.11)
RDW: 13.4 % (ref 11.5–15.5)
WBC: 5.2 10*3/uL (ref 4.0–10.5)

## 2017-09-02 LAB — URINALYSIS, MICROSCOPIC (REFLEX)

## 2017-09-02 MED ORDER — GADOBENATE DIMEGLUMINE 529 MG/ML IV SOLN
10.0000 mL | Freq: Once | INTRAVENOUS | Status: AC | PRN
  Administered 2017-09-02: 10 mL via INTRAVENOUS

## 2017-09-02 MED ORDER — SODIUM CHLORIDE 0.9 % IV BOLUS
500.0000 mL | Freq: Once | INTRAVENOUS | Status: AC
  Administered 2017-09-02: 500 mL via INTRAVENOUS

## 2017-09-02 NOTE — ED Triage Notes (Signed)
Patient states that she is having weakness to her left leg since Thursday  - the patient states that she gets infusions for "leg problems"  - last was Feb. The patient states that this feels different

## 2017-09-02 NOTE — ED Notes (Signed)
Pt has not yet returned from MRI

## 2017-09-02 NOTE — ED Notes (Signed)
Bed assignment [address] [place] room 563 received from [facility] for [facility]. Call report to [phone], charge nurse if needed [phone]. They will send transport

## 2017-09-02 NOTE — ED Notes (Signed)
Pt states she is hungry. VORB from [place], [place] that pt may eat. Offered frozen meal and snacks available in ED. Pt declined. Family at bedside

## 2017-09-02 NOTE — ED Notes (Signed)
Pt in MRI.

## 2017-09-02 NOTE — ED Notes (Signed)
Pt in BR with family

## 2017-09-02 NOTE — ED Notes (Signed)
Pt taken to BR via wheelchair prior to transfer to [facility]. [facility] at bedside to assume care.

## 2017-09-02 NOTE — ED Notes (Signed)
PA notified that pt does not wish to be admitted at [facility]. States she will talk to pt.

## 2017-09-02 NOTE — ED Notes (Signed)
ED Provider at bedside. 

## 2017-09-02 NOTE — ED Notes (Signed)
PA at bedside to discuss admission options with pt. Pt states she will go to [facility].

## 2017-09-02 NOTE — ED Notes (Signed)
Pt gave verbal consent to transfer to [facility]. States unable to sign but made her mark in box. Witnessed by Eliseo Gum, RN and Lequita Halt, EMT.

## 2017-09-02 NOTE — ED Notes (Signed)
Hot pack on right AC for IV start

## 2017-09-02 NOTE — ED Notes (Signed)
Pt returned from MRI at this time

## 2017-09-02 NOTE — ED Provider Notes (Addendum)
[facility] EMERGENCY DEPARTMENT Provider Note   CSN: [id number] Arrival date & time: 09/02/17  1253     History   Chief Complaint Chief Complaint  Patient presents with  . Extremity Weakness    HPI Taylor Wood is a 67 y.o. female with a past medical history of hypertension, who presents to ED for evaluation of right lower extremity weakness for the past 4 days.  She states that she was walking 4 days ago when she felt like "my leg was going to give out."  Since then she has had similar feelings when ambulating.  Patient states that she gets rituximab infusions every 6 months for her neuromyelitis optica at neurologist office.  Her most recent infusion was in February 2019.  She is not taking any additional medications to help with the symptoms.  Denies any injuries or falls, numbness in legs, joint pains, lower back pain, loss of bowel or bladder function, history of DVT or PE.  HPI  Past Medical History:  Diagnosis Date  . HA (headache)   . Hypertension   . Seizure (HCC)   . Viral encephalitis   . Viral meningitis     Patient Active Problem List   Diagnosis Date Noted  . Visit for preventive health examination 08/25/2015  . Breast cancer screening 02/17/2015  . Essential hypertension, benign 03/17/2014  . Transverse myelitis (HCC) 02/23/2014  . Right arm weakness 02/05/2014  . Dental caries 02/21/2013  . Viral encephalitis 01/24/2013  . Seizures (HCC) 01/24/2013    Past Surgical History:  Procedure Laterality Date  . ABDOMINAL HYSTERECTOMY       OB History   None      Home Medications    Prior to Admission medications   Medication Sig Start Date End Date Taking? Authorizing Provider  atorvastatin (LIPITOR) 10 MG tablet TAKE 1 TABLET(10 MG) BY MOUTH DAILY Patient not taking: Reported on 05/25/2017 11/24/16   Saguier, Ramon Dredge, PA-C  baclofen (LIORESAL) 10 MG tablet Take 10 mg by mouth 3 (three) times daily.    [provider]  carbamazepine  (TEGRETOL) 100 MG chewable tablet Chew 300 mg by mouth 2 (two) times daily.    [provider]  Cholecalciferol (VITAMIN D-3) 1000 UNITS CAPS Take by mouth daily.    [provider]  lisinopril-hydrochlorothiazide (PRINZIDE,ZESTORETIC) 10-12.5 MG tablet Take 1 tablet by mouth daily. 05/25/17   Saguier, Ramon Dredge, PA-C  lisinopril-hydrochlorothiazide (PRINZIDE,ZESTORETIC) 10-12.5 MG tablet TAKE 1 TABLET BY MOUTH DAILY 08/17/17   Saguier, Ramon Dredge, PA-C  Multiple Minerals-Vitamins (CALCIUM & VIT D3 BONE HEALTH PO) Take by mouth daily.    [provider]  naproxen (NAPROSYN) 500 MG tablet Take 1 tablet (500 mg total) by mouth 2 (two) times daily as needed (for shoulder pain). 07/14/15   Molpus, John, MD  pantoprazole (PROTONIX) 40 MG tablet TAKE 1 TABLET BY MOUTH EVERY DAY AT 6:00AM Patient not taking: Reported on 05/25/2017 06/29/16   Saguier, Ramon Dredge, PA-C  potassium chloride (K-DUR) 10 MEQ tablet TAKE 1 TABLET(10 MEQ) BY MOUTH DAILY Patient not taking: Reported on 05/25/2017 06/30/16   Saguier, Ramon Dredge, PA-C    Family History Family History  Problem Relation Age of Onset  . Hypertension Mother   . Heart disease Mother   . Diabetes Mother   . Hypertension Father   . Alzheimer's disease Father   . Diabetes Maternal Grandmother   . Hypertension Maternal Aunt   . Diabetes Maternal Aunt   . Hypertension Sister   . Healthy Brother  x3  . Healthy Daughter        x4  . Seizures Daughter   . Healthy Son        x1    Social History Social History   Tobacco Use  . Smoking status: Never Smoker  . Smokeless tobacco: Never Used  Substance Use Topics  . Alcohol use: No    Alcohol/week: 0.0 oz  . Drug use: No     Allergies   Patient has no known allergies.   Review of Systems Review of Systems  Constitutional: Negative for appetite change, chills and fever.  HENT: Negative for ear pain, rhinorrhea, sneezing and sore throat.   Eyes: Negative for photophobia and  visual disturbance.  Respiratory: Negative for cough, chest tightness, shortness of breath and wheezing.   Cardiovascular: Negative for chest pain and palpitations.  Gastrointestinal: Negative for abdominal pain, blood in stool, constipation, diarrhea, nausea and vomiting.  Genitourinary: Negative for dysuria, hematuria and urgency.  Musculoskeletal: Negative for myalgias.  Skin: Negative for rash.  Neurological: Positive for weakness. Negative for dizziness and light-headedness.     Physical Exam Updated Vital Signs BP (!) 144/68   Pulse 74   Temp 99.3 F (37.4 C) (Oral)   Resp 19   Ht 5' (1.524 m)   Wt 61 kg (134 lb 6.4 oz)   SpO2 98%   BMI 26.25 kg/m   Physical Exam  Constitutional: She appears well-developed and well-nourished. No distress.  HENT:  Head: Normocephalic and atraumatic.  Nose: Nose normal.  Eyes: Conjunctivae and EOM are normal. Left eye exhibits no discharge. No scleral icterus.  Neck: Normal range of motion. Neck supple.  Cardiovascular: Normal rate, regular rhythm, normal heart sounds and intact distal pulses. Exam reveals no gallop and no friction rub.  No murmur heard. Pulmonary/Chest: Effort normal and breath sounds normal. No respiratory distress.  Abdominal: Soft. Bowel sounds are normal. She exhibits no distension. There is no tenderness. There is no guarding.  Musculoskeletal: Normal range of motion. She exhibits no edema.  No midline spinal tenderness present in lumbar, thoracic or cervical spine. No step-off palpated. No visible bruising, edema or temperature change noted. No objective signs of numbness present. No saddle anesthesia. 2+ DP pulses bilaterally. Sensation intact to light touch. Strength 5/5 in bilateral lower extremities. Able to perform full passive and active ROM at R hip, knee and ankle.  Neurological: She is alert. She exhibits normal muscle tone. Coordination normal.  Skin: Skin is warm and dry. No rash noted.  Psychiatric: She  has a normal mood and affect.  Nursing note and vitals reviewed.    ED Treatments / Results  Labs (all labs ordered are listed, but only abnormal results are displayed) Labs Reviewed  BASIC METABOLIC PANEL - Abnormal; Notable for the following components:      Result Value   Sodium 134 (*)    Potassium 3.1 (*)    Chloride 95 (*)    Glucose, Bld 126 (*)    All other components within normal limits  CBC WITH DIFFERENTIAL/PLATELET - Abnormal; Notable for the following components:   HCT 35.6 (*)    All other components within normal limits  URINALYSIS, ROUTINE W REFLEX MICROSCOPIC - Abnormal; Notable for the following components:   Hgb urine dipstick TRACE (*)    All other components within normal limits  URINALYSIS, MICROSCOPIC (REFLEX) - Abnormal; Notable for the following components:   Bacteria, UA MANY (*)    All other components within normal  limits    EKG None  Radiology Mr Laqueta Jean And Wo Contrast  Result Date: 09/02/2017 CLINICAL DATA:  Right lower extremity weakness. Patient does not feel like herself since Thursday. History of transverse myelitis EXAM: MRI HEAD WITHOUT AND WITH CONTRAST TECHNIQUE: Multiplanar, multiecho pulse sequences of the brain and surrounding structures were obtained without and with intravenous contrast. CONTRAST:  10mL MULTIHANCE GADOBENATE DIMEGLUMINE 529 MG/ML IV SOLN COMPARISON:  02/04/2014 FINDINGS: Brain: No acute infarction, hemorrhage, hydrocephalus, extra-axial collection or mass lesion. Minimal periventricular FLAIR hyperintensity. Few FLAIR hyperintense foci in and about the bilateral basal ganglia. No progression or typical demyelinating pattern. Vascular: Major flow voids are preserved Skull and upper cervical spine: Cervical spine reported separately. No new or aggressive marrow lesion. Sinuses/Orbits: Negative IMPRESSION: 1. No acute finding. 2. Few supratentorial signal abnormalities. No specific demyelinating pattern or change from 2015.  Electronically Signed   By: Marnee Spring M.D.   On: 09/02/2017 16:49   Mr Cervical Spine W Or Wo Contrast  Result Date: 09/02/2017 CLINICAL DATA:  Right-sided weakness since Thursday. Patient does not feel like herself. History of transverse myelitis/neuromyelitis optica spectrum EXAM: MRI CERVICAL SPINE WITHOUT AND WITH CONTRAST TECHNIQUE: Multiplanar and multiecho pulse sequences of the cervical spine, to include the craniocervical junction and cervicothoracic junction, were obtained without and with intravenous contrast. CONTRAST:  10mL MULTIHANCE GADOBENATE DIMEGLUMINE 529 MG/ML IV SOLN COMPARISON:  02/05/2014 FINDINGS: Alignment: Straightening without subluxation. Vertebrae: No fracture, evidence of discitis, or bone lesion. Cord: T2 hyperintense appearance of the right eccentric cervical cord extending from C2 to C5/6. Expansion seen on prior has resolved, in fact there is volume loss in the right cord as it extends inferiorly. Even so, there is extensive enhancement within the abnormal signal right cord. Posterior Fossa, vertebral arteries, paraspinal tissues: 16 mm right thyroid nodule that is at least partially cystic, similar to prior Disc levels: Disc degeneration with bulging and central protrusions at C3-4 to C6-7. Cervical facet spurring. No degenerative cord impingement. Mild to moderate multilevel foraminal narrowing most notable on the left at C3-4, right C4-5, and right at C5-6. IMPRESSION: History of neuromyelitis optica spectrum disorder with similar extent of signal abnormality in the right cord (C2 to C5-6). There is resolved mass effect and lower cervical cord thinning since 2015, but extensive enhancement within the affected cord suggests ongoing or recurrent inflammation. Electronically Signed   By: Marnee Spring M.D.   On: 09/02/2017 16:59    Procedures Procedures (including critical care time)  Medications Ordered in ED Medications  sodium chloride 0.9 % bolus 500 mL (0  mLs Intravenous Stopped 09/02/17 1810)  gadobenate dimeglumine (MULTIHANCE) injection 10 mL (10 mLs Intravenous Contrast Given 09/02/17 1632)     Initial Impression / Assessment and Plan / ED Course  I have reviewed the triage vital signs and the nursing notes.  Pertinent labs & imaging results that were available during my care of the patient were reviewed by me and considered in my medical decision making (see chart for details).  Clinical Course as of Sep 02 2000  Sat Sep 02, 2017  1724 Spoke to Dr. Renne Crigler from [facility] neurology.  She recommends transfer to [facility] directly to neurology bed for consultation, evaluation and admission.  She states that this is concerning that patient is having recurrent inflammation despite the use of rituximab.  Patient informed of this and is agreeable to plan.   [HK]  1916 Patient refuses admission at [facility] because she states that this is  too far for her family to visit her.  I spoke to Dr. Otelia Limes from [facility] who recommends patient be transferred and taken care of at [facility] that they are familiar with her care.   [HK]  1918 Spoke to patient again.  Told her that she could either leave AGAINST MEDICAL ADVICE or be transferred to [facility].  She agreed to be transferred to [facility].  Will again consult admitting team there.   [HK]  1932 [facility] states that the bed that was previously available for the patient is no longer available.  They state that the earliest they may have a bed available as tomorrow at noon.  Will reconsult [facility] neurology with this information.   [HK]  2001 [facility] states they have bed available. Will now initiate transfer of patient. Vital signs remain stable.   [HK]    Clinical Course User Index [HK] Dietrich Pates, PA-C    Patient presents ED for evaluation of 4-day history of right lower extremity weakness.  States that she feels like her leg is going to give out on her.  She has a history of neuromyelitis optica for which she  received rituximab infusions every 6 months.  Her most recent infusion was February 2019.  She denies any loss of sensation, loss of bowel or bladder function, injuries or falls, back pain, vision changes.  On physical exam there are no objective signs of weakness or numbness present.  Lab work including CBC, BMP unremarkable.  Urinalysis unremarkable.  MRI of the brain was unremarkable.  MRI of the cervical spine is concerning for recurrent or ongoing inflammation.  I spoke to Dr. Renne Crigler of [facility] neurology, since patient is seen by neurology there, who recommends transfer, evaluation admission for concern of ongoing inflammation despite her to rituximab infusions.  Patient was initially reluctant for admission however, agreed after stressing the importance.  Appreciate the help of neurology for management of this patient. Patient will be sent with copy of MRI. Patient discussed with and seen by my attending, Dr. Silverio Lay.  Portions of this note were generated with Scientist, clinical (histocompatibility and immunogenetics). Dictation errors may occur despite best attempts at proofreading.   Final Clinical Impressions(s) / ED Diagnoses   Final diagnoses:  None    ED Discharge Orders    None         Dietrich Pates, PA-C 09/02/17 2002    Long, Joshua G, MD 09/02/17 2249

## 2017-09-02 NOTE — ED Notes (Signed)
Pt and family given room assignment. Transfer process explained.

## 2017-09-04 DIAGNOSIS — R479 Unspecified speech disturbances: Secondary | ICD-10-CM | POA: Diagnosis not present

## 2017-09-04 DIAGNOSIS — H539 Unspecified visual disturbance: Secondary | ICD-10-CM | POA: Diagnosis not present

## 2017-09-04 DIAGNOSIS — R079 Chest pain, unspecified: Secondary | ICD-10-CM | POA: Diagnosis not present

## 2017-09-04 DIAGNOSIS — R269 Unspecified abnormalities of gait and mobility: Secondary | ICD-10-CM | POA: Diagnosis not present

## 2017-09-04 MED ORDER — BACLOFEN 10 MG PO TABS
10.00 | ORAL_TABLET | ORAL | Status: DC
Start: 2017-09-03 — End: 2017-09-04

## 2017-09-04 MED ORDER — ENOXAPARIN SODIUM 40 MG/0.4ML ~~LOC~~ SOLN
40.00 | SUBCUTANEOUS | Status: DC
Start: 2017-09-04 — End: 2017-09-04

## 2017-09-04 MED ORDER — CARBAMAZEPINE ER 300 MG PO CP12
300.00 | ORAL_CAPSULE | ORAL | Status: DC
Start: 2017-09-03 — End: 2017-09-04

## 2017-09-05 DIAGNOSIS — G360 Neuromyelitis optica [Devic]: Secondary | ICD-10-CM | POA: Diagnosis not present

## 2017-09-05 DIAGNOSIS — Z79899 Other long term (current) drug therapy: Secondary | ICD-10-CM | POA: Diagnosis not present

## 2017-09-05 DIAGNOSIS — Z5189 Encounter for other specified aftercare: Secondary | ICD-10-CM | POA: Diagnosis not present

## 2017-11-14 ENCOUNTER — Other Ambulatory Visit: Payer: Self-pay | Admitting: Medical

## 2017-11-14 DIAGNOSIS — I10 Essential (primary) hypertension: Secondary | ICD-10-CM

## 2017-11-14 NOTE — Telephone Encounter (Signed)
Pt due for follow up please call and schedule appointment.  

## 2017-11-14 NOTE — Telephone Encounter (Signed)
LVM for pt to schedule a fu appt with provider.

## 2017-11-25 ENCOUNTER — Other Ambulatory Visit: Payer: Self-pay | Admitting: Medical

## 2017-11-27 NOTE — Telephone Encounter (Signed)
Pt due for follow up please call and schedule appointment.  

## 2017-11-28 NOTE — Telephone Encounter (Signed)
LVM for pt to call and schedule fu appt with provider.

## 2017-12-14 DIAGNOSIS — Z79899 Other long term (current) drug therapy: Secondary | ICD-10-CM | POA: Diagnosis not present

## 2017-12-14 DIAGNOSIS — G360 Neuromyelitis optica [Devic]: Secondary | ICD-10-CM | POA: Diagnosis not present

## 2017-12-29 DIAGNOSIS — G360 Neuromyelitis optica [Devic]: Secondary | ICD-10-CM | POA: Diagnosis not present

## 2018-01-30 DIAGNOSIS — G360 Neuromyelitis optica [Devic]: Secondary | ICD-10-CM | POA: Diagnosis not present

## 2018-01-30 DIAGNOSIS — G0491 Myelitis, unspecified: Secondary | ICD-10-CM | POA: Diagnosis not present

## 2018-01-30 DIAGNOSIS — Z79899 Other long term (current) drug therapy: Secondary | ICD-10-CM | POA: Diagnosis not present

## 2018-02-24 ENCOUNTER — Other Ambulatory Visit: Payer: Self-pay | Admitting: Medical

## 2018-02-27 ENCOUNTER — Other Ambulatory Visit: Payer: Self-pay | Admitting: Medical

## 2018-02-27 NOTE — Telephone Encounter (Addendum)
Pt due for follow up please call and schedule follow up.  

## 2018-03-01 NOTE — Telephone Encounter (Signed)
Spoke with daughter Debbe Odea on [place]) and informed the below. Daughter will call to schedule pt's appt after verified with her schedule.

## 2018-04-06 ENCOUNTER — Other Ambulatory Visit: Payer: Self-pay | Admitting: Medical

## 2018-05-11 ENCOUNTER — Other Ambulatory Visit: Payer: Self-pay | Admitting: Medical

## 2018-05-11 DIAGNOSIS — I10 Essential (primary) hypertension: Secondary | ICD-10-CM

## 2018-05-14 ENCOUNTER — Other Ambulatory Visit: Payer: Self-pay | Admitting: Medical

## 2018-05-24 ENCOUNTER — Other Ambulatory Visit: Payer: Self-pay

## 2018-05-24 MED ORDER — ATORVASTATIN CALCIUM 10 MG PO TABS: 10.0000 mg | ORAL_TABLET | Freq: Every day | ORAL | 0 refills | Status: DC

## 2018-05-24 NOTE — Progress Notes (Unsigned)
Pt is due for follow up. Please call an schedule appointment.

## 2018-05-25 NOTE — Progress Notes (Signed)
Called Pt left msg on her daughter voicemail to let her know to call us for an appointment.

## 2018-06-22 ENCOUNTER — Other Ambulatory Visit: Payer: Self-pay | Admitting: Medical

## 2018-06-22 NOTE — Telephone Encounter (Signed)
LVM for pt to schedule fu appt with provider.  °

## 2018-06-22 NOTE — Telephone Encounter (Signed)
Pt due for follow up please call and schedule appointment.  

## 2018-07-05 DIAGNOSIS — Z79899 Other long term (current) drug therapy: Secondary | ICD-10-CM | POA: Diagnosis not present

## 2018-07-05 DIAGNOSIS — G360 Neuromyelitis optica [Devic]: Secondary | ICD-10-CM | POA: Diagnosis not present

## 2018-07-26 DIAGNOSIS — G360 Neuromyelitis optica [Devic]: Secondary | ICD-10-CM | POA: Diagnosis not present

## 2018-07-26 DIAGNOSIS — Z79899 Other long term (current) drug therapy: Secondary | ICD-10-CM | POA: Diagnosis not present

## 2018-09-26 DIAGNOSIS — G360 Neuromyelitis optica [Devic]: Secondary | ICD-10-CM | POA: Diagnosis not present

## 2018-12-10 ENCOUNTER — Other Ambulatory Visit: Payer: Self-pay

## 2019-02-26 DIAGNOSIS — Z79899 Other long term (current) drug therapy: Secondary | ICD-10-CM | POA: Diagnosis not present

## 2019-02-26 DIAGNOSIS — G360 Neuromyelitis optica [Devic]: Secondary | ICD-10-CM | POA: Diagnosis not present

## 2019-03-12 DIAGNOSIS — G360 Neuromyelitis optica [Devic]: Secondary | ICD-10-CM | POA: Diagnosis not present

## 2019-08-01 ENCOUNTER — Ambulatory Visit: Payer: Medicare Other | Attending: Internal Medicine

## 2019-08-01 DIAGNOSIS — Z23 Encounter for immunization: Secondary | ICD-10-CM

## 2019-08-01 NOTE — Progress Notes (Signed)
   Covid-19 Vaccination Clinic  Name:  Taylor Wood    MRN: [id number] DOB: January 14, 1951  08/01/2019  [name] was observed post Covid-19 immunization for 15 minutes without incident. She was provided with Vaccine Information Sheet and instruction to access the V-Safe system.   [name] was instructed to call 911 with any severe reactions post vaccine: Marland Kitchen Difficulty breathing  . Swelling of face and throat  . A fast heartbeat  . A bad rash all over body  . Dizziness and weakness   Immunizations Administered    Name Date Dose VIS Date Route   Pfizer COVID-19 Vaccine 08/01/2019 11:35 AM 0.3 mL 04/19/2019 Intramuscular   Manufacturer: [employer], [employer]   Lot: [id number]   NDC: [address]-5

## 2019-08-26 ENCOUNTER — Ambulatory Visit: Payer: Medicare Other

## 2019-08-26 ENCOUNTER — Telehealth: Payer: Self-pay | Admitting: *Deleted

## 2019-08-26 NOTE — Telephone Encounter (Signed)
Rescheduled 2nd Pfizer vaccine due to exposure today to a positive Covid person. Rescheduled for 09/10/19 10:00am at the [facility]. Provided s/sx to look for and if appeared to wait 10 days with no symptoms to get the 2nd vaccine.

## 2019-08-27 ENCOUNTER — Ambulatory Visit: Payer: Medicare Other

## 2019-09-10 ENCOUNTER — Ambulatory Visit: Payer: Medicare Other

## 2019-09-17 DIAGNOSIS — Z79899 Other long term (current) drug therapy: Secondary | ICD-10-CM | POA: Diagnosis not present

## 2019-09-17 DIAGNOSIS — G360 Neuromyelitis optica [Devic]: Secondary | ICD-10-CM | POA: Diagnosis not present

## 2019-09-18 ENCOUNTER — Ambulatory Visit: Payer: Medicare Other | Attending: Internal Medicine

## 2019-09-18 DIAGNOSIS — Z23 Encounter for immunization: Secondary | ICD-10-CM

## 2019-09-18 NOTE — Progress Notes (Signed)
   Covid-19 Vaccination Clinic  Name:  Taylor Wood    MRN: [id number] DOB: 1950/06/07  09/18/2019  Taylor Wood was observed post Covid-19 immunization for 15 minutes without incident. She was provided with Vaccine Information Sheet and instruction to access the V-Safe system.   Taylor Wood was instructed to call 911 with any severe reactions post vaccine: Marland Kitchen Difficulty breathing  . Swelling of face and throat  . A fast heartbeat  . A bad rash all over body  . Dizziness and weakness   Immunizations Administered    Name Date Dose VIS Date Route   Pfizer COVID-19 Vaccine 09/18/2019 10:53 AM 0.3 mL 07/03/2018 Intramuscular   Manufacturer: [employer], [employer]   Lot: [id number]   NDC: [address]-0

## 2019-10-01 DIAGNOSIS — G360 Neuromyelitis optica [Devic]: Secondary | ICD-10-CM | POA: Diagnosis not present

## 2019-10-01 DIAGNOSIS — Z79899 Other long term (current) drug therapy: Secondary | ICD-10-CM | POA: Diagnosis not present

## 2020-12-07 ENCOUNTER — Encounter: Payer: Self-pay | Admitting: Medical

## 2020-12-07 ENCOUNTER — Other Ambulatory Visit: Payer: Self-pay | Admitting: Medical

## 2020-12-07 ENCOUNTER — Other Ambulatory Visit: Payer: Self-pay

## 2020-12-07 ENCOUNTER — Ambulatory Visit (INDEPENDENT_AMBULATORY_CARE_PROVIDER_SITE_OTHER): Payer: Medicare Other | Admitting: Medical

## 2020-12-07 VITALS — BP 160/85 | HR 91 | Resp 18 | Ht 61.0 in | Wt 119.4 lb

## 2020-12-07 DIAGNOSIS — I10 Essential (primary) hypertension: Secondary | ICD-10-CM

## 2020-12-07 DIAGNOSIS — R739 Hyperglycemia, unspecified: Secondary | ICD-10-CM | POA: Diagnosis not present

## 2020-12-07 DIAGNOSIS — R569 Unspecified convulsions: Secondary | ICD-10-CM

## 2020-12-07 DIAGNOSIS — G373 Acute transverse myelitis in demyelinating disease of central nervous system: Secondary | ICD-10-CM | POA: Diagnosis not present

## 2020-12-07 DIAGNOSIS — E782 Mixed hyperlipidemia: Secondary | ICD-10-CM | POA: Diagnosis not present

## 2020-12-07 MED ORDER — LISINOPRIL-HYDROCHLOROTHIAZIDE 10-12.5 MG PO TABS: 1.0000 | ORAL_TABLET | Freq: Every day | ORAL | 1 refills | Status: DC

## 2020-12-07 NOTE — Patient Instructions (Addendum)
Not seen in the office for 3 years or more.  Describe interruption due to COVID/pandemic.  Recently been off hypertensive medication for 2 months.  Blood pressure is moderate  high but no cardiac or neurologic signs or symptoms.  Refill lisinopril 10/12.5mg  dose today.  Take medication daily and in 2 weeks we will follow-up to see if dose adjustment needed.  History of high cholesterol and will get metabolic panel and lipid panel today.  If levels elevated will place you back on statin.  History of elevated sugar on lab review.  Will include A1c today and labs.  History of optic neuromyelitis and transverse myelitis per chart review.  You have been followed by neurologist but now need referral to get back in with them.  Referral was placed and designated as urgent.  Follow-up in 2 weeks for BP check or sooner if needed.

## 2020-12-07 NOTE — Progress Notes (Signed)
Subjective:    Patient ID: Taylor Wood, female    DOB: [birth year], 70 y.o.   MRN: [id number]  HPI  Pt in to reestablish.  Pt has not been seen for some time. She had been seeing neurologist but then lost to follow up. She needs new referral for neuromyelitis.  States covid had kept her out of the office.   Pt has hx of htn, gerd, hyperlipidemia and low cit D.  Pt bp is high today. No cardiac or neurologic signs or symptoms.  Pt is fasting.  Pt has been out of bp, cholesterol and stomach meds for months.  Pt tells me that she had no reflux/gerd for months.  On review some mild elevated sugar level in past.    Review of Systems  Constitutional:  Negative for chills, fatigue and fever.  HENT:  Negative for dental problem.   Respiratory:  Negative for cough, chest tightness, shortness of breath and wheezing.   Cardiovascular:  Negative for chest pain and palpitations.  Gastrointestinal:  Negative for abdominal distention and anal bleeding.  Genitourinary:  Negative for dysuria.  Musculoskeletal:  Negative for arthralgias and back pain.  Neurological:  Negative for dizziness, speech difficulty, weakness, numbness and headaches.  Hematological:  Negative for adenopathy. Does not bruise/bleed easily.  Psychiatric/Behavioral:  Negative for behavioral problems and confusion.     Past Medical History:  Diagnosis Date   HA (headache)    Hypertension    Seizure (HCC)    Viral encephalitis    Viral meningitis      Social History   Socioeconomic History   Marital status: Single    Spouse name: Not on file   Number of children: Not on file   Years of education: Not on file   Highest education level: Not on file  Occupational History   Not on file  Tobacco Use   Smoking status: Never   Smokeless tobacco: Never  Substance and Sexual Activity   Alcohol use: No    Alcohol/week: 0.0 standard drinks   Drug use: No   Sexual activity: Never  Other Topics Concern   Not on  file  Social History Narrative   Not on file   Social Determinants of Health   Financial Resource Strain: Not on file  Food Insecurity: Not on file  Transportation Needs: Not on file  Physical Activity: Not on file  Stress: Not on file  Social Connections: Not on file  Intimate Partner Violence: Not on file    Past Surgical History:  Procedure Laterality Date   ABDOMINAL HYSTERECTOMY      Family History  Problem Relation Age of Onset   Hypertension Mother    Heart disease Mother    Diabetes Mother    Hypertension Father    Alzheimer's disease Father    Diabetes Maternal Grandmother    Hypertension Maternal Aunt    Diabetes Maternal Aunt    Hypertension Sister    Healthy Brother        x3   Healthy Daughter        x4   Seizures Daughter    Healthy Son        x1    No Known Allergies  Current Outpatient Medications on File Prior to Visit  Medication Sig Dispense Refill   atorvastatin (LIPITOR) 10 MG tablet Take 1 tablet (10 mg total) by mouth daily. 30 tablet 0   baclofen (LIORESAL) 10 MG tablet Take 10 mg by mouth 3 (three) times  daily.     carbamazepine (TEGRETOL) 100 MG chewable tablet Chew 300 mg by mouth 2 (two) times daily.     Cholecalciferol (VITAMIN D-3) 1000 UNITS CAPS Take by mouth daily.     lisinopril-hydrochlorothiazide (PRINZIDE,ZESTORETIC) 10-12.5 MG tablet Take 1 tablet by mouth daily. 90 tablet 1   lisinopril-hydrochlorothiazide (PRINZIDE,ZESTORETIC) 10-12.5 MG tablet TAKE 1 TABLET BY MOUTH DAILY 90 tablet 0   Multiple Minerals-Vitamins (CALCIUM & VIT D3 BONE HEALTH PO) Take by mouth daily.     naproxen (NAPROSYN) 500 MG tablet Take 1 tablet (500 mg total) by mouth 2 (two) times daily as needed (for shoulder pain). 20 tablet 0   pantoprazole (PROTONIX) 40 MG tablet TAKE 1 TABLET BY MOUTH EVERY DAY AT 6:00AM 90 tablet 1   potassium chloride (K-DUR) 10 MEQ tablet TAKE 1 TABLET(10 MEQ) BY MOUTH DAILY 90 tablet 0   No current facility-administered  medications on file prior to visit.    BP (!) 157/68   Pulse 91   Resp 18   Ht 5\' 1"  (1.549 m)   Wt 119 lb 6.4 oz (54.2 kg)   SpO2 97%   BMI 22.56 kg/m       Objective:   Physical Exam   General Mental Status- Alert. General Appearance- Not in acute distress.   Skin General: Color- Normal Color. Moisture- Normal Moisture.  Neck Carotid Arteries- Normal color. Moisture- Normal Moisture. No carotid bruits. No JVD.  Chest and Lung Exam Auscultation: Breath Sounds:-Normal.  Cardiovascular Auscultation:Rythm- Regular. Murmurs & Other Heart Sounds:Auscultation of the heart reveals- No Murmurs.  Abdomen Inspection:-Inspeection Normal. Palpation/Percussion:Note:No mass. Palpation and Percussion of the abdomen reveal- Non Tender, Non Distended + BS, no rebound or guarding.    Neurologic Cranial Nerve exam:- CN III-XII intact(No nystagmus), symmetric smile. Drift Test:- No drift. Finger to Nose:- Normal/Intact Strength:- 5/5 equal and symmetric strength both upper and lower extremities.      Assessment & Plan:   Not seen in the office for 3 years or more.  Describe interruption due to COVID/pandemic.  Recently been off hypertensive medication for 2 months.  Blood pressure is moderate  high but no cardiac or neurologic signs or symptoms.  Refill lisinopril 10/12.5mg  dose today.  Take medication daily and in 2 weeks we will follow-up to see if dose adjustment needed.  History of high cholesterol and will get metabolic panel and lipid panel today.  If levels elevated will place you back on statin.  History of elevated sugar on lab review.  Will include A1c today and labs.  History of optic neuromyelitis and transverse myelitis per chart review.  You have been followed by neurologist but now need referral to get back in with them.  Referral was placed and designated as urgent.  Follow-up in 2 weeks for BP check or sooner if needed.  , PA-C

## 2020-12-08 ENCOUNTER — Telehealth: Payer: Self-pay | Admitting: Medical

## 2020-12-08 LAB — CBC WITH DIFFERENTIAL/PLATELET
Basophils Absolute: 0 10*3/uL (ref 0.0–0.1)
Basophils Relative: 1.1 % (ref 0.0–3.0)
Eosinophils Absolute: 0 10*3/uL (ref 0.0–0.7)
Eosinophils Relative: 0.4 % (ref 0.0–5.0)
HCT: 37 % (ref 36.0–46.0)
Hemoglobin: 12.4 g/dL (ref 12.0–15.0)
Lymphocytes Relative: 36.5 % (ref 12.0–46.0)
Lymphs Abs: 1.5 10*3/uL (ref 0.7–4.0)
MCHC: 33.5 g/dL (ref 30.0–36.0)
MCV: 93.6 fl (ref 78.0–100.0)
Monocytes Absolute: 0.4 10*3/uL (ref 0.1–1.0)
Monocytes Relative: 9.8 % (ref 3.0–12.0)
Neutro Abs: 2.1 10*3/uL (ref 1.4–7.7)
Neutrophils Relative %: 52.2 % (ref 43.0–77.0)
Platelets: 333 10*3/uL (ref 150.0–400.0)
RBC: 3.96 Mil/uL (ref 3.87–5.11)
RDW: 13.7 % (ref 11.5–15.5)
WBC: 4.1 10*3/uL (ref 4.0–10.5)

## 2020-12-08 LAB — COMPREHENSIVE METABOLIC PANEL
ALT: 15 U/L (ref 0–35)
AST: 15 U/L (ref 0–37)
Albumin: 4.4 g/dL (ref 3.5–5.2)
Alkaline Phosphatase: 100 U/L (ref 39–117)
BUN: 9 mg/dL (ref 6–23)
CO2: 29 mEq/L (ref 19–32)
Calcium: 10 mg/dL (ref 8.4–10.5)
Chloride: 101 mEq/L (ref 96–112)
Creatinine, Ser: 0.49 mg/dL (ref 0.40–1.20)
GFR: 97.25 mL/min (ref >60.00)
Glucose, Bld: 121 mg/dL — ABNORMAL HIGH (ref 70–99)
Potassium: 4.2 mEq/L (ref 3.5–5.1)
Sodium: 140 mEq/L (ref 135–145)
Total Bilirubin: 0.3 mg/dL (ref 0.2–1.2)
Total Protein: 7.5 g/dL (ref 6.0–8.3)

## 2020-12-08 LAB — LIPID PANEL
Cholesterol: 292 mg/dL — ABNORMAL HIGH (ref 0–200)
HDL: 59.7 mg/dL (ref >39.00)
LDL Cholesterol: 209 mg/dL — ABNORMAL HIGH (ref 0–99)
NonHDL: 232.15
Total CHOL/HDL Ratio: 5
Triglycerides: 115 mg/dL (ref 0.0–149.0)
VLDL: 23 mg/dL (ref 0.0–40.0)

## 2020-12-08 LAB — HEMOGLOBIN A1C: Hgb A1c MFr Bld: 6.1 % (ref 4.6–6.5)

## 2020-12-08 MED ORDER — ATORVASTATIN CALCIUM 10 MG PO TABS: 10.0000 mg | ORAL_TABLET | Freq: Every day | ORAL | 3 refills | Status: DC

## 2020-12-08 NOTE — Telephone Encounter (Signed)
Rx lipitor sent to pt pharmacy. 

## 2020-12-29 ENCOUNTER — Encounter: Payer: Self-pay | Admitting: Medical

## 2020-12-29 ENCOUNTER — Other Ambulatory Visit: Payer: Self-pay

## 2020-12-29 ENCOUNTER — Ambulatory Visit (INDEPENDENT_AMBULATORY_CARE_PROVIDER_SITE_OTHER): Payer: Medicare Other

## 2020-12-29 ENCOUNTER — Ambulatory Visit (INDEPENDENT_AMBULATORY_CARE_PROVIDER_SITE_OTHER): Payer: Medicare Other | Admitting: Medical

## 2020-12-29 VITALS — BP 130/80 | HR 97 | Temp 98.0°F | Resp 16 | Ht 61.0 in | Wt 115.0 lb

## 2020-12-29 VITALS — BP 150/84 | HR 100 | Temp 98.0°F | Resp 16 | Ht 61.0 in | Wt 115.0 lb

## 2020-12-29 DIAGNOSIS — I10 Essential (primary) hypertension: Secondary | ICD-10-CM | POA: Diagnosis not present

## 2020-12-29 DIAGNOSIS — R739 Hyperglycemia, unspecified: Secondary | ICD-10-CM

## 2020-12-29 DIAGNOSIS — Z0000 Encounter for general adult medical examination without abnormal findings: Secondary | ICD-10-CM | POA: Diagnosis not present

## 2020-12-29 DIAGNOSIS — G373 Acute transverse myelitis in demyelinating disease of central nervous system: Secondary | ICD-10-CM | POA: Diagnosis not present

## 2020-12-29 DIAGNOSIS — E782 Mixed hyperlipidemia: Secondary | ICD-10-CM | POA: Diagnosis not present

## 2020-12-29 MED ORDER — PANTOPRAZOLE SODIUM 40 MG PO TBEC: DELAYED_RELEASE_TABLET | ORAL | 1 refills | Status: DC

## 2020-12-29 NOTE — Patient Instructions (Addendum)
Your bp is better today on 3rd recheck bp 130/80. Continue current zestoretic 10/12.5 daily. Do recommend get bp cuff over the counter and check periodically to confirm less than 140/90.  For high cholesterol rx statin sent to pharmacy on recent lab review..  For elevated sugar average recently recommend low sugar diet. A1c in prediabetic range though in past a1c was 6.8. If sugar average worsen on rechecks would recommend diabetic meds.   For gerd rx protonix.  For transverse myelitis follow up with neurologist.  Follow up in 3 months or sooner if needed

## 2020-12-29 NOTE — Progress Notes (Addendum)
Subjective:   Taylor Wood is a 70 y.o. female who presents for an Initial Medicare Annual Wellness Visit.  Review of Systems     Cardiac Risk Factors include: hypertension;sedentary lifestyle     Objective:    Today's Vitals   12/29/20 1348  BP: (!) 150/84  Pulse: 100  Resp: 16  Temp: 98 F (36.7 C)  TempSrc: Temporal  SpO2: 97%  Weight: 115 lb (52.2 kg)  Height: 5\' 1"  (1.549 m)   Body mass index is 21.73 kg/m.  Advanced Directives 12/29/2020 09/02/2017 07/13/2015 06/07/2014 03/23/2014 02/04/2014 01/25/2014  Does Patient Have a Medical Advance Directive? No No No No No No No  Would patient like information on creating a medical advance directive? No - Patient declined No - Patient declined No - patient declined information No - patient declined information No - patient declined information - -    Current Medications (verified) Outpatient Encounter Medications as of 12/29/2020  Medication Sig   atorvastatin (LIPITOR) 10 MG tablet Take 1 tablet (10 mg total) by mouth daily.   baclofen (LIORESAL) 10 MG tablet Take 10 mg by mouth 3 (three) times daily.   carbamazepine (TEGRETOL) 100 MG chewable tablet Chew 300 mg by mouth 2 (two) times daily.   Cholecalciferol (VITAMIN D-3) 1000 UNITS CAPS Take by mouth daily.   lisinopril-hydrochlorothiazide (PRINZIDE,ZESTORETIC) 10-12.5 MG tablet TAKE 1 TABLET BY MOUTH DAILY   lisinopril-hydrochlorothiazide (ZESTORETIC) 10-12.5 MG tablet TAKE 1 TABLET BY MOUTH DAILY   Multiple Minerals-Vitamins (CALCIUM & VIT D3 BONE HEALTH PO) Take by mouth daily.   naproxen (NAPROSYN) 500 MG tablet Take 1 tablet (500 mg total) by mouth 2 (two) times daily as needed (for shoulder pain).   pantoprazole (PROTONIX) 40 MG tablet TAKE 1 TABLET BY MOUTH EVERY DAY AT 6:00AM   potassium chloride (K-DUR) 10 MEQ tablet TAKE 1 TABLET(10 MEQ) BY MOUTH DAILY   No facility-administered encounter medications on file as of 12/29/2020.    Allergies (verified) Patient has no  known allergies.   History: Past Medical History:  Diagnosis Date   HA (headache)    Hypertension    Seizure (HCC)    Viral encephalitis    Viral meningitis    Past Surgical History:  Procedure Laterality Date   ABDOMINAL HYSTERECTOMY     Family History  Problem Relation Age of Onset   Hypertension Mother    Heart disease Mother    Diabetes Mother    Hypertension Father    Alzheimer's disease Father    Diabetes Maternal Grandmother    Hypertension Maternal Aunt    Diabetes Maternal Aunt    Hypertension Sister    12/31/2020        x3   Healthy Daughter        x4   Seizures Daughter    Healthy Son        x1   Social History   Socioeconomic History   Marital status: Legally Separated    Spouse name: Not on file   Number of children: Not on file   Years of education: Not on file   Highest education level: Not on file  Occupational History   Not on file  Tobacco Use   Smoking status: Never   Smokeless tobacco: Never  Substance and Sexual Activity   Alcohol use: No    Alcohol/week: 0.0 standard drinks   Drug use: No   Sexual activity: Never  Other Topics Concern   Not on file  Social History  Narrative   Not on file   Social Determinants of Health   Financial Resource Strain: Low Risk    Difficulty of Paying Living Expenses: Not hard at all  Food Insecurity: No Food Insecurity   Worried About Programme researcher, broadcasting/film/video in the Last Year: Never true   Ran Out of Food in the Last Year: Never true  Transportation Needs: No Transportation Needs   Lack of Transportation (Medical): No   Lack of Transportation (Non-Medical): No  Physical Activity: Inactive   Days of Exercise per Week: 0 days   Minutes of Exercise per Session: 0 min  Stress: No Stress Concern Present   Feeling of Stress : Not at all  Social Connections: Socially Isolated   Frequency of Communication with Friends and Family: More than three times a week   Frequency of Social Gatherings with  Friends and Family: More than three times a week   Attends Religious Services: Never   Database administrator or Organizations: No   Attends Engineer, structural: Never   Marital Status: Separated    Tobacco Counseling Counseling given: Not Answered   Clinical Intake:  Pre-visit preparation completed: Yes  Pain : No/denies pain     Nutritional Status: BMI of 19-24  Normal Nutritional Risks: Unintentional weight loss Diabetes: No  How often do you need to have someone help you when you read instructions, pamphlets, or other written materials from your doctor or pharmacy?: 1 - Never  Diabetic?No     Information entered by :: Thomasenia Sales LPN   Activities of Daily Living In your present state of health, do you have any difficulty performing the following activities: 12/29/2020  Hearing? N  Vision? N  Difficulty concentrating or making decisions? N  Walking or climbing stairs? N  Dressing or bathing? N  Doing errands, shopping? N  Preparing Food and eating ? N  Using the Toilet? N  In the past six months, have you accidently leaked urine? N  Do you have problems with loss of bowel control? N  Managing your Medications? N  Managing your Finances? N  Housekeeping or managing your Housekeeping? N  Some recent data might be hidden    Patient Care Team: Saguier, Kateri Mc as PCP - General (Internal Medicine)  Indicate any recent Medical Services you may have received from other than [facility] providers in the past year (date may be approximate).     Assessment:   This is a routine wellness examination for Taylor Wood.  Hearing/Vision screen Hearing Screening - Comments:: No issues Vision Screening - Comments:: Wears glasses Last eye exam-unknown  Dietary issues and exercise activities discussed: Current Exercise Habits: The patient does not participate in regular exercise at present   Goals Addressed             This Visit's Progress    Patient  Stated       None       Depression Screen PHQ 2/9 Scores 12/29/2020 12/07/2020 08/25/2015 02/21/2013  PHQ - 2 Score 0 0 0 0  Exception Documentation - - Patient refusal -    Fall Risk Fall Risk  12/29/2020 12/07/2020 12/10/2018 08/25/2015 02/21/2013  Falls in the past year? 0 0 0 No No  Comment - - Emmi Telephone Survey: data to providers prior to load - -  Number falls in past yr: 0 0 - - -  Injury with Fall? 0 0 - - -  Follow up Falls prevention discussed - - - -  FALL RISK PREVENTION PERTAINING TO THE HOME:  Any stairs in or around the home? Yes  If so, are there any without handrails? No  Home free of loose throw rugs in walkways, pet beds, electrical cords, etc? Yes  Adequate lighting in your home to reduce risk of falls? Yes   ASSISTIVE DEVICES UTILIZED TO PREVENT FALLS:  Life alert? No  Use of a cane, walker or w/c? No  Grab bars in the bathroom? No  Shower chair or bench in shower? Yes  Elevated toilet seat or a handicapped toilet? No   TIMED UP AND GO:  Was the test performed? No . Using Wheelchair   Cognitive Function:Normal cognitive status assessed by direct observation by this Nurse Health Advisor. No abnormalities found.          Immunizations Immunization History  Administered Date(s) Administered   PFIZER(Purple Top)SARS-COV-2 Vaccination 08/01/2019, 09/18/2019   Tdap 11/24/2016    TDAP status: Up to date  Flu vaccine status: Due 01/2021  Pneumococcal vaccine status: Declined,  Education has been provided regarding the importance of this vaccine but patient still declined. Advised may receive this vaccine at local pharmacy or Health Dept. Aware to provide a copy of the vaccination record if obtained from local pharmacy or Health Dept. Verbalized acceptance and understanding.   Covid-19 vaccine status: Information provided on how to obtain vaccines. Booster due  Qualifies for Shingles Vaccine? Yes   Zostavax completed No   Shingrix Completed?: No.     Education has been provided regarding the importance of this vaccine. Patient has been advised to call insurance company to determine out of pocket expense if they have not yet received this vaccine. Advised may also receive vaccine at local pharmacy or Health Dept. Verbalized acceptance and understanding.  Screening Tests Health Maintenance  Topic Date Due   Hepatitis C Screening  Never done   Zoster Vaccines- Shingrix (1 of 2) Never done   COLONOSCOPY (Pts 45-73yrs Insurance coverage will need to be confirmed)  Never done   MAMMOGRAM  Never done   DEXA SCAN  Never done   PNA vac Low Risk Adult (1 of 2 - PCV13) Never done   COVID-19 Vaccine (3 - Pfizer risk series) 10/16/2019   INFLUENZA VACCINE  12/07/2020   TETANUS/TDAP  11/25/2026   HPV VACCINES  Aged Out    Health Maintenance  Health Maintenance Due  Topic Date Due   Hepatitis C Screening  Never done   Zoster Vaccines- Shingrix (1 of 2) Never done   COLONOSCOPY (Pts 45-59yrs Insurance coverage will need to be confirmed)  Never done   MAMMOGRAM  Never done   DEXA SCAN  Never done   PNA vac Low Risk Adult (1 of 2 - PCV13) Never done   COVID-19 Vaccine (3 - Pfizer risk series) 10/16/2019   INFLUENZA VACCINE  12/07/2020    Colorectal rectal status: Declined  Mammogram status: Declined  Bone density status: Declined  Lung Cancer Screening: (Low Dose CT Chest recommended if Age 67-80 years, 30 pack-year currently smoking OR have quit w/in 15years.) does not qualify.     Additional Screening:  Hepatitis C Screening: does qualify; Declined  Vision Screening: Recommended annual ophthalmology exams for early detection of glaucoma and other disorders of the eye. Is the patient up to date with their annual eye exam?  No  Who is the provider or what is the name of the office in which the patient attends annual eye exams? None If pt is  not established with a provider, would they like to be referred to a provider to establish  care? No .   Dental Screening: Recommended annual dental exams for proper oral hygiene  Community Resource Referral / Chronic Care Management: CRR required this visit?  No   CCM required this visit?  No      Plan:     I have personally reviewed and noted the following in the patient's chart:   Medical and social history Use of alcohol, tobacco or illicit drugs  Current medications and supplements including opioid prescriptions. Patient is not currently taking opioid prescriptions. Functional ability and status Nutritional status Physical activity Advanced directives List of other physicians Hospitalizations, surgeries, and ER visits in previous 12 months Vitals Screenings to include cognitive, depression, and falls Referrals and appointments  In addition, I have reviewed and discussed with patient certain preventive protocols, quality metrics, and best practice recommendations. A written personalized care plan for preventive services as well as general preventive health recommendations were provided to patient.     Roanna Raider, LPN   10/08/930  Nurse Health Advisor  Nurse Notes: None   Reviewed and Agree with plan & assessment of RN  Esperanza Richters, PA-C

## 2020-12-29 NOTE — Patient Instructions (Signed)
Taylor Wood , Thank you for taking time to come for your Medicare Wellness Visit. I appreciate your ongoing commitment to your health goals. Please review the following plan we discussed and let me know if I can assist you in the future.   Screening recommendations/referrals: Colonoscopy: Declined.Please call the office to schedule if you change your mind. Mammogram:Declined.Please call the office to schedule if you change your mind.  Bone Density: Declined.Please call the office to schedule if you change your mind. Recommended yearly ophthalmology/optometry visit for glaucoma screening and checkup Recommended yearly dental visit for hygiene and checkup  Vaccinations: Influenza vaccine: Declined Pneumococcal vaccine: Declined Tdap vaccine: Up to date-Due-11/25/2026 Shingles vaccine: Discuss with pharmacy   Covid-19:Booster due  Advanced directives: Declined  Conditions/risks identified: See problem list  Next appointment: Follow up in one year for your annual wellness visit    Preventive Care 65 Years and Older, Female Preventive care refers to lifestyle choices and visits with your health care provider that can promote health and wellness. What does preventive care include? A yearly physical exam. This is also called an annual well check. Dental exams once or twice a year. Routine eye exams. Ask your health care provider how often you should have your eyes checked. Personal lifestyle choices, including: Daily care of your teeth and gums. Regular physical activity. Eating a healthy diet. Avoiding tobacco and drug use. Limiting alcohol use. Practicing safe sex. Taking low-dose aspirin every day. Taking vitamin and mineral supplements as recommended by your health care provider. What happens during an annual well check? The services and screenings done by your health care provider during your annual well check will depend on your age, overall health, lifestyle risk factors, and  family history of disease. Counseling  Your health care provider may ask you questions about your: Alcohol use. Tobacco use. Drug use. Emotional well-being. Home and relationship well-being. Sexual activity. Eating habits. History of falls. Memory and ability to understand (cognition). Work and work Astronomer. Reproductive health. Screening  You may have the following tests or measurements: Height, weight, and BMI. Blood pressure. Lipid and cholesterol levels. These may be checked every 5 years, or more frequently if you are over 28 years old. Skin check. Lung cancer screening. You may have this screening every year starting at age 20 if you have a 30-pack-year history of smoking and currently smoke or have quit within the past 15 years. Fecal occult blood test (FOBT) of the stool. You may have this test every year starting at age 52. Flexible sigmoidoscopy or colonoscopy. You may have a sigmoidoscopy every 5 years or a colonoscopy every 10 years starting at age 50. Hepatitis C blood test. Hepatitis B blood test. Sexually transmitted disease (STD) testing. Diabetes screening. This is done by checking your blood sugar (glucose) after you have not eaten for a while (fasting). You may have this done every 1-3 years. Bone density scan. This is done to screen for osteoporosis. You may have this done starting at age 83. Mammogram. This may be done every 1-2 years. Talk to your health care provider about how often you should have regular mammograms. Talk with your health care provider about your test results, treatment options, and if necessary, the need for more tests. Vaccines  Your health care provider may recommend certain vaccines, such as: Influenza vaccine. This is recommended every year. Tetanus, diphtheria, and acellular pertussis (Tdap, Td) vaccine. You may need a Td booster every 10 years. Zoster vaccine. You may need this after  age 62. Pneumococcal 13-valent conjugate  (PCV13) vaccine. One dose is recommended after age 32. Pneumococcal polysaccharide (PPSV23) vaccine. One dose is recommended after age 57. Talk to your health care provider about which screenings and vaccines you need and how often you need them. This information is not intended to replace advice given to you by your health care provider. Make sure you discuss any questions you have with your health care provider. Document Released: 05/22/2015 Document Revised: 01/13/2016 Document Reviewed: 02/24/2015 Elsevier Interactive Patient Education  2017 [place] Prevention in the Home Falls can cause injuries. They can happen to people of all ages. There are many things you can do to make your home safe and to help prevent falls. What can I do on the outside of my home? Regularly fix the edges of walkways and driveways and fix any cracks. Remove anything that might make you trip as you walk through a door, such as a raised step or threshold. Trim any bushes or trees on the path to your home. Use bright outdoor lighting. Clear any walking paths of anything that might make someone trip, such as rocks or tools. Regularly check to see if handrails are loose or broken. Make sure that both sides of any steps have handrails. Any raised decks and porches should have guardrails on the edges. Have any leaves, snow, or ice cleared regularly. Use sand or salt on walking paths during winter. Clean up any spills in your garage right away. This includes oil or grease spills. What can I do in the bathroom? Use night lights. Install grab bars by the toilet and in the tub and shower. Do not use towel bars as grab bars. Use non-skid mats or decals in the tub or shower. If you need to sit down in the shower, use a plastic, non-slip stool. Keep the floor dry. Clean up any water that spills on the floor as soon as it happens. Remove soap buildup in the tub or shower regularly. Attach bath mats securely with  double-sided non-slip rug tape. Do not have throw rugs and other things on the floor that can make you trip. What can I do in the bedroom? Use night lights. Make sure that you have a light by your bed that is easy to reach. Do not use any sheets or blankets that are too big for your bed. They should not hang down onto the floor. Have a firm chair that has side arms. You can use this for support while you get dressed. Do not have throw rugs and other things on the floor that can make you trip. What can I do in the kitchen? Clean up any spills right away. Avoid walking on wet floors. Keep items that you use a lot in easy-to-reach places. If you need to reach something above you, use a strong step stool that has a grab bar. Keep electrical cords out of the way. Do not use floor polish or wax that makes floors slippery. If you must use wax, use non-skid floor wax. Do not have throw rugs and other things on the floor that can make you trip. What can I do with my stairs? Do not leave any items on the stairs. Make sure that there are handrails on both sides of the stairs and use them. Fix handrails that are broken or loose. Make sure that handrails are as long as the stairways. Check any carpeting to make sure that it is firmly attached to the stairs.  Fix any carpet that is loose or worn. Avoid having throw rugs at the top or bottom of the stairs. If you do have throw rugs, attach them to the floor with carpet tape. Make sure that you have a light switch at the top of the stairs and the bottom of the stairs. If you do not have them, ask someone to add them for you. What else can I do to help prevent falls? Wear shoes that: Do not have high heels. Have rubber bottoms. Are comfortable and fit you well. Are closed at the toe. Do not wear sandals. If you use a stepladder: Make sure that it is fully opened. Do not climb a closed stepladder. Make sure that both sides of the stepladder are locked  into place. Ask someone to hold it for you, if possible. Clearly mark and make sure that you can see: Any grab bars or handrails. First and last steps. Where the edge of each step is. Use tools that help you move around (mobility aids) if they are needed. These include: Canes. Walkers. Scooters. Crutches. Turn on the lights when you go into a dark area. Replace any light bulbs as soon as they burn out. Set up your furniture so you have a clear path. Avoid moving your furniture around. If any of your floors are uneven, fix them. If there are any pets around you, be aware of where they are. Review your medicines with your doctor. Some medicines can make you feel dizzy. This can increase your chance of falling. Ask your doctor what other things that you can do to help prevent falls. This information is not intended to replace advice given to you by your health care provider. Make sure you discuss any questions you have with your health care provider. Document Released: 02/19/2009 Document Revised: 10/01/2015 Document Reviewed: 05/30/2014 Elsevier Interactive Patient Education  2017 [employer].

## 2020-12-29 NOTE — Progress Notes (Signed)
Subjective:    Patient ID: Taylor Wood, female    DOB: January 24, 1951, 70 y.o.   MRN: [id number]  HPI  Pt in for follow up. Pt has been back on her bp meds since last visit. Had been off of med at time of last bp check.  Pt is on zestoretic 10-12.5 mg daily.   Pt cholesterol was elevated on last labs. Rx'd statin to restart.  Pt has hx of gerd. Used to use protonix. Occurs intermittently. She request refill.   Hx of diabetes but 3 month blood sugar average was controlled.    Review of Systems  Constitutional:  Negative for chills, fatigue and fever.  HENT:  Negative for congestion and drooling.   Respiratory:  Negative for cough, chest tightness, shortness of breath and wheezing.   Cardiovascular:  Negative for chest pain and palpitations.  Gastrointestinal:  Negative for abdominal pain, diarrhea and rectal pain.  Musculoskeletal:  Negative for back pain, joint swelling and myalgias.  Skin:  Negative for rash.  Neurological:  Negative for dizziness, tremors, numbness and headaches.  Hematological:  Negative for adenopathy. Does not bruise/bleed easily.  Psychiatric/Behavioral:  Negative for behavioral problems, decreased concentration, dysphoric mood and hallucinations. The patient is not nervous/anxious.    Past Medical History:  Diagnosis Date   HA (headache)    Hypertension    Seizure (HCC)    Viral encephalitis    Viral meningitis      Social History   Socioeconomic History   Marital status: Legally Separated    Spouse name: Not on file   Number of children: Not on file   Years of education: Not on file   Highest education level: Not on file  Occupational History   Not on file  Tobacco Use   Smoking status: Never   Smokeless tobacco: Never  Substance and Sexual Activity   Alcohol use: No    Alcohol/week: 0.0 standard drinks   Drug use: No   Sexual activity: Never  Other Topics Concern   Not on file  Social History Narrative   Not on file   Social  Determinants of Health   Financial Resource Strain: Low Risk    Difficulty of Paying Living Expenses: Not hard at all  Food Insecurity: No Food Insecurity   Worried About Programme researcher, broadcasting/film/video in the Last Year: Never true   Ran Out of Food in the Last Year: Never true  Transportation Needs: No Transportation Needs   Lack of Transportation (Medical): No   Lack of Transportation (Non-Medical): No  Physical Activity: Inactive   Days of Exercise per Week: 0 days   Minutes of Exercise per Session: 0 min  Stress: No Stress Concern Present   Feeling of Stress : Not at all  Social Connections: Socially Isolated   Frequency of Communication with Friends and Family: More than three times a week   Frequency of Social Gatherings with Friends and Family: More than three times a week   Attends Religious Services: Never   Database administrator or Organizations: No   Attends Banker Meetings: Never   Marital Status: Separated  Intimate Partner Violence: Not At Risk   Fear of Current or Ex-Partner: No   Emotionally Abused: No   Physically Abused: No   Sexually Abused: No    Past Surgical History:  Procedure Laterality Date   ABDOMINAL HYSTERECTOMY      Family History  Problem Relation Age of Onset   Hypertension Mother  Heart disease Mother    Diabetes Mother    Hypertension Father    Alzheimer's disease Father    Diabetes Maternal Grandmother    Hypertension Maternal Aunt    Diabetes Maternal Aunt    Hypertension Sister    Healthy Brother        x3   Healthy Daughter        x4   Seizures Daughter    Healthy Son        x1    No Known Allergies  Current Outpatient Medications on File Prior to Visit  Medication Sig Dispense Refill   atorvastatin (LIPITOR) 10 MG tablet Take 1 tablet (10 mg total) by mouth daily. 30 tablet 3   baclofen (LIORESAL) 10 MG tablet Take 10 mg by mouth 3 (three) times daily.     carbamazepine (TEGRETOL) 100 MG chewable tablet Chew 300 mg  by mouth 2 (two) times daily.     Cholecalciferol (VITAMIN D-3) 1000 UNITS CAPS Take by mouth daily.     lisinopril-hydrochlorothiazide (PRINZIDE,ZESTORETIC) 10-12.5 MG tablet TAKE 1 TABLET BY MOUTH DAILY 90 tablet 0   lisinopril-hydrochlorothiazide (ZESTORETIC) 10-12.5 MG tablet TAKE 1 TABLET BY MOUTH DAILY 90 tablet 2   Multiple Minerals-Vitamins (CALCIUM & VIT D3 BONE HEALTH PO) Take by mouth daily.     naproxen (NAPROSYN) 500 MG tablet Take 1 tablet (500 mg total) by mouth 2 (two) times daily as needed (for shoulder pain). 20 tablet 0   pantoprazole (PROTONIX) 40 MG tablet TAKE 1 TABLET BY MOUTH EVERY DAY AT 6:00AM 90 tablet 1   potassium chloride (K-DUR) 10 MEQ tablet TAKE 1 TABLET(10 MEQ) BY MOUTH DAILY 90 tablet 0   No current facility-administered medications on file prior to visit.    BP (!) 150/84   Pulse 97   Temp 98 F (36.7 C)   Resp 16   Ht 5\' 1"  (1.549 m)   Wt 115 lb (52.2 kg)   SpO2 100%   BMI 21.73 kg/m        Objective:   Physical Exam  General Mental Status- Alert. General Appearance- Not in acute distress.   Skin General: Color- Normal Color. Moisture- Normal Moisture.  Neck Carotid Arteries- Normal color. Moisture- Normal Moisture. No carotid bruits. No JVD.  Chest and Lung Exam Auscultation: Breath Sounds:-Normal.  Cardiovascular Auscultation:Rythm- Regular. Murmurs & Other Heart Sounds:Auscultation of the heart reveals- No Murmurs.  Abdomen Inspection:-Inspeection Normal. Palpation/Percussion:Note:No mass. Palpation and Percussion of the abdomen reveal- Non Tender, Non Distended + BS, no rebound or guarding.   Neurologic Cranial Nerve exam:- CN III-XII intact(No nystagmus), symmetric smile. Strength:- 5/5 equal and symmetric strength both upper and lower extremities.       Assessment & Plan:   Your bp is better today on 3rd recheck bp 130/80. Continue current zestoretic 10/12.5 daily. Do recommend get bp cuff over the counter and check  periodically to confirm less than 140/90.  For high cholesterol rx statin sent to pharmacy on recent lab review..  For elevated sugar average recently recommend low sugar diet. A1c in prediabetic range though in past a1c was 6.8. If sugar average worsen on rechecks would recommend diabetic meds.   For gerd rx protonix.  For transverse myelitis follow up with neurologist.  Follow up in 3 months or sooner if needed

## 2020-12-31 DIAGNOSIS — D899 Disorder involving the immune mechanism, unspecified: Secondary | ICD-10-CM | POA: Diagnosis not present

## 2020-12-31 DIAGNOSIS — G360 Neuromyelitis optica [Devic]: Secondary | ICD-10-CM | POA: Diagnosis not present

## 2021-01-22 DIAGNOSIS — D899 Disorder involving the immune mechanism, unspecified: Secondary | ICD-10-CM | POA: Diagnosis not present

## 2021-03-01 ENCOUNTER — Other Ambulatory Visit: Payer: Self-pay | Admitting: Medical

## 2021-03-31 ENCOUNTER — Ambulatory Visit: Payer: Medicare Other | Admitting: Medical

## 2021-04-01 ENCOUNTER — Other Ambulatory Visit: Payer: Self-pay | Admitting: Medical

## 2021-05-11 DIAGNOSIS — G360 Neuromyelitis optica [Devic]: Secondary | ICD-10-CM | POA: Diagnosis not present

## 2021-05-11 DIAGNOSIS — Z79899 Other long term (current) drug therapy: Secondary | ICD-10-CM | POA: Diagnosis not present

## 2021-05-11 DIAGNOSIS — E782 Mixed hyperlipidemia: Secondary | ICD-10-CM | POA: Diagnosis not present

## 2021-08-22 ENCOUNTER — Other Ambulatory Visit: Payer: Self-pay | Admitting: Medical

## 2021-08-22 DIAGNOSIS — I10 Essential (primary) hypertension: Secondary | ICD-10-CM

## 2021-08-25 ENCOUNTER — Other Ambulatory Visit: Payer: Self-pay | Admitting: Medical

## 2021-08-26 ENCOUNTER — Ambulatory Visit (INDEPENDENT_AMBULATORY_CARE_PROVIDER_SITE_OTHER): Payer: Medicare Other | Admitting: Medical

## 2021-08-26 ENCOUNTER — Encounter: Payer: Self-pay | Admitting: Medical

## 2021-08-26 VITALS — BP 130/70 | HR 87 | Temp 98.7°F | Resp 18 | Ht 61.0 in | Wt 116.2 lb

## 2021-08-26 DIAGNOSIS — G373 Acute transverse myelitis in demyelinating disease of central nervous system: Secondary | ICD-10-CM

## 2021-08-26 DIAGNOSIS — R739 Hyperglycemia, unspecified: Secondary | ICD-10-CM | POA: Diagnosis not present

## 2021-08-26 DIAGNOSIS — E782 Mixed hyperlipidemia: Secondary | ICD-10-CM

## 2021-08-26 DIAGNOSIS — I10 Essential (primary) hypertension: Secondary | ICD-10-CM

## 2021-08-26 LAB — COMPREHENSIVE METABOLIC PANEL
ALT: 19 U/L (ref 0–35)
AST: 17 U/L (ref 0–37)
Albumin: 4.9 g/dL (ref 3.5–5.2)
Alkaline Phosphatase: 63 U/L (ref 39–117)
BUN: 12 mg/dL (ref 6–23)
CO2: 32 mEq/L (ref 19–32)
Calcium: 10 mg/dL (ref 8.4–10.5)
Chloride: 95 mEq/L — ABNORMAL LOW (ref 96–112)
Creatinine, Ser: 0.57 mg/dL (ref 0.40–1.20)
GFR: 93.3 mL/min (ref >60.00)
Glucose, Bld: 138 mg/dL — ABNORMAL HIGH (ref 70–99)
Potassium: 3.8 mEq/L (ref 3.5–5.1)
Sodium: 135 mEq/L (ref 135–145)
Total Bilirubin: 0.4 mg/dL (ref 0.2–1.2)
Total Protein: 7.7 g/dL (ref 6.0–8.3)

## 2021-08-26 LAB — HEMOGLOBIN A1C: Hgb A1c MFr Bld: 6.1 % (ref 4.6–6.5)

## 2021-08-26 NOTE — Progress Notes (Signed)
? ?Subjective:  ? ? Patient ID: Taylor Wood, female    DOB: Jun 14, 1950, 71 y.o.   MRN: [id number] ? ?HPI ? ?Pt in for follow up. ? ?Pt has htn. Bp mild high initially. Zestoretic 10-12.5 mg daily. ? ?High cholesterol. Last lipid panel check in January showed elevated level. Pt is on atorvastatin 10 mg daily.  ? ?Gerd controlled with protonix. ? ?August a1c was 6.1. 6 years ago 6.8.  pt only eating low sugar diet. ? ?Pt has neuromyelitis opticans. Pt sees neurologist. 05/11/21 note plan. ? ?PLAN: ?- Continue Satralizumab. ?- Check CBC with differential, CMP, and fasting lipid profile (she is fasting). ?- Continue Carbatrol and baclofen for tonic spasms. ?- Follow-up in 6 months and earlier if needed. ? ? ? ?Review of Systems  ?Constitutional:  Negative for chills, fatigue and fever.  ?HENT:  Negative for congestion, drooling and ear pain.   ?Respiratory:  Negative for cough, chest tightness, shortness of breath and wheezing.   ?Cardiovascular:  Negative for chest pain and palpitations.  ?Gastrointestinal:  Negative for abdominal pain, blood in stool, diarrhea and nausea.  ?Genitourinary:  Negative for dysuria.  ?Musculoskeletal:  Negative for back pain, joint swelling and neck pain.  ?Skin:  Negative for rash.  ?Neurological:  Negative for dizziness, speech difficulty, weakness, numbness and headaches.  ?Hematological:  Negative for adenopathy. Does not bruise/bleed easily.  ?Psychiatric/Behavioral:  Negative for behavioral problems, decreased concentration and hallucinations. The patient is not nervous/anxious.   ? ? ?Past Medical History:  ?Diagnosis Date  ? HA (headache)   ? Hypertension   ? Seizure (HCC)   ? Viral encephalitis   ? Viral meningitis   ? ?  ?Social History  ? ?Socioeconomic History  ? Marital status: Legally Separated  ?  Spouse name: Not on file  ? Number of children: Not on file  ? Years of education: Not on file  ? Highest education level: Not on file  ?Occupational History  ? Not on file  ?Tobacco  Use  ? Smoking status: Never  ? Smokeless tobacco: Never  ?Substance and Sexual Activity  ? Alcohol use: No  ?  Alcohol/week: 0.0 standard drinks  ? Drug use: No  ? Sexual activity: Never  ?Other Topics Concern  ? Not on file  ?Social History Narrative  ? Not on file  ? ?Social Determinants of Health  ? ?Financial Resource Strain: Low Risk   ? Difficulty of Paying Living Expenses: Not hard at all  ?Food Insecurity: No Food Insecurity  ? Worried About Programme researcher, broadcasting/film/video in the Last Year: Never true  ? Ran Out of Food in the Last Year: Never true  ?Transportation Needs: No Transportation Needs  ? Lack of Transportation (Medical): No  ? Lack of Transportation (Non-Medical): No  ?Physical Activity: Inactive  ? Days of Exercise per Week: 0 days  ? Minutes of Exercise per Session: 0 min  ?Stress: No Stress Concern Present  ? Feeling of Stress : Not at all  ?Social Connections: Socially Isolated  ? Frequency of Communication with Friends and Family: More than three times a week  ? Frequency of Social Gatherings with Friends and Family: More than three times a week  ? Attends Religious Services: Never  ? Active Member of Clubs or Organizations: No  ? Attends Banker Meetings: Never  ? Marital Status: Separated  ?Intimate Partner Violence: Not At Risk  ? Fear of Current or Ex-Partner: No  ? Emotionally Abused: No  ?  Physically Abused: No  ? Sexually Abused: No  ? ? ?Past Surgical History:  ?Procedure Laterality Date  ? ABDOMINAL HYSTERECTOMY    ? ? ?Family History  ?Problem Relation Age of Onset  ? Hypertension Mother   ? Heart disease Mother   ? Diabetes Mother   ? Hypertension Father   ? Alzheimer's disease Father   ? Diabetes Maternal Grandmother   ? Hypertension Maternal Aunt   ? Diabetes Maternal Aunt   ? Hypertension Sister   ? Healthy Brother   ?     x3  ? Healthy Daughter   ?     x4  ? Seizures Daughter   ? Healthy Son   ?     x1  ? ? ?No Known Allergies ? ?Current Outpatient Medications on File Prior  to Visit  ?Medication Sig Dispense Refill  ? atorvastatin (LIPITOR) 10 MG tablet TAKE 1 TABLET BY MOUTH DAILY 30 tablet 3  ? baclofen (LIORESAL) 10 MG tablet Take 10 mg by mouth 3 (three) times daily.    ? carbamazepine (TEGRETOL) 100 MG chewable tablet Chew 300 mg by mouth 2 (two) times daily.    ? Cholecalciferol (VITAMIN D-3) 1000 UNITS CAPS Take by mouth daily.    ? lisinopril-hydrochlorothiazide (PRINZIDE,ZESTORETIC) 10-12.5 MG tablet TAKE 1 TABLET BY MOUTH DAILY 90 tablet 0  ? lisinopril-hydrochlorothiazide (ZESTORETIC) 10-12.5 MG tablet TAKE 1 TABLET BY MOUTH DAILY 90 tablet 2  ? Multiple Minerals-Vitamins (CALCIUM & VIT D3 BONE HEALTH PO) Take by mouth daily.    ? naproxen (NAPROSYN) 500 MG tablet Take 1 tablet (500 mg total) by mouth 2 (two) times daily as needed (for shoulder pain). 20 tablet 0  ? pantoprazole (PROTONIX) 40 MG tablet TAKE 1 TABLET BY MOUTH EVERY DAY AT 6 AM 90 tablet 1  ? potassium chloride (K-DUR) 10 MEQ tablet TAKE 1 TABLET(10 MEQ) BY MOUTH DAILY 90 tablet 0  ? ?No current facility-administered medications on file prior to visit.  ? ? ?BP 140/60   Pulse 87   Temp 98.7 ?F (37.1 ?C)   Resp 18   Ht 5\' 1"  (1.549 m)   Wt 116 lb 3.2 oz (52.7 kg)   SpO2 100%   BMI 21.96 kg/m?  ?  ?   ?Objective:  ? Physical Exam ? ? ?General ?Mental Status- Alert. General Appearance- Not in acute distress.  ? ?Skin ?General: Color- Normal Color. Moisture- Normal Moisture. ? ?Neck ?Carotid Arteries- Normal color. Moisture- Normal Moisture. No carotid bruits. No JVD. ? ?Chest and Lung Exam ?Auscultation: ?Breath Sounds:-Normal. ? ?Cardiovascular ?Auscultation:Rythm- Regular. ?Murmurs & Other Heart Sounds:Auscultation of the heart reveals- No Murmurs. ? ?Abdomen ?Inspection:-Inspeection Normal. ?Palpation/Percussion:Note:No mass. Palpation and Percussion of the abdomen reveal- Non Tender, Non Distended + BS, no rebound or guarding. ? ?Neurologic ?Cranial Nerve exam:- CN III-XII intact(No nystagmus), eoms  intact. ? ? ?   ?Assessment & Plan:  ? ?Patient Instructions  ?Htn- well controlled on recheck today. Continue Zestoretic 10-12.5 mg daily. ? ?Hyperlipidemia. Recent lipid panel checked January. Continue atorvastatin 10 mg daily.  ? ?Gerd controlled with protonix. ? ?Will recheck a1c and cmp today for elevated sugar/hx of diabetes  by a1c  6 years ago. Continue low sugar diet. ? ?For neuromyelitis opticans continue to follow up with neurologist. ? ?Follow up date to be determined after lab review ? ?  ?February, PA-C  ?

## 2021-08-26 NOTE — Patient Instructions (Addendum)
Htn- well controlled on recheck today. Continue Zestoretic 10-12.5 mg daily. ? ?Hyperlipidemia. Recent lipid panel checked January. Continue atorvastatin 10 mg daily.  ? ?Gerd controlled with protonix. ? ?Will recheck a1c and cmp today for elevated sugar/hx of diabetes  by a1c  6 years ago. Continue low sugar diet. ? ?For neuromyelitis opticans continue to follow up with neurologist. ? ?Follow up date to be determined after lab review ? ? ?

## 2022-01-03 ENCOUNTER — Ambulatory Visit (INDEPENDENT_AMBULATORY_CARE_PROVIDER_SITE_OTHER): Payer: Medicare Other

## 2022-01-03 VITALS — Ht 61.0 in | Wt 116.0 lb

## 2022-01-03 DIAGNOSIS — Z0000 Encounter for general adult medical examination without abnormal findings: Secondary | ICD-10-CM | POA: Diagnosis not present

## 2022-01-03 NOTE — Progress Notes (Addendum)
Subjective:   Taylor Wood is a 71 y.o. female who presents for Medicare Annual (Subsequent) preventive examination.  I connected with Vanda today by telephone and verified that I am speaking with the correct person using two identifiers. Location patient: home Location provider: work Persons participating in the virtual visit: patient, Engineer, civil (consulting).    I discussed the limitations, risks, security and privacy concerns of performing an evaluation and management service by telephone and the availability of in person appointments. I also discussed with the patient that there may be a patient responsible charge related to this service. The patient expressed understanding and verbally consented to this telephonic visit.    Interactive audio and video telecommunications were attempted between this provider and patient, however failed, due to patient having technical difficulties OR patient did not have access to video capability.  We continued and completed visit with audio only.  Some vital signs may be absent or patient reported.   Time Spent with patient on telephone encounter: 20 minutes   Review of Systems     Cardiac Risk Factors include: dyslipidemia;hypertension     Objective:    Today's Vitals   01/03/22 0949  Weight: 116 lb (52.6 kg)  Height: 5\' 1"  (1.549 m)   Body mass index is 21.92 kg/m.     01/03/2022    9:51 AM 12/29/2020    1:55 PM 09/02/2017    1:10 PM 07/13/2015   10:42 PM 06/07/2014    3:56 PM 03/23/2014    3:49 PM 02/04/2014    5:34 PM  Advanced Directives  Does Patient Have a Medical Advance Directive? No No No No No No No  Would patient like information on creating a medical advance directive?  No - Patient declined No - Patient declined No - patient declined information No - patient declined information No - patient declined information     Current Medications (verified) Outpatient Encounter Medications as of 01/03/2022  Medication Sig   atorvastatin (LIPITOR) 10  MG tablet TAKE 1 TABLET BY MOUTH DAILY   baclofen (LIORESAL) 10 MG tablet Take 10 mg by mouth 3 (three) times daily.   carbamazepine (TEGRETOL) 100 MG chewable tablet Chew 300 mg by mouth 2 (two) times daily.   Cholecalciferol (VITAMIN D-3) 1000 UNITS CAPS Take by mouth daily.   lisinopril-hydrochlorothiazide (PRINZIDE,ZESTORETIC) 10-12.5 MG tablet TAKE 1 TABLET BY MOUTH DAILY   lisinopril-hydrochlorothiazide (ZESTORETIC) 10-12.5 MG tablet TAKE 1 TABLET BY MOUTH DAILY   Multiple Minerals-Vitamins (CALCIUM & VIT D3 BONE HEALTH PO) Take by mouth daily.   naproxen (NAPROSYN) 500 MG tablet Take 1 tablet (500 mg total) by mouth 2 (two) times daily as needed (for shoulder pain).   pantoprazole (PROTONIX) 40 MG tablet TAKE 1 TABLET BY MOUTH EVERY DAY AT 6 AM   potassium chloride (K-DUR) 10 MEQ tablet TAKE 1 TABLET(10 MEQ) BY MOUTH DAILY   No facility-administered encounter medications on file as of 01/03/2022.    Allergies (verified) Patient has no known allergies.   History: Past Medical History:  Diagnosis Date   HA (headache)    Hypertension    Seizure (HCC)    Viral encephalitis    Viral meningitis    Past Surgical History:  Procedure Laterality Date   ABDOMINAL HYSTERECTOMY     Family History  Problem Relation Age of Onset   Hypertension Mother    Heart disease Mother    Diabetes Mother    Hypertension Father    Alzheimer's disease Father    Diabetes  Maternal Grandmother    Hypertension Maternal Aunt    Diabetes Maternal Aunt    Hypertension Sister    Healthy Brother        x3   Healthy Daughter        x4   Seizures Daughter    Healthy Son        x1   Social History   Socioeconomic History   Marital status: Legally Separated    Spouse name: Not on file   Number of children: Not on file   Years of education: Not on file   Highest education level: Not on file  Occupational History   Not on file  Tobacco Use   Smoking status: Never   Smokeless tobacco: Never   Substance and Sexual Activity   Alcohol use: No    Alcohol/week: 0.0 standard drinks of alcohol   Drug use: No   Sexual activity: Never  Other Topics Concern   Not on file  Social History Narrative   Not on file   Social Determinants of Health   Financial Resource Strain: Low Risk  (01/03/2022)   Overall Financial Resource Strain (CARDIA)    Difficulty of Paying Living Expenses: Not hard at all  Food Insecurity: No Food Insecurity (01/03/2022)   Hunger Vital Sign    Worried About Running Out of Food in the Last Year: Never true    Ran Out of Food in the Last Year: Never true  Transportation Needs: No Transportation Needs (01/03/2022)   PRAPARE - Administrator, Civil Service (Medical): No    Lack of Transportation (Non-Medical): No  Physical Activity: Inactive (01/03/2022)   Exercise Vital Sign    Days of Exercise per Week: 0 days    Minutes of Exercise per Session: 0 min  Stress: No Stress Concern Present (01/03/2022)   [employer] of Occupational Health - Occupational Stress Questionnaire    Feeling of Stress : Not at all  Social Connections: Socially Isolated (01/03/2022)   Social Connection and Isolation Panel [NHANES]    Frequency of Communication with Friends and Family: More than three times a week    Frequency of Social Gatherings with Friends and Family: More than three times a week    Attends Religious Services: Never    Database administrator or Organizations: No    Attends Engineer, structural: Never    Marital Status: Separated    Tobacco Counseling Counseling given: Not Answered   Clinical Intake:  Pre-visit preparation completed: Yes  Pain : No/denies pain     BMI - recorded: 21.92 Nutritional Status: BMI of 19-24  Normal Nutritional Risks: None Diabetes: No  How often do you need to have someone help you when you read instructions, pamphlets, or other written materials from your doctor or pharmacy?: 1 -  Never  Diabetic?No  Interpreter Needed?: No  Information entered by :: Thomasenia Sales LPN   Activities of Daily Living    01/03/2022    9:54 AM  In your present state of health, do you have any difficulty performing the following activities:  Hearing? 0  Vision? 0  Difficulty concentrating or making decisions? 0  Walking or climbing stairs? 0  Dressing or bathing? 0  Doing errands, shopping? 0  Preparing Food and eating ? N  Using the Toilet? N  In the past six months, have you accidently leaked urine? N  Do you have problems with loss of bowel control? N  Managing your Medications?  N  Managing your Finances? N  Housekeeping or managing your Housekeeping? N    Patient Care Team: Saguier, Kateri Mc as PCP - General (Internal Medicine)  Indicate any recent Medical Services you may have received from other than [facility] providers in the past year (date may be approximate).     Assessment:   This is a routine wellness examination for Taylor Wood.  Hearing/Vision screen Hearing Screening - Comments:: No issues Vision Screening - Comments:: Last eye exam-2-3 months ago  Dietary issues and exercise activities discussed: Current Exercise Habits: The patient does not participate in regular exercise at present, Exercise limited by: None identified   Goals Addressed             This Visit's Progress    Patient Stated       Drink more water & eat more vegetables       Depression Screen    01/03/2022    9:54 AM 12/29/2020    1:57 PM 12/07/2020    2:13 PM 08/25/2015    8:54 AM 02/21/2013    2:24 PM  PHQ 2/9 Scores  PHQ - 2 Score 0 0 0 0 0  Exception Documentation    Patient refusal     Fall Risk    01/03/2022    9:52 AM 12/29/2020    1:56 PM 12/07/2020    2:13 PM 12/10/2018    9:07 AM 08/25/2015    8:54 AM  Fall Risk   Falls in the past year? 0 0 0 0 No  Comment    Emmi Telephone Survey: data to providers prior to load   Number falls in past yr: 0 0 0    Injury with  Fall? 0 0 0    Follow up Falls prevention discussed Falls prevention discussed       FALL RISK PREVENTION PERTAINING TO THE HOME:  Any stairs in or around the home? Yes  If so, are there any without handrails? No  Home free of loose throw rugs in walkways, pet beds, electrical cords, etc? Yes  Adequate lighting in your home to reduce risk of falls? Yes   ASSISTIVE DEVICES UTILIZED TO PREVENT FALLS:  Life alert? No  Use of a cane, walker or w/c? No  Grab bars in the bathroom? No  Shower chair or bench in shower? Yes  Elevated toilet seat or a handicapped toilet? No   TIMED UP AND GO:  Was the test performed? No . Phone visit   Cognitive Function:        01/03/2022    9:59 AM  6CIT Screen  What Year? 0 points  What month? 0 points  What time? 0 points  Count back from 20 0 points  Months in reverse 4 points  Repeat phrase 2 points  Total Score 6 points    Immunizations Immunization History  Administered Date(s) Administered   PFIZER(Purple Top)SARS-COV-2 Vaccination 08/01/2019, 09/18/2019   Tdap 11/24/2016    TDAP status: Up to date  Flu Vaccine status: Declined, Education has been provided regarding the importance of this vaccine but patient still declined. Advised may receive this vaccine at local pharmacy or Health Dept. Aware to provide a copy of the vaccination record if obtained from local pharmacy or Health Dept. Verbalized acceptance and understanding.  Pneumococcal vaccine status: Due, Education has been provided regarding the importance of this vaccine. Advised may receive this vaccine at local pharmacy or Health Dept. Aware to provide a copy of the vaccination record if  obtained from local pharmacy or Health Dept. Verbalized acceptance and understanding.  Covid-19 vaccine status: Information provided on how to obtain vaccines.   Qualifies for Shingles Vaccine? Yes   Zostavax completed No   Shingrix Completed?: No.    Education has been provided  regarding the importance of this vaccine. Patient has been advised to call insurance company to determine out of pocket expense if they have not yet received this vaccine. Advised may also receive vaccine at local pharmacy or Health Dept. Verbalized acceptance and understanding.  Screening Tests Health Maintenance  Topic Date Due   Hepatitis C Screening  Never done   Zoster Vaccines- Shingrix (1 of 2) Never done   COLONOSCOPY (Pts 45-41yrs Insurance coverage will need to be confirmed)  Never done   MAMMOGRAM  Never done   Pneumonia Vaccine 65+ Years old (1 - PCV) Never done   DEXA SCAN  Never done   COVID-19 Vaccine (3 - Pfizer risk series) 10/16/2019   INFLUENZA VACCINE  12/07/2021   TETANUS/TDAP  11/25/2026   HPV VACCINES  Aged Out    Health Maintenance  Health Maintenance Due  Topic Date Due   Hepatitis C Screening  Never done   Zoster Vaccines- Shingrix (1 of 2) Never done   COLONOSCOPY (Pts 45-75yrs Insurance coverage will need to be confirmed)  Never done   MAMMOGRAM  Never done   Pneumonia Vaccine 74+ Years old (1 - PCV) Never done   DEXA SCAN  Never done   COVID-19 Vaccine (3 - Pfizer risk series) 10/16/2019   INFLUENZA VACCINE  12/07/2021    Colorectal cancer screening: Declined  Mammogram status: Declined  Bone Density status: Declined  Lung Cancer Screening: (Low Dose CT Chest recommended if Age 33-80 years, 30 pack-year currently smoking OR have quit w/in 15years.) does not qualify.     Additional Screening:  Hepatitis C Screening: does qualify; Patient to discuss with PCP at next office visit  Vision Screening: Recommended annual ophthalmology exams for early detection of glaucoma and other disorders of the eye. Is the patient up to date with their annual eye exam?  Yes  Who is the provider or what is the name of the office in which the patient attends annual eye exams? Pt unsure of name   Dental Screening: Recommended annual dental exams for proper oral  hygiene  Community Resource Referral / Chronic Care Management: CRR required this visit?  No   CCM required this visit?  No      Plan:     I have personally reviewed and noted the following in the patient's chart:   Medical and social history Use of alcohol, tobacco or illicit drugs  Current medications and supplements including opioid prescriptions. Patient is not currently taking opioid prescriptions. Functional ability and status Nutritional status Physical activity Advanced directives List of other physicians Hospitalizations, surgeries, and ER visits in previous 12 months Vitals Screenings to include cognitive, depression, and falls Referrals and appointments  In addition, I have reviewed and discussed with patient certain preventive protocols, quality metrics, and best practice recommendations. A written personalized care plan for preventive services as well as general preventive health recommendations were provided to patient.   Due to this being a telephonic visit, the after visit summary with patients personalized plan was offered to patient via mail or my-chart.  Patient to pick up at office at next visit.   Roanna Raider, LPN   0/02/2724  Nurse Health Advisor  Nurse Notes: None  Review and Agree with assessment & plan of LPN   Esperanza Richters, PA-C

## 2022-01-03 NOTE — Patient Instructions (Signed)
[name] , Thank you for taking time to complete your Medicare Wellness Visit. I appreciate your ongoing commitment to your health goals. Please review the following plan we discussed and let me know if I can assist you in the future.   Screening recommendations/referrals: Colonoscopy: Declined Mammogram: Declined Bone Density: Declined Recommended yearly ophthalmology/optometry visit for glaucoma screening and checkup Recommended yearly dental visit for hygiene and checkup  Vaccinations: Influenza vaccine: Due-May obtain vaccine at our office or your local pharmacy. Pneumococcal vaccine: Due-May obtain vaccine at our office or your local pharmacy. Tdap vaccine: Up to date Shingles vaccine: Due-May obtain vaccine at your local pharmacy. Covid-19:May obtain vaccine at your local pharmacy.  Advanced directives: May pick up information at your next visit  Conditions/risks identified: See problem list  Next appointment: Follow up in one year for your annual wellness visit    Preventive Care 65 Years and Older, Female Preventive care refers to lifestyle choices and visits with your health care provider that can promote health and wellness. What does preventive care include? A yearly physical exam. This is also called an annual well check. Dental exams once or twice a year. Routine eye exams. Ask your health care provider how often you should have your eyes checked. Personal lifestyle choices, including: Daily care of your teeth and gums. Regular physical activity. Eating a healthy diet. Avoiding tobacco and drug use. Limiting alcohol use. Practicing safe sex. Taking low-dose aspirin every day. Taking vitamin and mineral supplements as recommended by your health care provider. What happens during an annual well check? The services and screenings done by your health care provider during your annual well check will depend on your age, overall health, lifestyle risk factors, and family  history of disease. Counseling  Your health care provider may ask you questions about your: Alcohol use. Tobacco use. Drug use. Emotional well-being. Home and relationship well-being. Sexual activity. Eating habits. History of falls. Memory and ability to understand (cognition). Work and work Astronomer. Reproductive health. Screening  You may have the following tests or measurements: Height, weight, and BMI. Blood pressure. Lipid and cholesterol levels. These may be checked every 5 years, or more frequently if you are over 73 years old. Skin check. Lung cancer screening. You may have this screening every year starting at age 75 if you have a 30-pack-year history of smoking and currently smoke or have quit within the past 15 years. Fecal occult blood test (FOBT) of the stool. You may have this test every year starting at age 33. Flexible sigmoidoscopy or colonoscopy. You may have a sigmoidoscopy every 5 years or a colonoscopy every 10 years starting at age 72. Hepatitis C blood test. Hepatitis B blood test. Sexually transmitted disease (STD) testing. Diabetes screening. This is done by checking your blood sugar (glucose) after you have not eaten for a while (fasting). You may have this done every 1-3 years. Bone density scan. This is done to screen for osteoporosis. You may have this done starting at age 40. Mammogram. This may be done every 1-2 years. Talk to your health care provider about how often you should have regular mammograms. Talk with your health care provider about your test results, treatment options, and if necessary, the need for more tests. Vaccines  Your health care provider may recommend certain vaccines, such as: Influenza vaccine. This is recommended every year. Tetanus, diphtheria, and acellular pertussis (Tdap, Td) vaccine. You may need a Td booster every 10 years. Zoster vaccine. You may need this  after age 37. Pneumococcal 13-valent conjugate (PCV13)  vaccine. One dose is recommended after age 20. Pneumococcal polysaccharide (PPSV23) vaccine. One dose is recommended after age 13. Talk to your health care provider about which screenings and vaccines you need and how often you need them. This information is not intended to replace advice given to you by your health care provider. Make sure you discuss any questions you have with your health care provider. Document Released: 05/22/2015 Document Revised: 01/13/2016 Document Reviewed: 02/24/2015 Elsevier Interactive Patient Education  2017 [place] Prevention in the Home Falls can cause injuries. They can happen to people of all ages. There are many things you can do to make your home safe and to help prevent falls. What can I do on the outside of my home? Regularly fix the edges of walkways and driveways and fix any cracks. Remove anything that might make you trip as you walk through a door, such as a raised step or threshold. Trim any bushes or trees on the path to your home. Use bright outdoor lighting. Clear any walking paths of anything that might make someone trip, such as rocks or tools. Regularly check to see if handrails are loose or broken. Make sure that both sides of any steps have handrails. Any raised decks and porches should have guardrails on the edges. Have any leaves, snow, or ice cleared regularly. Use sand or salt on walking paths during winter. Clean up any spills in your garage right away. This includes oil or grease spills. What can I do in the bathroom? Use night lights. Install grab bars by the toilet and in the tub and shower. Do not use towel bars as grab bars. Use non-skid mats or decals in the tub or shower. If you need to sit down in the shower, use a plastic, non-slip stool. Keep the floor dry. Clean up any water that spills on the floor as soon as it happens. Remove soap buildup in the tub or shower regularly. Attach bath mats securely with  double-sided non-slip rug tape. Do not have throw rugs and other things on the floor that can make you trip. What can I do in the bedroom? Use night lights. Make sure that you have a light by your bed that is easy to reach. Do not use any sheets or blankets that are too big for your bed. They should not hang down onto the floor. Have a firm chair that has side arms. You can use this for support while you get dressed. Do not have throw rugs and other things on the floor that can make you trip. What can I do in the kitchen? Clean up any spills right away. Avoid walking on wet floors. Keep items that you use a lot in easy-to-reach places. If you need to reach something above you, use a strong step stool that has a grab bar. Keep electrical cords out of the way. Do not use floor polish or wax that makes floors slippery. If you must use wax, use non-skid floor wax. Do not have throw rugs and other things on the floor that can make you trip. What can I do with my stairs? Do not leave any items on the stairs. Make sure that there are handrails on both sides of the stairs and use them. Fix handrails that are broken or loose. Make sure that handrails are as long as the stairways. Check any carpeting to make sure that it is firmly attached to the  stairs. Fix any carpet that is loose or worn. Avoid having throw rugs at the top or bottom of the stairs. If you do have throw rugs, attach them to the floor with carpet tape. Make sure that you have a light switch at the top of the stairs and the bottom of the stairs. If you do not have them, ask someone to add them for you. What else can I do to help prevent falls? Wear shoes that: Do not have high heels. Have rubber bottoms. Are comfortable and fit you well. Are closed at the toe. Do not wear sandals. If you use a stepladder: Make sure that it is fully opened. Do not climb a closed stepladder. Make sure that both sides of the stepladder are locked  into place. Ask someone to hold it for you, if possible. Clearly mark and make sure that you can see: Any grab bars or handrails. First and last steps. Where the edge of each step is. Use tools that help you move around (mobility aids) if they are needed. These include: Canes. Walkers. Scooters. Crutches. Turn on the lights when you go into a dark area. Replace any light bulbs as soon as they burn out. Set up your furniture so you have a clear path. Avoid moving your furniture around. If any of your floors are uneven, fix them. If there are any pets around you, be aware of where they are. Review your medicines with your doctor. Some medicines can make you feel dizzy. This can increase your chance of falling. Ask your doctor what other things that you can do to help prevent falls. This information is not intended to replace advice given to you by your health care provider. Make sure you discuss any questions you have with your health care provider. Document Released: 02/19/2009 Document Revised: 10/01/2015 Document Reviewed: 05/30/2014 Elsevier Interactive Patient Education  2017 [employer].

## 2022-05-19 ENCOUNTER — Other Ambulatory Visit: Payer: Self-pay | Admitting: Medical

## 2022-05-19 DIAGNOSIS — I10 Essential (primary) hypertension: Secondary | ICD-10-CM

## 2022-07-28 DIAGNOSIS — E7800 Pure hypercholesterolemia, unspecified: Secondary | ICD-10-CM | POA: Diagnosis not present

## 2022-07-28 DIAGNOSIS — G360 Neuromyelitis optica [Devic]: Secondary | ICD-10-CM | POA: Diagnosis not present

## 2023-01-12 NOTE — Progress Notes (Signed)
Pt did not answer phone for AWV.   This encounter was created in error - please disregard.

## 2023-02-20 ENCOUNTER — Other Ambulatory Visit: Payer: Self-pay | Admitting: Medical

## 2023-02-20 DIAGNOSIS — I10 Essential (primary) hypertension: Secondary | ICD-10-CM

## 2023-03-02 ENCOUNTER — Ambulatory Visit: Payer: Medicare Other | Admitting: *Deleted

## 2023-03-02 DIAGNOSIS — Z0000 Encounter for general adult medical examination without abnormal findings: Secondary | ICD-10-CM

## 2023-03-02 NOTE — Progress Notes (Signed)
Subjective:   Taylor Wood is a 72 y.o. female who presents for Medicare Annual (Subsequent) preventive examination.  Visit Complete: Virtual I connected with  Lyanne Co on 03/02/23 by a audio enabled telemedicine application and verified that I am speaking with the correct person using two identifiers.  Patient Location: Home  Provider Location: Office/Clinic  I discussed the limitations of evaluation and management by telemedicine. The patient expressed understanding and agreed to proceed.  Vital Signs: Because this visit was a virtual/telehealth visit, some criteria may be missing or patient reported. Any vitals not documented were not able to be obtained and vitals that have been documented are patient reported.  Cardiac Risk Factors include: advanced age (>62men, >37 women);hypertension     Objective:    There were no vitals filed for this visit. There is no height or weight on file to calculate BMI.     03/02/2023    9:04 AM 01/03/2022    9:51 AM 12/29/2020    1:55 PM 09/02/2017    1:10 PM 07/13/2015   10:42 PM 06/07/2014    3:56 PM 03/23/2014    3:49 PM  Advanced Directives  Does Patient Have a Medical Advance Directive? No No No No No No No  Would patient like information on creating a medical advance directive? No - Patient declined  No - Patient declined No - Patient declined No - patient declined information No - patient declined information No - patient declined information    Current Medications (verified) Outpatient Encounter Medications as of 03/02/2023  Medication Sig   atorvastatin (LIPITOR) 10 MG tablet TAKE 1 TABLET BY MOUTH DAILY   baclofen (LIORESAL) 10 MG tablet Take 10 mg by mouth 3 (three) times daily.   carbamazepine (TEGRETOL) 100 MG chewable tablet Chew 300 mg by mouth 2 (two) times daily.   Cholecalciferol (VITAMIN D-3) 1000 UNITS CAPS Take by mouth daily.   lisinopril-hydrochlorothiazide (ZESTORETIC) 10-12.5 MG tablet Take 1 tablet by mouth daily.    Multiple Minerals-Vitamins (CALCIUM & VIT D3 BONE HEALTH PO) Take by mouth daily.   naproxen (NAPROSYN) 500 MG tablet Take 1 tablet (500 mg total) by mouth 2 (two) times daily as needed (for shoulder pain).   pantoprazole (PROTONIX) 40 MG tablet TAKE 1 TABLET BY MOUTH EVERY DAY AT 6 AM   potassium chloride (K-DUR) 10 MEQ tablet TAKE 1 TABLET(10 MEQ) BY MOUTH DAILY   No facility-administered encounter medications on file as of 03/02/2023.    Allergies (verified) Patient has no known allergies.   History: Past Medical History:  Diagnosis Date   HA (headache)    Hypertension    Seizure (HCC)    Viral encephalitis    Viral meningitis    Past Surgical History:  Procedure Laterality Date   ABDOMINAL HYSTERECTOMY     Family History  Problem Relation Age of Onset   Hypertension Mother    Heart disease Mother    Diabetes Mother    Hypertension Father    Alzheimer's disease Father    Diabetes Maternal Grandmother    Hypertension Maternal Aunt    Diabetes Maternal Aunt    Hypertension Sister    Healthy Brother        x3   Healthy Daughter        x4   Seizures Daughter    Healthy Son        x1   Social History   Socioeconomic History   Marital status: Legally Separated    Spouse name:  Not on file   Number of children: Not on file   Years of education: Not on file   Highest education level: Not on file  Occupational History   Not on file  Tobacco Use   Smoking status: Never   Smokeless tobacco: Never  Substance and Sexual Activity   Alcohol use: No    Alcohol/week: 0.0 standard drinks of alcohol   Drug use: No   Sexual activity: Never  Other Topics Concern   Not on file  Social History Narrative   Not on file   Social Determinants of Health   Financial Resource Strain: Low Risk  (03/02/2023)   Overall Financial Resource Strain (CARDIA)    Difficulty of Paying Living Expenses: Not hard at all  Food Insecurity: No Food Insecurity (03/02/2023)   Hunger Vital  Sign    Worried About Running Out of Food in the Last Year: Never true    Ran Out of Food in the Last Year: Never true  Transportation Needs: No Transportation Needs (03/02/2023)   PRAPARE - Administrator, Civil Service (Medical): No    Lack of Transportation (Non-Medical): No  Physical Activity: Inactive (03/02/2023)   Exercise Vital Sign    Days of Exercise per Week: 0 days    Minutes of Exercise per Session: 0 min  Stress: No Stress Concern Present (03/02/2023)   [employer] of Occupational Health - Occupational Stress Questionnaire    Feeling of Stress : Not at all  Social Connections: Socially Isolated (03/02/2023)   Social Connection and Isolation Panel [NHANES]    Frequency of Communication with Friends and Family: More than three times a week    Frequency of Social Gatherings with Friends and Family: More than three times a week    Attends Religious Services: Never    Database administrator or Organizations: No    Attends Engineer, structural: Never    Marital Status: Separated    Tobacco Counseling Counseling given: Not Answered   Clinical Intake:  Pre-visit preparation completed: Yes  Pain : No/denies pain  Nutritional Risks: None Diabetes: No  How often do you need to have someone help you when you read instructions, pamphlets, or other written materials from your doctor or pharmacy?: 1 - Never  Interpreter Needed?: No  Information entered by :: [employer], CMA   Activities of Daily Living    03/02/2023    9:01 AM  In your present state of health, do you have any difficulty performing the following activities:  Hearing? 0  Vision? 0  Difficulty concentrating or making decisions? 0  Walking or climbing stairs? 0  Dressing or bathing? 0  Doing errands, shopping? 1  Comment daughter Insurance claims handler and eating ? N  Using the Toilet? N  In the past six months, have you accidently leaked urine? N  Do you have problems  with loss of bowel control? N  Managing your Medications? N  Managing your Finances? N    Patient Care Team: Saguier, Kateri Mc as PCP - General (Internal Medicine)  Indicate any recent Medical Services you may have received from other than [facility] providers in the past year (date may be approximate).     Assessment:   This is a routine wellness examination for Keniah.  Hearing/Vision screen No results found.   Goals Addressed   None    Depression Screen    03/02/2023    9:08 AM 01/03/2022    9:54 AM  12/29/2020    1:57 PM 12/07/2020    2:13 PM 08/25/2015    8:54 AM 02/21/2013    2:24 PM  PHQ 2/9 Scores  PHQ - 2 Score 0 0 0 0 0 0  Exception Documentation     Patient refusal     Fall Risk    03/02/2023    9:04 AM 01/03/2022    9:52 AM 12/29/2020    1:56 PM 12/07/2020    2:13 PM 12/10/2018    9:07 AM  Fall Risk   Falls in the past year? 0 0 0 0 0  Comment     Emmi Telephone Survey: data to providers prior to load  Number falls in past yr: 0 0 0 0   Injury with Fall? 0 0 0 0   Risk for fall due to : No Fall Risks      Follow up Falls evaluation completed Falls prevention discussed Falls prevention discussed      MEDICARE RISK AT HOME: Medicare Risk at Home Any stairs in or around the home?: Yes If so, are there any without handrails?: No Home free of loose throw rugs in walkways, pet beds, electrical cords, etc?: Yes Adequate lighting in your home to reduce risk of falls?: Yes Life alert?: No Use of a cane, walker or w/c?: No Grab bars in the bathroom?: No Shower chair or bench in shower?: Yes Elevated toilet seat or a handicapped toilet?: No  TIMED UP AND GO:  Was the test performed?  No    Cognitive Function:        03/02/2023    9:10 AM 01/03/2022    9:59 AM  6CIT Screen  What Year? 0 points 0 points  What month? 0 points 0 points  What time? 3 points 0 points  Count back from 20 0 points 0 points  Months in reverse 2 points 4 points  Repeat phrase 2  points 2 points  Total Score 7 points 6 points    Immunizations Immunization History  Administered Date(s) Administered   PFIZER(Purple Top)SARS-COV-2 Vaccination 08/01/2019, 09/18/2019   Tdap 11/24/2016    TDAP status: Up to date  Flu Vaccine status: Declined, Education has been provided regarding the importance of this vaccine but patient still declined. Advised may receive this vaccine at local pharmacy or Health Dept. Aware to provide a copy of the vaccination record if obtained from local pharmacy or Health Dept. Verbalized acceptance and understanding.  Pneumococcal vaccine status: Due, Education has been provided regarding the importance of this vaccine. Advised may receive this vaccine at local pharmacy or Health Dept. Aware to provide a copy of the vaccination record if obtained from local pharmacy or Health Dept. Verbalized acceptance and understanding.  Covid-19 vaccine status: Information provided on how to obtain vaccines.   Qualifies for Shingles Vaccine? Yes   Zostavax completed No   Shingrix Completed?: No.    Education has been provided regarding the importance of this vaccine. Patient has been advised to call insurance company to determine out of pocket expense if they have not yet received this vaccine. Advised may also receive vaccine at local pharmacy or Health Dept. Verbalized acceptance and understanding.  Screening Tests Health Maintenance  Topic Date Due   Hepatitis C Screening  Never done   Zoster Vaccines- Shingrix (1 of 2) Never done   Colonoscopy  Never done   MAMMOGRAM  Never done   Pneumonia Vaccine 49+ Years old (1 of 1 - PCV) Never done  DEXA SCAN  Never done   COVID-19 Vaccine (3 - Pfizer risk series) 10/16/2019   Medicare Annual Wellness (AWV)  01/04/2023   INFLUENZA VACCINE  08/07/2023 (Originally 12/08/2022)   DTaP/Tdap/Td (2 - Td or Tdap) 11/25/2026   HPV VACCINES  Aged Out    Health Maintenance  Health Maintenance Due  Topic Date Due    Hepatitis C Screening  Never done   Zoster Vaccines- Shingrix (1 of 2) Never done   Colonoscopy  Never done   MAMMOGRAM  Never done   Pneumonia Vaccine 74+ Years old (1 of 1 - PCV) Never done   DEXA SCAN  Never done   COVID-19 Vaccine (3 - Pfizer risk series) 10/16/2019   Medicare Annual Wellness (AWV)  01/04/2023    Colorectal Cancer screen: pt declined  Mammogram status: pt declined  Bone Density status: pt declined  Lung Cancer Screening: (Low Dose CT Chest recommended if Age 44-80 years, 20 pack-year currently smoking OR have quit w/in 15years.) does not qualify.   Additional Screening:  Hepatitis C Screening: does qualify; Completed N/a  Vision Screening: Recommended annual ophthalmology exams for early detection of glaucoma and other disorders of the eye. Is the patient up to date with their annual eye exam?  Yes  Who is the provider or what is the name of the office in which the patient attends annual eye exams? Doesn't remember name at this time If pt is not established with a provider, would they like to be referred to a provider to establish care? No .   Dental Screening: Recommended annual dental exams for proper oral hygiene  Diabetic Foot Exam: N/a  Community Resource Referral / Chronic Care Management: CRR required this visit?  No   CCM required this visit?  No     Plan:     I have personally reviewed and noted the following in the patient's chart:   Medical and social history Use of alcohol, tobacco or illicit drugs  Current medications and supplements including opioid prescriptions. Patient is not currently taking opioid prescriptions. Functional ability and status Nutritional status Physical activity Advanced directives List of other physicians Hospitalizations, surgeries, and ER visits in previous 12 months Vitals Screenings to include cognitive, depression, and falls Referrals and appointments  In addition, I have reviewed and discussed with  patient certain preventive protocols, quality metrics, and best practice recommendations. A written personalized care plan for preventive services as well as general preventive health recommendations were provided to patient.     Donne Anon, CMA   03/02/2023   After Visit Summary: (Declined) Due to this being a telephonic visit, with patients personalized plan was offered to patient but patient Declined AVS at this time   Nurse Notes: None

## 2023-03-02 NOTE — Patient Instructions (Signed)
Taylor Wood , Thank you for taking time to come for your Medicare Wellness Visit. I appreciate your ongoing commitment to your health goals. Please review the following plan we discussed and let me know if I can assist you in the future.     This is a list of the screening recommended for you and due dates:  Health Maintenance  Topic Date Due   Hepatitis C Screening  Never done   Zoster (Shingles) Vaccine (1 of 2) Never done   Colon Cancer Screening  Never done   Mammogram  Never done   Pneumonia Vaccine (1 of 1 - PCV) Never done   DEXA scan (bone density measurement)  Never done   COVID-19 Vaccine (3 - Pfizer risk series) 10/16/2019   Flu Shot  08/07/2023*   Medicare Annual Wellness Visit  03/01/2024   DTaP/Tdap/Td vaccine (2 - Td or Tdap) 11/25/2026   HPV Vaccine  Aged Out  *Topic was postponed. The date shown is not the original due date.    Next appointment: Follow up in one year for your annual wellness visit.   Preventive Care 40 Years and Older, Female Preventive care refers to lifestyle choices and visits with your health care provider that can promote health and wellness. What does preventive care include? A yearly physical exam. This is also called an annual well check. Dental exams once or twice a year. Routine eye exams. Ask your health care provider how often you should have your eyes checked. Personal lifestyle choices, including: Daily care of your teeth and gums. Regular physical activity. Eating a healthy diet. Avoiding tobacco and drug use. Limiting alcohol use. Practicing safe sex. Taking low-dose aspirin every day. Taking vitamin and mineral supplements as recommended by your health care provider. What happens during an annual well check? The services and screenings done by your health care provider during your annual well check will depend on your age, overall health, lifestyle risk factors, and family history of disease. Counseling  Your health care  provider may ask you questions about your: Alcohol use. Tobacco use. Drug use. Emotional well-being. Home and relationship well-being. Sexual activity. Eating habits. History of falls. Memory and ability to understand (cognition). Work and work Astronomer. Reproductive health. Screening  You may have the following tests or measurements: Height, weight, and BMI. Blood pressure. Lipid and cholesterol levels. These may be checked every 5 years, or more frequently if you are over 38 years old. Skin check. Lung cancer screening. You may have this screening every year starting at age 72 if you have a 30-pack-year history of smoking and currently smoke or have quit within the past 15 years. Fecal occult blood test (FOBT) of the stool. You may have this test every year starting at age 56. Flexible sigmoidoscopy or colonoscopy. You may have a sigmoidoscopy every 5 years or a colonoscopy every 10 years starting at age 25. Hepatitis C blood test. Hepatitis B blood test. Sexually transmitted disease (STD) testing. Diabetes screening. This is done by checking your blood sugar (glucose) after you have not eaten for a while (fasting). You may have this done every 1-3 years. Bone density scan. This is done to screen for osteoporosis. You may have this done starting at age 44. Mammogram. This may be done every 1-2 years. Talk to your health care provider about how often you should have regular mammograms. Talk with your health care provider about your test results, treatment options, and if necessary, the need for more tests. Vaccines  Your health care provider may recommend certain vaccines, such as: Influenza vaccine. This is recommended every year. Tetanus, diphtheria, and acellular pertussis (Tdap, Td) vaccine. You may need a Td booster every 10 years. Zoster vaccine. You may need this after age 25. Pneumococcal 13-valent conjugate (PCV13) vaccine. One dose is recommended after age  49. Pneumococcal polysaccharide (PPSV23) vaccine. One dose is recommended after age 21. Talk to your health care provider about which screenings and vaccines you need and how often you need them. This information is not intended to replace advice given to you by your health care provider. Make sure you discuss any questions you have with your health care provider. Document Released: 05/22/2015 Document Revised: 01/13/2016 Document Reviewed: 02/24/2015 Elsevier Interactive Patient Education  2017 [employer].  Fall Prevention in the Home Falls can cause injuries. They can happen to people of all ages. There are many things you can do to make your home safe and to help prevent falls. What can I do on the outside of my home? Regularly fix the edges of walkways and driveways and fix any cracks. Remove anything that might make you trip as you walk through a door, such as a raised step or threshold. Trim any bushes or trees on the path to your home. Use bright outdoor lighting. Clear any walking paths of anything that might make someone trip, such as rocks or tools. Regularly check to see if handrails are loose or broken. Make sure that both sides of any steps have handrails. Any raised decks and porches should have guardrails on the edges. Have any leaves, snow, or ice cleared regularly. Use sand or salt on walking paths during winter. Clean up any spills in your garage right away. This includes oil or grease spills. What can I do in the bathroom? Use night lights. Install grab bars by the toilet and in the tub and shower. Do not use towel bars as grab bars. Use non-skid mats or decals in the tub or shower. If you need to sit down in the shower, use a plastic, non-slip stool. Keep the floor dry. Clean up any water that spills on the floor as soon as it happens. Remove soap buildup in the tub or shower regularly. Attach bath mats securely with double-sided non-slip rug tape. Do not have throw  rugs and other things on the floor that can make you trip. What can I do in the bedroom? Use night lights. Make sure that you have a light by your bed that is easy to reach. Do not use any sheets or blankets that are too big for your bed. They should not hang down onto the floor. Have a firm chair that has side arms. You can use this for support while you get dressed. Do not have throw rugs and other things on the floor that can make you trip. What can I do in the kitchen? Clean up any spills right away. Avoid walking on wet floors. Keep items that you use a lot in easy-to-reach places. If you need to reach something above you, use a strong step stool that has a grab bar. Keep electrical cords out of the way. Do not use floor polish or wax that makes floors slippery. If you must use wax, use non-skid floor wax. Do not have throw rugs and other things on the floor that can make you trip. What can I do with my stairs? Do not leave any items on the stairs. Make sure that there are  handrails on both sides of the stairs and use them. Fix handrails that are broken or loose. Make sure that handrails are as long as the stairways. Check any carpeting to make sure that it is firmly attached to the stairs. Fix any carpet that is loose or worn. Avoid having throw rugs at the top or bottom of the stairs. If you do have throw rugs, attach them to the floor with carpet tape. Make sure that you have a light switch at the top of the stairs and the bottom of the stairs. If you do not have them, ask someone to add them for you. What else can I do to help prevent falls? Wear shoes that: Do not have high heels. Have rubber bottoms. Are comfortable and fit you well. Are closed at the toe. Do not wear sandals. If you use a stepladder: Make sure that it is fully opened. Do not climb a closed stepladder. Make sure that both sides of the stepladder are locked into place. Ask someone to hold it for you, if  possible. Clearly mark and make sure that you can see: Any grab bars or handrails. First and last steps. Where the edge of each step is. Use tools that help you move around (mobility aids) if they are needed. These include: Canes. Walkers. Scooters. Crutches. Turn on the lights when you go into a dark area. Replace any light bulbs as soon as they burn out. Set up your furniture so you have a clear path. Avoid moving your furniture around. If any of your floors are uneven, fix them. If there are any pets around you, be aware of where they are. Review your medicines with your doctor. Some medicines can make you feel dizzy. This can increase your chance of falling. Ask your doctor what other things that you can do to help prevent falls. This information is not intended to replace advice given to you by your health care provider. Make sure you discuss any questions you have with your health care provider. Document Released: 02/19/2009 Document Revised: 10/01/2015 Document Reviewed: 05/30/2014 Elsevier Interactive Patient Education  2017 [employer].

## 2023-03-20 ENCOUNTER — Other Ambulatory Visit: Payer: Self-pay | Admitting: Medical

## 2023-03-20 DIAGNOSIS — I10 Essential (primary) hypertension: Secondary | ICD-10-CM

## 2023-08-24 ENCOUNTER — Encounter: Payer: Self-pay | Admitting: Medical

## 2023-08-24 ENCOUNTER — Ambulatory Visit (INDEPENDENT_AMBULATORY_CARE_PROVIDER_SITE_OTHER): Admitting: Medical

## 2023-08-24 VITALS — BP 180/80 | HR 98 | Temp 98.0°F | Resp 18 | Ht 61.0 in | Wt 121.0 lb

## 2023-08-24 DIAGNOSIS — I10 Essential (primary) hypertension: Secondary | ICD-10-CM

## 2023-08-24 DIAGNOSIS — F4323 Adjustment disorder with mixed anxiety and depressed mood: Secondary | ICD-10-CM | POA: Diagnosis not present

## 2023-08-24 DIAGNOSIS — E876 Hypokalemia: Secondary | ICD-10-CM | POA: Diagnosis not present

## 2023-08-24 DIAGNOSIS — R739 Hyperglycemia, unspecified: Secondary | ICD-10-CM

## 2023-08-24 LAB — COMPREHENSIVE METABOLIC PANEL WITH GFR
ALT: 14 U/L (ref 0–35)
AST: 20 U/L (ref 0–37)
Albumin: 5.2 g/dL (ref 3.5–5.2)
Alkaline Phosphatase: 65 U/L (ref 39–117)
BUN: 12 mg/dL (ref 6–23)
CO2: 27 meq/L (ref 19–32)
Calcium: 10.1 mg/dL (ref 8.4–10.5)
Chloride: 100 meq/L (ref 96–112)
Creatinine, Ser: 0.59 mg/dL (ref 0.40–1.20)
GFR: 91.25 mL/min (ref >60.00)
Glucose, Bld: 139 mg/dL — ABNORMAL HIGH (ref 70–99)
Potassium: 4 meq/L (ref 3.5–5.1)
Sodium: 137 meq/L (ref 135–145)
Total Bilirubin: 0.3 mg/dL (ref 0.2–1.2)
Total Protein: 8.1 g/dL (ref 6.0–8.3)

## 2023-08-24 LAB — HEMOGLOBIN A1C: Hgb A1c MFr Bld: 6 % (ref 4.6–6.5)

## 2023-08-24 MED ORDER — VALSARTAN 320 MG PO TABS: 320.0000 mg | ORAL_TABLET | Freq: Every day | ORAL | 11 refills | Status: AC

## 2023-08-24 NOTE — Progress Notes (Signed)
 Subjective:    Patient ID: Taylor Wood, female    DOB: 14-Jul-1950, 73 y.o.   MRN: [id number]  HPI Discussed the use of AI scribe software for clinical note transcription with the patient, who gave verbal consent to proceed.  History of Present Illness   Taylor Wood is a 73 year old female with neuromyelitis optica and hypertension who presents for evaluation of low potassium levels.  A potassium level of 3.1 mEq/L was identified on August 03, 2023, during a visit with her neurologist. She did not receive any potassium supplementation at that time by neurologist. On August 09, 2023, she was advised via phone to see her current provider for further evaluation. She has been taking over-the-counter potassium supplements daily for the past nine days, but she is unsure of the dosage.  She has a history of neuromyelitis optica and is on specialist medications for this condition. A recent metabolic panel was abnormal, and her blood pressure was noted to be elevated during a recent specialist visit.  She has a history of hypertension and was previously prescribed lisinopril  10 mg with hydrochlorothiazide  12.5 mg. She has a few pills left and has taken them yesterday and today(though this is not clear as back in october 24 she only had 30 tab rx and she has not been in our office for about 2 years). Her prescription was last filled on March 02, 2023, with a 30-day supply, and she has not had it refilled since then.  She is experiencing significant stress due to recent family losses, including the death of her sister last 03-Oct-2023 and her niece last month. She feels 'a little' depressed but has never been on depression medication before. She describes her mood as more anxious than depressed. No muscle cramps.        Review of Systems  Constitutional:  Negative for chills, fatigue and fever.  HENT:  Negative for dental problem.   Respiratory:  Negative for choking, shortness of breath and wheezing.    Cardiovascular:  Negative for chest pain and palpitations.  Gastrointestinal:  Negative for abdominal pain.  Genitourinary:  Negative for dysuria.  Musculoskeletal:  Negative for back pain and myalgias.  Skin:  Negative for rash.  Neurological:  Negative for dizziness, weakness and numbness.  Hematological:  Negative for adenopathy. Does not bruise/bleed easily.  Psychiatric/Behavioral:  Positive for dysphoric mood. Negative for behavioral problems, decreased concentration, sleep disturbance and suicidal ideas. The patient is nervous/anxious.    Past Medical History:  Diagnosis Date   HA (headache)    Hypertension    Seizure (HCC)    Viral encephalitis    Viral meningitis      Social History   Socioeconomic History   Marital status: Legally Separated    Spouse name: Not on file   Number of children: Not on file   Years of education: Not on file   Highest education level: Not on file  Occupational History   Not on file  Tobacco Use   Smoking status: Never   Smokeless tobacco: Never  Substance and Sexual Activity   Alcohol use: No    Alcohol/week: 0.0 standard drinks of alcohol   Drug use: No   Sexual activity: Never  Other Topics Concern   Not on file  Social History Narrative   Not on file   Social Drivers of Health   Financial Resource Strain: Low Risk  (03/02/2023)   Overall Financial Resource Strain (CARDIA)    Difficulty of Paying Living Expenses: Not  hard at all  Food Insecurity: No Food Insecurity (03/02/2023)   Hunger Vital Sign    Worried About Running Out of Food in the Last Year: Never true    Ran Out of Food in the Last Year: Never true  Transportation Needs: No Transportation Needs (03/02/2023)   PRAPARE - Administrator, Civil Service (Medical): No    Lack of Transportation (Non-Medical): No  Physical Activity: Inactive (03/02/2023)   Exercise Vital Sign    Days of Exercise per Week: 0 days    Minutes of Exercise per Session: 0 min   Stress: No Stress Concern Present (03/02/2023)   [employer] of Occupational Health - Occupational Stress Questionnaire    Feeling of Stress : Not at all  Social Connections: Socially Isolated (03/02/2023)   Social Connection and Isolation Panel [NHANES]    Frequency of Communication with Friends and Family: More than three times a week    Frequency of Social Gatherings with Friends and Family: More than three times a week    Attends Religious Services: Never    Database administrator or Organizations: No    Attends Banker Meetings: Never    Marital Status: Separated  Intimate Partner Violence: Not At Risk (03/02/2023)   Humiliation, Afraid, Rape, and Kick questionnaire    Fear of Current or Ex-Partner: No    Emotionally Abused: No    Physically Abused: No    Sexually Abused: No    Past Surgical History:  Procedure Laterality Date   ABDOMINAL HYSTERECTOMY      Family History  Problem Relation Age of Onset   Hypertension Mother    Heart disease Mother    Diabetes Mother    Hypertension Father    Alzheimer's disease Father    Diabetes Maternal Grandmother    Hypertension Maternal Aunt    Diabetes Maternal Aunt    Hypertension Sister    Healthy Brother        x3   Healthy Daughter        x4   Seizures Daughter    Healthy Son        x1    No Known Allergies  Current Outpatient Medications on File Prior to Visit  Medication Sig Dispense Refill   atorvastatin  (LIPITOR) 10 MG tablet TAKE 1 TABLET BY MOUTH DAILY 30 tablet 3   carbamazepine  (TEGRETOL ) 100 MG chewable tablet Chew 300 mg by mouth 2 (two) times daily.     Cholecalciferol (VITAMIN D-3) 1000 UNITS CAPS Take by mouth daily.     Multiple Minerals-Vitamins (CALCIUM  & VIT D3 BONE HEALTH PO) Take by mouth daily.     naproxen  (NAPROSYN ) 500 MG tablet Take 1 tablet (500 mg total) by mouth 2 (two) times daily as needed (for shoulder pain). 20 tablet 0   pantoprazole  (PROTONIX ) 40 MG tablet TAKE 1  TABLET BY MOUTH EVERY DAY AT 6 AM 90 tablet 1   potassium chloride  (K-DUR) 10 MEQ tablet TAKE 1 TABLET(10 MEQ) BY MOUTH DAILY 90 tablet 0   No current facility-administered medications on file prior to visit.    BP (!) 180/80   Pulse 98   Temp 98 F (36.7 C)   Resp 18   Ht 5\' 1"  (1.549 m)   Wt 121 lb (54.9 kg)   SpO2 98%   BMI 22.86 kg/m        Objective:   Physical Exam  General Mental Status- Alert. General Appearance- Not in acute distress.  Skin General: Color- Normal Color. Moisture- Normal Moisture.  Neck Carotid Arteries- Normal color. Moisture- Normal Moisture. No carotid bruits. No JVD.  Chest and Lung Exam Auscultation: Breath Sounds:-Normal.  Cardiovascular Auscultation:Rythm- Regular. Murmurs & Other Heart Sounds:Auscultation of the heart reveals- No Murmurs.  Abdomen Inspection:-Inspeection Normal. Palpation/Percussion:Note:No mass. Palpation and Percussion of the abdomen reveal- Non Tender, Non Distended + BS, no rebound or guarding.    Neurologic Cranial Nerve exam:- CN III-XII intact(No nystagmus), symmetric smile. Strength:- 5/5 equal and symmetric strength both upper and lower extremities.       Assessment & Plan:  [phone] Satira Curet [phone] darcie  Patient Instructions  Hypertension Blood pressure elevated. Non-compliance with lisinopril -hctz since March 02, 2023. Stress may contribute. Valsartan  chosen for potency and no diuretic effect. - Prescribe valsartan . 320 mg daily - Discontinue lisinopril  HCTZ. - Schedule follow-up in one week to reassess blood pressure. - Instruct to monitor for symptoms such as headaches, nausea, vomiting, blurred vision, and weakness. if those occur then ED evaluation.  Hypokalemia Potassium level 3.1 mEq/L on March 27. Moderate hypokalemia. Taking OTC potassium supplements for ten days. Metabolic panel needed to reassess levels. - Order metabolic panel stat to assess current potassium levels.  Stat order - Reassess potassium supplementation based on metabolic panel results.  Anxiety and Depression Mild depression and  more anxiety after recent family loss. Sertraline  suggested for mood management. phq9 -3 Gad 7- 9 - Prescribe sertraline  25 mg once daily. - Provide a questionnaire to assess the severity of anxiety and depression.  Neuromyelitis Optica Under specialist care. Continues specialist medications. Monitoring with CBC, metabolic panel, and B12 levels recommended. - Continue specialist medications for neuromyelitis optica. - Order CBC, metabolic panel, and B12 levels as recommended by the specialist.  Follow up one week or sooner if needed

## 2023-08-24 NOTE — Patient Instructions (Signed)
 Hypertension Blood pressure elevated. Non-compliance with lisinopril-hctz since March 02, 2023. Stress may contribute. Valsartan chosen for potency and no diuretic effect. - Prescribe valsartan. 320 mg daily - Discontinue lisinopril HCTZ. - Schedule follow-up in one week to reassess blood pressure. - Instruct to monitor for symptoms such as headaches, nausea, vomiting, blurred vision, and weakness. if those occur then ED evaluation.  Hypokalemia Potassium level 3.1 mEq/L on March 27. Moderate hypokalemia. Taking OTC potassium supplements for ten days. Metabolic panel needed to reassess levels. - Order metabolic panel stat to assess current potassium levels. Stat order - Reassess potassium supplementation based on metabolic panel results.  Anxiety and Depression Mild depression and  more anxiety after recent family loss. Sertraline suggested for mood management. phq9 -3 Gad 7- 9 - Prescribe sertraline 25 mg once daily. - Provide a questionnaire to assess the severity of anxiety and depression.  Neuromyelitis Optica Under specialist care. Continues specialist medications. Monitoring with CBC, metabolic panel, and B12 levels recommended. - Continue specialist medications for neuromyelitis optica. - Order CBC, metabolic panel, and B12 levels as recommended by the specialist.  Follow up one week or sooner if needed

## 2023-08-28 ENCOUNTER — Ambulatory Visit: Admitting: Medical

## 2023-08-29 ENCOUNTER — Encounter: Payer: Self-pay | Admitting: Medical

## 2023-08-29 ENCOUNTER — Other Ambulatory Visit: Payer: Self-pay | Admitting: Medical

## 2023-08-29 ENCOUNTER — Ambulatory Visit (INDEPENDENT_AMBULATORY_CARE_PROVIDER_SITE_OTHER): Admitting: Medical

## 2023-08-29 VITALS — BP 185/85 | HR 88 | Temp 98.5°F | Resp 18 | Ht 61.0 in | Wt 123.4 lb

## 2023-08-29 DIAGNOSIS — I10 Essential (primary) hypertension: Secondary | ICD-10-CM | POA: Diagnosis not present

## 2023-08-29 DIAGNOSIS — F4323 Adjustment disorder with mixed anxiety and depressed mood: Secondary | ICD-10-CM

## 2023-08-29 MED ORDER — SERTRALINE HCL 25 MG PO TABS: 25.0000 mg | ORAL_TABLET | Freq: Every day | ORAL | 0 refills | Status: DC

## 2023-08-29 MED ORDER — HYDRALAZINE HCL 25 MG PO TABS: 25.0000 mg | ORAL_TABLET | Freq: Three times a day (TID) | ORAL | 0 refills | Status: DC

## 2023-08-29 NOTE — Progress Notes (Signed)
 Subjective:    Patient ID: Taylor Wood, female    DOB: 02/27/1951, 73 y.o.   MRN: [id number]  HPI Discussed the use of AI scribe software for clinical note transcription with the patient, who gave verbal consent to proceed.  History of Present Illness   Taylor Wood is a 73 year old female with hypertension who presents for a follow-up regarding blood pressure management.  Her blood pressure was previously recorded at 180/80 mmHg, leading to an adjustment in her medication regimen. Lisinopril  with HCTZ was discontinued, and she was started on  high dose valsartan  320 mg daily. She has not been checking her blood pressure daily.  She took valsartan  320 mg at 6:00 AM today. No symptoms such as headache, dizziness, blurred vision, slurred speech, nausea, or vomiting. She confirms compliance with her medication regimen.  Her potassium level is 4.0 mmol/L, which is within the normal range. She is not currently taking a diuretic or potassium supplement.  She has a history of anxiety and depression, for which sertraline  25 mg daily was prescribed, but she is unsure if she received the medication.        Review of Systems  Constitutional:  Negative for chills, fatigue and fever.  Respiratory:  Negative for chest tightness, shortness of breath and wheezing.   Cardiovascular:  Negative for chest pain and palpitations.  Gastrointestinal:  Negative for abdominal pain.  Musculoskeletal:  Negative for back pain.  Neurological:  Negative for dizziness, facial asymmetry, speech difficulty, weakness, light-headedness and numbness.  Hematological:  Negative for adenopathy. Does not bruise/bleed easily.  Psychiatric/Behavioral:  Positive for dysphoric mood. Negative for behavioral problems, confusion, sleep disturbance and suicidal ideas. The patient is nervous/anxious.     Past Medical History:  Diagnosis Date   HA (headache)    Hypertension    Seizure (HCC)    Viral encephalitis    Viral meningitis       Social History   Socioeconomic History   Marital status: Legally Separated    Spouse name: Not on file   Number of children: Not on file   Years of education: Not on file   Highest education level: Not on file  Occupational History   Not on file  Tobacco Use   Smoking status: Never   Smokeless tobacco: Never  Substance and Sexual Activity   Alcohol use: No    Alcohol/week: 0.0 standard drinks of alcohol   Drug use: No   Sexual activity: Never  Other Topics Concern   Not on file  Social History Narrative   Not on file   Social Drivers of Health   Financial Resource Strain: Low Risk  (03/02/2023)   Overall Financial Resource Strain (CARDIA)    Difficulty of Paying Living Expenses: Not hard at all  Food Insecurity: No Food Insecurity (03/02/2023)   Hunger Vital Sign    Worried About Running Out of Food in the Last Year: Never true    Ran Out of Food in the Last Year: Never true  Transportation Needs: No Transportation Needs (03/02/2023)   PRAPARE - Administrator, Civil Service (Medical): No    Lack of Transportation (Non-Medical): No  Physical Activity: Inactive (03/02/2023)   Exercise Vital Sign    Days of Exercise per Week: 0 days    Minutes of Exercise per Session: 0 min  Stress: No Stress Concern Present (03/02/2023)   [employer] of Occupational Health - Occupational Stress Questionnaire    Feeling of Stress : Not at  all  Social Connections: Socially Isolated (03/02/2023)   Social Connection and Isolation Panel [NHANES]    Frequency of Communication with Friends and Family: More than three times a week    Frequency of Social Gatherings with Friends and Family: More than three times a week    Attends Religious Services: Never    Database administrator or Organizations: No    Attends Banker Meetings: Never    Marital Status: Separated  Intimate Partner Violence: Not At Risk (03/02/2023)   Humiliation, Afraid, Rape, and Kick  questionnaire    Fear of Current or Ex-Partner: No    Emotionally Abused: No    Physically Abused: No    Sexually Abused: No    Past Surgical History:  Procedure Laterality Date   ABDOMINAL HYSTERECTOMY      Family History  Problem Relation Age of Onset   Hypertension Mother    Heart disease Mother    Diabetes Mother    Hypertension Father    Alzheimer's disease Father    Diabetes Maternal Grandmother    Hypertension Maternal Aunt    Diabetes Maternal Aunt    Hypertension Sister    Healthy Brother        x3   Healthy Daughter        x4   Seizures Daughter    Healthy Son        x1    No Known Allergies  Current Outpatient Medications on File Prior to Visit  Medication Sig Dispense Refill   atorvastatin  (LIPITOR) 10 MG tablet TAKE 1 TABLET BY MOUTH DAILY 30 tablet 3   carbamazepine  (TEGRETOL ) 100 MG chewable tablet Chew 300 mg by mouth 2 (two) times daily.     Cholecalciferol (VITAMIN D-3) 1000 UNITS CAPS Take by mouth daily.     Multiple Minerals-Vitamins (CALCIUM  & VIT D3 BONE HEALTH PO) Take by mouth daily.     pantoprazole  (PROTONIX ) 40 MG tablet TAKE 1 TABLET BY MOUTH EVERY DAY AT 6 AM 90 tablet 1   potassium chloride  (K-DUR) 10 MEQ tablet TAKE 1 TABLET(10 MEQ) BY MOUTH DAILY 90 tablet 0   valsartan  (DIOVAN ) 320 MG tablet Take 1 tablet (320 mg total) by mouth daily. 30 tablet 11   No current facility-administered medications on file prior to visit.    BP (!) 185/85 Comment: 180/80 1st time.  Pulse 88   Temp 98.5 F (36.9 C)   Resp 18   Ht 5\' 1"  (1.549 m)   Wt 123 lb 6.4 oz (56 kg)   SpO2 96%   BMI 23.32 kg/m        Objective:   Physical Exam  General Mental Status- Alert. General Appearance- Not in acute distress.   Skin General: Color- Normal Color. Moisture- Normal Moisture.  Neck Carotid Arteries- Normal color. Moisture- Normal Moisture. No carotid bruits. No JVD.  Chest and Lung Exam Auscultation: Breath  Sounds:-Normal.  Cardiovascular Auscultation:Rythm- Regular. Murmurs & Other Heart Sounds:Auscultation of the heart reveals- No Murmurs.  Abdomen Inspection:-Inspeection Normal. Palpation/Percussion:Note:No mass. Palpation and Percussion of the abdomen reveal- Non Tender, Non Distended + BS, no rebound or guarding.   Neurologic Cranial Nerve exam:- CN III-XII intact(No nystagmus), symmetric smile. Drift Test:- No drift. Finger to Nose:- Normal/Intact Strength:- 5/5 equal and symmetric strength both upper and lower extremities.       Assessment & Plan:   Assessment and Plan    Hypertension Hypertension remains uncontrolled with valsartan . Blood pressure elevated, nearing hypertensive urgency. Potassium  levels normal. Hydralazine  added to regimen for better control. - Prescribe hydralazine  25 mg three times a day. - Instruct to take two doses of hydralazine  today, then three times a day starting tomorrow. - Monitor blood pressure with a new machine and send an update on Thursday. - Advise to seek emergency care if experiencing vision changes, nausea, vomiting, headache, or weakness in arms or legs. - Schedule follow-up visit within 7-10 days.  Depression and anxiety Depression and anxiety persist. Initiated sertraline  25 mg once a day to manage symptoms. - Prescribe sertraline  25 mg once a day.        Nial Hawe, PA-C

## 2023-08-29 NOTE — Patient Instructions (Signed)
 Hypertension Hypertension remains uncontrolled with valsartan . Blood pressure elevated, nearing hypertensive urgency. Potassium levels normal. Hydralazine  added to regimen for better control. - Prescribe hydralazine  25 mg three times a day. - Instruct to take two doses of hydralazine  today, then three times a day starting tomorrow. - Monitor blood pressure with a new machine and send an update on Thursday. - Advise to seek emergency care if experiencing vision changes, nausea, vomiting, headache, or weakness in arms or legs. - Schedule follow-up visit within 7-10 days.  Depression and anxiety Depression and anxiety persist. Initiated sertraline  25 mg once a day to manage symptoms. - Prescribe sertraline  25 mg once a day.

## 2023-09-26 ENCOUNTER — Other Ambulatory Visit: Payer: Self-pay | Admitting: Medical

## 2023-10-26 ENCOUNTER — Other Ambulatory Visit: Payer: Self-pay | Admitting: Medical

## 2023-11-29 ENCOUNTER — Other Ambulatory Visit: Payer: Self-pay | Admitting: Medical

## 2024-03-05 ENCOUNTER — Ambulatory Visit

## 2024-03-21 ENCOUNTER — Ambulatory Visit

## 2024-03-21 VITALS — BP 144/86 | HR 95 | Temp 98.7°F | Ht 61.0 in | Wt 130.6 lb

## 2024-03-21 DIAGNOSIS — Z0000 Encounter for general adult medical examination without abnormal findings: Secondary | ICD-10-CM | POA: Diagnosis not present

## 2024-03-21 NOTE — Progress Notes (Signed)
 Chief Complaint  Patient presents with   Medicare Wellness     Subjective:   Taylor Wood is a 73 y.o. female who presents for a Medicare Annual Wellness Visit.  Allergies (verified) Patient has no known allergies.   History: Past Medical History:  Diagnosis Date   HA (headache)    Hypertension    Seizure (HCC)    Viral encephalitis    Viral meningitis    Past Surgical History:  Procedure Laterality Date   ABDOMINAL HYSTERECTOMY     Family History  Problem Relation Age of Onset   Hypertension Mother    Heart disease Mother    Diabetes Mother    Hypertension Father    Alzheimer's disease Father    Diabetes Maternal Grandmother    Hypertension Maternal Aunt    Diabetes Maternal Aunt    Hypertension Sister    Healthy Brother        x3   Healthy Daughter        x4   Seizures Daughter    Healthy Son        x1   Social History   Occupational History   Not on file  Tobacco Use   Smoking status: Never   Smokeless tobacco: Never  Substance and Sexual Activity   Alcohol use: No    Alcohol/week: 0.0 standard drinks of alcohol   Drug use: No   Sexual activity: Never   Tobacco Counseling Counseling given: Not Answered  SDOH Screenings   Food Insecurity: No Food Insecurity (03/21/2024)  Housing: Unknown (03/21/2024)  Transportation Needs: No Transportation Needs (03/21/2024)  Utilities: Not At Risk (03/21/2024)  Alcohol Screen: Low Risk  (03/02/2023)  Depression (PHQ2-9): Low Risk  (03/21/2024)  Financial Resource Strain: Low Risk  (03/02/2023)  Physical Activity: Inactive (03/21/2024)  Social Connections: Socially Isolated (03/21/2024)  Stress: No Stress Concern Present (03/21/2024)  Tobacco Use: Low Risk  (03/21/2024)  Health Literacy: Adequate Health Literacy (03/21/2024)   See flowsheets for full screening details  Depression Screen PHQ 2 & 9 Depression Scale- Over the past 2 weeks, how often have you been bothered by any of the following  problems? Little interest or pleasure in doing things: 0 Feeling down, depressed, or hopeless (PHQ Adolescent also includes...irritable): 0 PHQ-2 Total Score: 0 Trouble falling or staying asleep, or sleeping too much: 0 Feeling tired or having little energy: 0 Poor appetite or overeating (PHQ Adolescent also includes...weight loss): 0 Feeling bad about yourself - or that you are a failure or have let yourself or your family down: 0 Trouble concentrating on things, such as reading the newspaper or watching television (PHQ Adolescent also includes...like school work): 0 Moving or speaking so slowly that other people could have noticed. Or the opposite - being so fidgety or restless that you have been moving around a lot more than usual: 0 Thoughts that you would be better off dead, or of hurting yourself in some way: 0 PHQ-9 Total Score: 3 If you checked off any problems, how difficult have these problems made it for you to do your work, take care of things at home, or get along with other people?: Somewhat difficult     Goals Addressed               This Visit's Progress     Increase physical activity (pt-stated)        Get more active       Visit info / Clinical Intake: Medicare Wellness Visit Type:: Subsequent  Annual Wellness Visit Persons participating in visit:: patient (Daughter with patient) Medicare Wellness Visit Mode:: In-person (required for WTM) Information given by:: patient Interpreter Needed?: No Pre-visit prep was completed: no AWV questionnaire completed by patient prior to visit?: no Living arrangements:: with family/others (Lives with son) Patient's Overall Health Status Rating: good Typical amount of pain: none Does pain affect daily life?: no Are you currently prescribed opioids?: no  Dietary Habits and Nutritional Risks How many meals a day?: 3 Eats fruit and vegetables daily?: yes Most meals are obtained by: preparing own meals In the last 2 weeks,  have you had any of the following?: none Diabetic:: no  Functional Status Activities of Daily Living (to include ambulation/medication): Independent Ambulation: Independent with device- listed below Home Assistive Devices/Equipment: Eyeglasses Medication Administration: Independent Home Management: Independent Manage your own finances?: yes Primary transportation is: family/friends Concerns about vision?: no *vision screening is required for WTM* Concerns about hearing?: no  Fall Screening Falls in the past year?: 0 Number of falls in past year: 1 Was there an injury with Fall?: 0 Fall Risk Category Calculator: 1 Patient Fall Risk Level: Low Fall Risk  Fall Risk Patient at Risk for Falls Due to: No Fall Risks  Home and Transportation Safety: All rugs have non-skid backing?: N/A, no rugs All stairs or steps have railings?: yes Grab bars in the bathtub or shower?: (!) no Have non-skid surface in bathtub or shower?: yes Good home lighting?: yes Regular seat belt use?: yes Hospital stays in the last year:: no  Cognitive Assessment Difficulty concentrating, remembering, or making decisions? : no Will 6CIT or Mini Cog be Completed: no 6CIT or Mini Cog Declined: patient alert, oriented, able to answer questions appropriately and recall recent events  Advance Directives (For Healthcare) Does Patient Have a Medical Advance Directive?: No Would patient like information on creating a medical advance directive?: No - Patient declined  Reviewed/Updated  Reviewed/Updated: Reviewed All (Medical, Surgical, Family, Medications, Allergies, Care Teams, Patient Goals)        Objective:    Today's Vitals   03/21/24 1033  BP: (!) 144/86  Pulse: 95  Temp: 98.7 F (37.1 C)  TempSrc: Oral  SpO2: 96%  Weight: 130 lb 9.6 oz (59.2 kg)  Height: 5' 1 (1.549 m)   Body mass index is 24.68 kg/m.  Current Medications (verified) Outpatient Encounter Medications as of 03/21/2024   Medication Sig   atorvastatin  (LIPITOR) 10 MG tablet TAKE 1 TABLET BY MOUTH DAILY   carbamazepine  (TEGRETOL ) 100 MG chewable tablet Chew 300 mg by mouth 2 (two) times daily.   Cholecalciferol (VITAMIN D-3) 1000 UNITS CAPS Take by mouth daily.   hydrALAZINE  (APRESOLINE ) 25 MG tablet TAKE 1 TABLET(25 MG) BY MOUTH THREE TIMES DAILY   Multiple Minerals-Vitamins (CALCIUM  & VIT D3 BONE HEALTH PO) Take by mouth daily.   pantoprazole  (PROTONIX ) 40 MG tablet TAKE 1 TABLET BY MOUTH EVERY DAY AT 6 AM   potassium chloride  (K-DUR) 10 MEQ tablet TAKE 1 TABLET(10 MEQ) BY MOUTH DAILY   sertraline  (ZOLOFT ) 25 MG tablet TAKE 1 TABLET(25 MG) BY MOUTH DAILY   valsartan  (DIOVAN ) 320 MG tablet Take 1 tablet (320 mg total) by mouth daily.   No facility-administered encounter medications on file as of 03/21/2024.   Hearing/Vision screen Hearing Screening - Comments:: Denies hearing difficulties   Vision Screening - Comments:: Wears rx glasses - up to date with routine eye exams with  [facility] Immunizations and Health Maintenance Health Maintenance  Topic Date  Due   Hepatitis C Screening  Never done   Zoster Vaccines- Shingrix (1 of 2) Never done   Mammogram  Never done   Colonoscopy  Never done   Pneumococcal Vaccine: 50+ Years (1 of 1 - PCV) Never done   DEXA SCAN  Never done   COVID-19 Vaccine (3 - Pfizer risk series) 10/16/2019   Influenza Vaccine  Never done   Medicare Annual Wellness (AWV)  03/21/2025   DTaP/Tdap/Td (2 - Td or Tdap) 11/25/2026   Meningococcal B Vaccine  Aged Out        Assessment/Plan:  This is a routine wellness examination for Kapri.  Patient Care Team: Saguier, Edward, PA-C as PCP - General (Internal Medicine)  I have personally reviewed and noted the following in the patient's chart:   Medical and social history Use of alcohol, tobacco or illicit drugs  Current medications and supplements including opioid prescriptions. Functional ability and status Nutritional  status Physical activity Advanced directives List of other physicians Hospitalizations, surgeries, and ER visits in previous 12 months Vitals Screenings to include cognitive, depression, and falls Referrals and appointments  No orders of the defined types were placed in this encounter.  In addition, I have reviewed and discussed with patient certain preventive protocols, quality metrics, and best practice recommendations. A written personalized care plan for preventive services as well as general preventive health recommendations were provided to patient.   Rojelio LELON Blush, LPN   [id number]   Return in 1 year 03/27/25.  After Visit Summary: (In Person-Printed) AVS printed and given to the patient  Nurse Notes: None

## 2024-03-21 NOTE — Patient Instructions (Addendum)
 Taylor Wood,  Thank you for taking the time for your Medicare Wellness Visit. I appreciate your continued commitment to your health goals. Please review the care plan we discussed, and feel free to reach out if I can assist you further.  Please note that Annual Wellness Visits do not include a physical exam. Some assessments may be limited, especially if the visit was conducted virtually. If needed, we may recommend an in-person follow-up with your provider.  Ongoing Care Seeing your primary care provider every 3 to 6 months helps us  monitor your health and provide consistent, personalized care.    Referrals If a referral was made during today's visit and you haven't received any updates within two weeks, please contact the referred provider directly to check on the status.  Recommended Screenings:  Health Maintenance  Topic Date Due   Hepatitis C Screening  Never done   Zoster (Shingles) Vaccine (1 of 2) Never done   Breast Cancer Screening  Never done   Colon Cancer Screening  Never done   Pneumococcal Vaccine for age over 107 (1 of 1 - PCV) Never done   DEXA scan (bone density measurement)  Never done   COVID-19 Vaccine (3 - Pfizer risk series) 10/16/2019   Flu Shot  Never done   Medicare Annual Wellness Visit  03/21/2025   DTaP/Tdap/Td vaccine (2 - Td or Tdap) 11/25/2026   Meningitis B Vaccine  Aged Out       03/21/2024   10:41 AM  Advanced Directives  Does Patient Have a Medical Advance Directive? No  Would patient like information on creating a medical advance directive? No - Patient declined    Vision: Annual vision screenings are recommended for early detection of glaucoma, cataracts, and diabetic retinopathy. These exams can also reveal signs of chronic conditions such as diabetes and high blood pressure.  Dental: Annual dental screenings help detect early signs of oral cancer, gum disease, and other conditions linked to overall health, including heart disease and  diabetes.  Please see the attached documents for additional preventive care recommendations.

## 2025-03-27 ENCOUNTER — Ambulatory Visit
